# Patient Record
Sex: Female | Born: 1995 | Race: Black or African American | Hispanic: No | Marital: Single | Smoking: Never smoker
Health system: Southern US, Community
[De-identification: ages and names within clinical notes are randomized; demographics above are authoritative.]

## PROBLEM LIST (undated history)

## (undated) ENCOUNTER — Emergency Department (HOSPITAL_COMMUNITY): Admission: EM | Discharge status: Absent without leave | Payer: Self-pay

## (undated) ENCOUNTER — Inpatient Hospital Stay (HOSPITAL_COMMUNITY): Payer: Self-pay

## (undated) DIAGNOSIS — B999 Unspecified infectious disease: Secondary | ICD-10-CM

## (undated) DIAGNOSIS — L309 Dermatitis, unspecified: Secondary | ICD-10-CM

## (undated) DIAGNOSIS — A749 Chlamydial infection, unspecified: Secondary | ICD-10-CM

## (undated) DIAGNOSIS — F329 Major depressive disorder, single episode, unspecified: Secondary | ICD-10-CM

## (undated) DIAGNOSIS — R51 Headache: Principal | ICD-10-CM

## (undated) DIAGNOSIS — E0500 Thyrotoxicosis with diffuse goiter without thyrotoxic crisis or storm: Secondary | ICD-10-CM

## (undated) DIAGNOSIS — F32A Depression, unspecified: Secondary | ICD-10-CM

## (undated) DIAGNOSIS — J45909 Unspecified asthma, uncomplicated: Secondary | ICD-10-CM

## (undated) DIAGNOSIS — F419 Anxiety disorder, unspecified: Secondary | ICD-10-CM

## (undated) DIAGNOSIS — R519 Headache, unspecified: Secondary | ICD-10-CM

## (undated) DIAGNOSIS — A6000 Herpesviral infection of urogenital system, unspecified: Secondary | ICD-10-CM

## (undated) HISTORY — DX: Headache: R51

## (undated) HISTORY — PX: OTHER SURGICAL HISTORY: SHX169

## (undated) HISTORY — PX: NO PAST SURGERIES: SHX2092

## (undated) HISTORY — PX: WISDOM TOOTH EXTRACTION: SHX21

## (undated) HISTORY — DX: Headache, unspecified: R51.9

---
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
## 2014-02-17 ENCOUNTER — Telehealth: Payer: Self-pay | Admitting: Genetic Counselor

## 2014-02-17 NOTE — Telephone Encounter (Signed)
LEFT MESSAGE FOR PATIENT TO RETURN CALL TO SCHEDULE GENETIC APPT.  °

## 2014-02-17 NOTE — Telephone Encounter (Signed)
S/W PATIENT AND GVE GENETIC APPT FOR 05/29 @ 10 W/GENETIC COUNSELOR.  WELCOME PACKET MAILED.

## 2014-02-19 ENCOUNTER — Emergency Department (HOSPITAL_COMMUNITY)
Admission: EM | Admit: 2014-02-19 | Discharge: 2014-02-19 | Disposition: A | Discharge status: Home / Self Care | Payer: BC Managed Care – PPO | Attending: Emergency Medicine | Admitting: Emergency Medicine

## 2014-02-19 ENCOUNTER — Encounter (HOSPITAL_COMMUNITY): Payer: Self-pay | Admitting: Emergency Medicine

## 2014-02-19 ENCOUNTER — Emergency Department (HOSPITAL_COMMUNITY): Payer: BC Managed Care – PPO

## 2014-02-19 DIAGNOSIS — J45901 Unspecified asthma with (acute) exacerbation: Secondary | ICD-10-CM | POA: Diagnosis not present

## 2014-02-19 DIAGNOSIS — Z880 Allergy status to penicillin: Secondary | ICD-10-CM | POA: Insufficient documentation

## 2014-02-19 DIAGNOSIS — R0602 Shortness of breath: Secondary | ICD-10-CM | POA: Diagnosis present

## 2014-02-19 HISTORY — DX: Unspecified asthma, uncomplicated: J45.909

## 2014-02-19 LAB — CBC
HEMATOCRIT: 37.6 % (ref 36.0–46.0)
HEMOGLOBIN: 12.8 g/dL (ref 12.0–15.0)
MCH: 27.6 pg (ref 26.0–34.0)
MCHC: 34 g/dL (ref 30.0–36.0)
MCV: 81.2 fL (ref 78.0–100.0)
Platelets: 244 10*3/uL (ref 150–400)
RBC: 4.63 MIL/uL (ref 3.87–5.11)
RDW: 13.3 % (ref 11.5–15.5)
WBC: 5.9 10*3/uL (ref 4.0–10.5)

## 2014-02-19 LAB — BASIC METABOLIC PANEL
BUN: 11 mg/dL (ref 6–23)
CALCIUM: 9.6 mg/dL (ref 8.4–10.5)
CO2: 23 mEq/L (ref 19–32)
Chloride: 104 mEq/L (ref 96–112)
Creatinine, Ser: 0.67 mg/dL (ref 0.50–1.10)
GLUCOSE: 99 mg/dL (ref 70–99)
POTASSIUM: 3.7 meq/L (ref 3.7–5.3)
Sodium: 141 mEq/L (ref 137–147)

## 2014-02-19 LAB — I-STAT TROPONIN, ED: Troponin i, poc: 0 ng/mL (ref 0.00–0.08)

## 2014-02-19 MED ORDER — PREDNISONE 20 MG PO TABS
60.0000 mg | ORAL_TABLET | Freq: Once | ORAL | Status: AC
  Administered 2014-02-19: 60 mg via ORAL
  Filled 2014-02-19: qty 3

## 2014-02-19 MED ORDER — ALBUTEROL SULFATE (2.5 MG/3ML) 0.083% IN NEBU
5.0000 mg | INHALATION_SOLUTION | Freq: Once | RESPIRATORY_TRACT | Status: AC
  Administered 2014-02-19: 5 mg via RESPIRATORY_TRACT
  Filled 2014-02-19: qty 6

## 2014-02-19 MED ORDER — PREDNISONE 10 MG PO TABS: 60.0000 mg | ORAL_TABLET | Freq: Every day | ORAL | Status: DC

## 2014-02-19 MED ORDER — IPRATROPIUM BROMIDE 0.02 % IN SOLN
0.5000 mg | Freq: Once | RESPIRATORY_TRACT | Status: AC
  Administered 2014-02-19: 0.5 mg via RESPIRATORY_TRACT
  Filled 2014-02-19: qty 2.5

## 2014-02-19 MED ORDER — ALBUTEROL SULFATE HFA 108 (90 BASE) MCG/ACT IN AERS
2.0000 | INHALATION_SPRAY | RESPIRATORY_TRACT | Status: DC
  Administered 2014-02-19: 2 via RESPIRATORY_TRACT
  Filled 2014-02-19: qty 6.7

## 2014-02-19 NOTE — Discharge Instructions (Signed)

## 2014-02-19 NOTE — ED Notes (Signed)
Pt reports about 1 hour ago she began to have sob and chest pain. No noted activity at onset. She is alert and breathing easily now

## 2014-02-19 NOTE — ED Provider Notes (Signed)
CSN: [id number]     Arrival date & time 02/19/14  1504 History   First MD Initiated Contact with Patient 02/19/14 1709     Chief Complaint  Patient presents with  . Shortness of Breath      HPI Patient presents to the emergency department with increasing shortness of breath and chest tightness over the past 24-48 hours.  She reports recent coughing congestion.  She denies fevers and chills.  She has a long-standing history of asthma and states this feels like asthma exacerbation.  She does not have a rescue inhaler at home.  She has not received any treatments at this point.  No history of heart disease.  No history of DVT or pulmonary embolism.  No family history of venous thromboembolic disease.   Past Medical History  Diagnosis Date  . Asthma    History reviewed. No pertinent past surgical history. History reviewed. No pertinent family history. History  Substance Use Topics  . Smoking status: Never Smoker   . Smokeless tobacco: Not on file  . Alcohol Use: No   OB History   Grav Para Term Preterm Abortions TAB SAB Ect Mult Living                 Review of Systems  All other systems reviewed and are negative.     Allergies  Amoxicillin  Home Medications   Prior to Admission medications   Medication Sig Start Date End Date Taking? Authorizing Provider  ibuprofen (ADVIL,MOTRIN) 200 MG tablet Take 400 mg by mouth every 6 (six) hours as needed for headache.   Yes Historical Provider, MD   BP 130/90  Pulse 110  Temp(Src) 98.8 F (37.1 C) (Oral)  Resp 16  Ht 5\' 5"  (1.651 m)  Wt 118 lb (53.524 kg)  BMI 19.64 kg/m2  SpO2 100% Physical Exam  Nursing note and vitals reviewed. Constitutional: She is oriented to person, place, and time. She appears well-developed and well-nourished. No distress.  HENT:  Head: Normocephalic and atraumatic.  Eyes: EOM are normal.  Neck: Normal range of motion.  Cardiovascular: Normal rate, regular rhythm and normal heart sounds.    Pulmonary/Chest: Effort normal. She has wheezes.  Abdominal: Soft. She exhibits no distension. There is no tenderness.  Musculoskeletal: Normal range of motion.  Neurological: She is alert and oriented to person, place, and time.  Skin: Skin is warm and dry.  Psychiatric: She has a normal mood and affect. Judgment normal.    ED Course  Procedures (including critical care time) Labs Review Labs Reviewed  CBC  BASIC METABOLIC PANEL  I-STAT TROPOININ, ED    Imaging Review Dg Chest 2 View  02/19/2014   CLINICAL DATA:  Chest pain, short of breath, asthma  EXAM: CHEST  2 VIEW  COMPARISON:  None.  FINDINGS: Diffuse hyperinflation noted. Normal heart size and vascularity. No focal pneumonia, collapse or consolidation. No edema, effusion or pneumothorax. Trachea midline.  IMPRESSION: Hyperinflation.  No acute process.   Electronically Signed   By: Ruel Favorsrevor  Shick M.D.   On: 02/19/2014 15:50  I personally reviewed the imaging tests through PACS system I reviewed available ER/hospitalization records through the EMR    EKG Interpretation None      MDM   Final diagnoses:  None    7:29 PM  Vision feels much better this time.  Discharge home.  Asthma exacerbation.   [name], MD 02/19/14 [phone]

## 2014-02-26 ENCOUNTER — Emergency Department (HOSPITAL_COMMUNITY): Payer: BC Managed Care – PPO

## 2014-02-26 ENCOUNTER — Emergency Department (HOSPITAL_COMMUNITY)
Admission: EM | Admit: 2014-02-26 | Discharge: 2014-02-26 | Disposition: A | Discharge status: Home / Self Care | Payer: BC Managed Care – PPO | Attending: Emergency Medicine | Admitting: Emergency Medicine

## 2014-02-26 ENCOUNTER — Encounter (HOSPITAL_COMMUNITY): Payer: Self-pay | Admitting: Emergency Medicine

## 2014-02-26 DIAGNOSIS — Z880 Allergy status to penicillin: Secondary | ICD-10-CM | POA: Insufficient documentation

## 2014-02-26 DIAGNOSIS — J45909 Unspecified asthma, uncomplicated: Secondary | ICD-10-CM | POA: Insufficient documentation

## 2014-02-26 DIAGNOSIS — Z791 Long term (current) use of non-steroidal anti-inflammatories (NSAID): Secondary | ICD-10-CM | POA: Insufficient documentation

## 2014-02-26 DIAGNOSIS — R51 Headache: Secondary | ICD-10-CM | POA: Insufficient documentation

## 2014-02-26 DIAGNOSIS — IMO0002 Reserved for concepts with insufficient information to code with codable children: Secondary | ICD-10-CM | POA: Insufficient documentation

## 2014-02-26 DIAGNOSIS — R079 Chest pain, unspecified: Secondary | ICD-10-CM | POA: Insufficient documentation

## 2014-02-26 DIAGNOSIS — R519 Headache, unspecified: Secondary | ICD-10-CM

## 2014-02-26 MED ORDER — KETOROLAC TROMETHAMINE 60 MG/2ML IM SOLN
60.0000 mg | Freq: Once | INTRAMUSCULAR | Status: AC
  Administered 2014-02-26: 60 mg via INTRAMUSCULAR
  Filled 2014-02-26: qty 2

## 2014-02-26 MED ORDER — NAPROXEN 500 MG PO TABS: 500.0000 mg | ORAL_TABLET | Freq: Two times a day (BID) | ORAL | Status: DC

## 2014-02-26 MED ORDER — ONDANSETRON 4 MG PO TBDP
4.0000 mg | ORAL_TABLET | Freq: Once | ORAL | Status: AC
  Administered 2014-02-26: 4 mg via ORAL
  Filled 2014-02-26: qty 1

## 2014-02-26 NOTE — ED Provider Notes (Signed)
Date: 02/26/2014  Rate: 78  Rhythm: normal sinus rhythm  QRS Axis: normal  Intervals: normal  ST/T Wave abnormalities: nonspecific T wave changes  Conduction Disutrbances:none  Narrative Interpretation:   Old EKG Reviewed: questionable inferolateralal ischemic t wave changes sen on last tracing have resolved   Doug SouSam Mckinna Demars, MD 02/26/14 1657

## 2014-02-26 NOTE — ED Notes (Signed)
Pt report chest pain and points to area just under left breast and hurts that is worse with deep breath at 7/10. Denies fall or injury. Pt also reports headache x 1 month at 10/10. Pt in NAD. Pt denies SOB and n/v/d.

## 2014-02-26 NOTE — ED Provider Notes (Signed)
Medical screening examination/treatment/procedure(s) were performed by non-physician practitioner and as supervising physician I was immediately available for consultation/collaboration.   EKG Interpretation   Date/Time:  Wednesday February 26 2014 10:09:35 EDT Ventricular Rate:  76 PR Interval:  146 QRS Duration: 80 QT Interval:  360 QTC Calculation: 405 R Axis:   65 Text Interpretation:  Sinus rhythm Baseline wander in lead(s) V3  Nonspecific T wave abnormality seen on last tracing have resolved  Confirmed by Ethelda ChickJACUBOWITZ  MD, Charisma Charlot [phone]) on 02/26/2014 4:55:51 PM       Doug SouSam Tongela Encinas, MD 02/26/14 1657

## 2014-02-26 NOTE — Discharge Instructions (Signed)
Take the prescribed medication as directed, may re-start prednisone as well. Follow-up with [facility] if headaches continue or worsen. Return to the ED for new or worsening symptoms.

## 2014-02-26 NOTE — ED Provider Notes (Signed)
CSN: [id number]     Arrival date & time 02/26/14  0900 History   First MD Initiated Contact with Patient 02/26/14 0915     Chief Complaint  Patient presents with  . Headache  . cheat pain      (Consider location/radiation/quality/duration/timing/severity/associated sxs/prior Treatment) Patient is a 18 y.o. female presenting with headaches. The history is provided by the patient and medical records.  Headache  This is an 18 year old female with past medical history significant for asthma presenting to the ED for chest pain and headache. Patient states headache has been present intermittently for the past month, localized to her left temple and described as a throbbing sensation. She denies any associated photophobia, phonophobia, aura, neck pain, dizziness, tinnitus, visual disturbance, changes in speech, or gait disturbance.  Patient also complains of some chest pain, localized directly beneath her left breast. States this is worse with movement and deep breathing.  No chest wall trauma.  She denies any shortness of breath, cough, congestion, fever, or chills.  No personal or family cardiac history. No recent travel, surgeries, prolonged immobilization, calf pain, LE swelling, or exogenous estrogens. Patient has no prior history of DVT or PE. Patient was seen in the ED 1 week ago for similar symptoms, was started on prednisone but states she thinks it made her headache worse so she has stopped taking it.  She has not taken any medications for her headache thus far.  VS stable on arrival.  Past Medical History  Diagnosis Date  . Asthma    History reviewed. No pertinent past surgical history. No family history on file. History  Substance Use Topics  . Smoking status: Never Smoker   . Smokeless tobacco: Not on file  . Alcohol Use: No   OB History   Grav Para Term Preterm Abortions TAB SAB Ect Mult Living                 Review of Systems  Cardiovascular: Positive for chest pain.    Neurological: Positive for headaches.  All other systems reviewed and are negative.     Allergies  Amoxicillin  Home Medications   Prior to Admission medications   Medication Sig Start Date End Date Taking? Authorizing Provider  ibuprofen (ADVIL,MOTRIN) 200 MG tablet Take 400 mg by mouth every 6 (six) hours as needed for headache.    Historical Provider, MD  predniSONE (DELTASONE) 10 MG tablet Take 6 tablets (60 mg total) by mouth daily. 02/19/14   Lyanne CoKevin M Campos, MD   BP 123/76  Pulse 92  Temp(Src) 99.1 F (37.3 C) (Oral)  Resp 20  SpO2 100%  Physical Exam  Nursing note and vitals reviewed. Constitutional: She is oriented to person, place, and time. She appears well-developed and well-nourished. No distress.  Sitting upright, all lights on, texting on cell phone  HENT:  Head: Normocephalic and atraumatic.  Mouth/Throat: Oropharynx is clear and moist.  Eyes: Conjunctivae and EOM are normal. Pupils are equal, round, and reactive to light.  Neck: Normal range of motion and full passive range of motion without pain. Neck supple. No spinous process tenderness and no muscular tenderness present. No rigidity.  No meningeal signs  Cardiovascular: Normal rate, regular rhythm and normal heart sounds.   Pulmonary/Chest: Effort normal and breath sounds normal. No respiratory distress. She has no rales. She exhibits tenderness.    Chest pain reproducible with palpation to the left anterior chest wall, below left breast  Abdominal: Soft. Bowel sounds are normal. There is  no tenderness. There is no guarding.  Musculoskeletal: Normal range of motion.  Neurological: She is alert and oriented to person, place, and time. She has normal strength. She displays no tremor. No cranial nerve deficit or sensory deficit. She displays no seizure activity.  No focal neuro deficits appreciated  Skin: Skin is warm and dry. She is not diaphoretic.  Psychiatric: She has a normal mood and affect.    ED  Course  Procedures (including critical care time)   Date: 02/26/2014  Rate: 76  Rhythm: normal sinus rhythm  QRS Axis: normal  Intervals: normal  ST/T Wave abnormalities: normal  Conduction Disutrbances:none  Narrative Interpretation:   Old EKG Reviewed: unchanged   Labs Review Labs Reviewed - No data to display  Imaging Review Dg Chest 2 View  02/26/2014   CLINICAL DATA:  Left-sided chest pain for 1 week.  Headache.  EXAM: CHEST  2 VIEW  COMPARISON:  02/19/2014  FINDINGS: The lungs again appear mildly hyperinflated. No airspace consolidation, edema, pleural effusion, or pneumothorax is identified. Cardiomediastinal silhouette is within normal limits. Osseous structures are unremarkable.  IMPRESSION: Hyperinflation.  No evidence of acute airspace disease.   Electronically Signed   By: Sebastian AcheAllen  Grady   On: 02/26/2014 09:54     EKG Interpretation None      MDM   Final diagnoses:  Chest pain  Headache   18 year old female with complaint of chest pain and headache. Seen one week ago in the ED for the same with a negative workup at that time-- i personally reviewed labs and imaging studies. On exam, patient's chest pain history producible with palpation to the left anterior chest wall, her lungs are clear and she has no signs of respiratory distress. Patient is PERC negative.  Will obtain screening EKG and CXR.  Headache without focal neuro deficits and pt is AAOx3 currently texting on her cell phone-- low suspicion for TIA, stroke, ICH, SAH, or meningitis, Toradol given.  Will reassess.  Patient reassessed. Headache is greatly improved after Toradol. Her neuro exam remains intact. Given the reproducible nature of her ongoing chest pain over the past week, have low suspicion for ACS, PE, dissection, or other acute cardiac event. Symptoms likely musculoskeletal in nature. Patient was started on anti-inflammatories, and should she may continue prednisone from previous visit.  She will  follow up with neurology for further management of migraine headaches.  Garlon HatchetLisa M Sanders, PA-C 02/26/14 1322

## 2014-03-10 ENCOUNTER — Ambulatory Visit (INDEPENDENT_AMBULATORY_CARE_PROVIDER_SITE_OTHER): Payer: BC Managed Care – PPO | Admitting: Neurology

## 2014-03-10 ENCOUNTER — Encounter: Payer: Self-pay | Admitting: Neurology

## 2014-03-10 VITALS — Ht 65.0 in | Wt 122.0 lb

## 2014-03-10 DIAGNOSIS — G43909 Migraine, unspecified, not intractable, without status migrainosus: Secondary | ICD-10-CM

## 2014-03-10 DIAGNOSIS — R519 Headache, unspecified: Secondary | ICD-10-CM | POA: Insufficient documentation

## 2014-03-10 DIAGNOSIS — R51 Headache: Secondary | ICD-10-CM

## 2014-03-10 HISTORY — DX: Migraine, unspecified, not intractable, without status migrainosus: G43.909

## 2014-03-10 MED ORDER — NORTRIPTYLINE HCL 10 MG PO CAPS: ORAL_CAPSULE | ORAL | Status: DC

## 2014-03-10 MED ORDER — RIZATRIPTAN BENZOATE 5 MG PO TBDP: 5.0000 mg | ORAL_TABLET | ORAL | Status: DC | PRN

## 2014-03-10 NOTE — Progress Notes (Signed)
PATIENT: Rebekah Rhodes DOB: 08/04/1996  HISTORICAL  Rebekah Rhodes is 18 years old right-handed African American female, is accompanied by her sister, referred by emergency room, and primary care physician Dr. Jenne PaneBates for evaluation of frequent headaches  She presented to the emergency room twice in April, April 20 second, with chest pain, April 20 ninth 2015 with chest pain, and headaches, laboratory evaluation including troponin, BMP, CBC was normal, chest x-ray showed hyperinflation.  She had history of headache for 2 years, most left temporal regions,   She had headache daily for 2 months, since March 2015, 4/10, it can go to 10/10, daily, in the morning, lasting 3-4 hours, she has tried Advil, ibuprofen, Tylenol without helping, she is taking 2 tabs - 4 tabs daily for 2 months. Lying down in dark quiet room helps her headaches,  She has no visual loss, trigger for her headaches are light, noise, being hungry, sleep depravation,  There was no family history of headaches,   REVIEW OF SYSTEMS: Full 14 system review of systems performed and notable only for chest pain, blurry vision, left eye pain, energy, skin sensitivity, headache, dizziness, sleepiness, too much sleep,  ALLERGIES: Allergies  Allergen Reactions  . Amoxicillin     Childhood allergy    HOME MEDICATIONS: Current Outpatient Prescriptions on File Prior to Visit  Medication Sig Dispense Refill  . cetirizine (ZYRTEC) 10 MG tablet Take 10 mg by mouth daily.      . fluticasone (FLONASE) 50 MCG/ACT nasal spray Place 1 spray into both nostrils daily.      Marland Kitchen. ibuprofen (ADVIL,MOTRIN) 200 MG tablet Take 400 mg by mouth every 6 (six) hours as needed for headache.      . naproxen (NAPROSYN) 500 MG tablet Take 1 tablet (500 mg total) by mouth 2 (two) times daily with a meal.  30 tablet  0  . Olopatadine HCl (PATADAY) 0.2 % SOLN Place 1 drop into both eyes daily.      . predniSONE (DELTASONE) 10 MG tablet Take 6 tablets (60  mg total) by mouth daily.  30 tablet  0   PAST MEDICAL HISTORY: Past Medical History  Diagnosis Date  . Asthma   . HA (headache)     PAST SURGICAL HISTORY: Past Surgical History  Procedure Laterality Date  . None      FAMILY HISTORY: Family History  Problem Relation Age of Onset  . Breast cancer Mother     SOCIAL HISTORY:  History   Social History  . Marital Status: Single    Spouse Name: N/A    Number of Children: 0  . Years of Education: 12   Occupational History    High school student   Social History Main Topics  . Smoking status: Never Smoker   . Smokeless tobacco: Never Used  . Alcohol Use: No  . Drug Use: No  . Sexual Activity: Not on file   Other Topics Concern  . Not on file   Social History Narrative   Patient lives at home with her father Clement Sayres(James Javier).   Patient is a high [employer] student.   Right handed.   Caffeine soda one daily.     PHYSICAL EXAM   Filed Vitals:   03/10/14 1427  Height: 5\' 5"  (1.651 m)  Weight: 122 lb (55.339 kg)    Not recorded    Body mass index is 20.3 kg/(m^2).   Generalized: In no acute distress  Neck: Supple, no carotid bruits  Cardiac: Regular rate rhythm  Pulmonary: Clear to auscultation bilaterally  Musculoskeletal: No deformity  Neurological examination  Mentation: Alert oriented to time, place, history taking, and causual conversation  Cranial nerve II-XII: Pupils were equal round reactive to light. Extraocular movements were full.  Visual field were full on confrontational test. Bilateral fundi were sharp.  Facial sensation and strength were normal. Hearing was intact to finger rubbing bilaterally. Uvula tongue midline.  Head turning and shoulder shrug and were normal and symmetric.Tongue protrusion into cheek strength was normal.  Motor: Normal tone, bulk and strength.  Sensory: Intact to fine touch, pinprick, preserved vibratory sensation, and proprioception at toes.  Coordination: Normal  finger to nose, heel-to-shin bilaterally there was no truncal ataxia  Gait: Rising up from seated position without assistance, normal stance, without trunk ataxia, moderate stride, good arm swing, smooth turning, able to perform tiptoe, and heel walking without difficulty.   Romberg signs: Negative  Deep tendon reflexes: Brachioradialis 2/2, biceps 2/2, triceps 2/2, patellar 2/2, Achilles 2/2, plantar responses were flexor bilaterally.   DIAGNOSTIC DATA (LABS, IMAGING, TESTING) - I reviewed patient records, labs, notes, testing and imaging myself where available.  Lab Results  Component Value Date   WBC 5.9 02/19/2014   HGB 12.8 02/19/2014   HCT 37.6 02/19/2014   MCV 81.2 02/19/2014   PLT 244 02/19/2014      Component Value Date/Time   NA 141 02/19/2014 1513   K 3.7 02/19/2014 1513   CL 104 02/19/2014 1513   CO2 23 02/19/2014 1513   GLUCOSE 99 02/19/2014 1513   BUN 11 02/19/2014 1513   CREATININE 0.67 02/19/2014 1513   CALCIUM 9.6 02/19/2014 1513   GFRNONAA >90 02/19/2014 1513   GFRAA >90 02/19/2014 1513     ASSESSMENT AND PLAN  [name] is a 18 y.o. female complains of  frequent headaches, consistent with migraines,  1, nortriptyline 10 mg, titrating to 20 mg every night as preventive medications 2. Maxalt as needed 3. Return to clinic in 3 months with Gerlene Feearolyn   Jeanine Caven, M.D. Ph.D.  [facility] [address], [address] [place], [place] [address] [phone]

## 2014-03-10 NOTE — Patient Instructions (Signed)
Magnesium oxide 400 mg one tablet twice a day  Riboflavin 100 mg one tablet twice a day

## 2014-03-28 ENCOUNTER — Ambulatory Visit: Payer: Medicaid Other

## 2014-07-01 ENCOUNTER — Telehealth: Payer: Self-pay | Admitting: Nurse Practitioner

## 2014-07-01 ENCOUNTER — Ambulatory Visit: Payer: Medicaid Other | Admitting: Nurse Practitioner

## 2014-07-01 NOTE — Telephone Encounter (Signed)
No show for scheduled appointment 

## 2014-07-12 ENCOUNTER — Emergency Department (INDEPENDENT_AMBULATORY_CARE_PROVIDER_SITE_OTHER): Payer: BC Managed Care – PPO

## 2014-07-12 ENCOUNTER — Encounter (HOSPITAL_COMMUNITY): Payer: Self-pay | Admitting: Emergency Medicine

## 2014-07-12 ENCOUNTER — Emergency Department (INDEPENDENT_AMBULATORY_CARE_PROVIDER_SITE_OTHER)
Admission: EM | Admit: 2014-07-12 | Discharge: 2014-07-12 | Disposition: A | Discharge status: Home / Self Care | Payer: BC Managed Care – PPO | Source: Home / Self Care | Attending: Emergency Medicine | Admitting: Emergency Medicine

## 2014-07-12 DIAGNOSIS — IMO0002 Reserved for concepts with insufficient information to code with codable children: Secondary | ICD-10-CM

## 2014-07-12 DIAGNOSIS — S6721XA Crushing injury of right hand, initial encounter: Secondary | ICD-10-CM

## 2014-07-12 DIAGNOSIS — S6720XA Crushing injury of unspecified hand, initial encounter: Secondary | ICD-10-CM

## 2014-07-12 MED ORDER — IBUPROFEN 800 MG PO TABS
800.0000 mg | ORAL_TABLET | Freq: Once | ORAL | Status: AC
  Administered 2014-07-12: 800 mg via ORAL

## 2014-07-12 MED ORDER — TRAMADOL HCL 50 MG PO TABS: 100.0000 mg | ORAL_TABLET | Freq: Three times a day (TID) | ORAL | Status: DC | PRN

## 2014-07-12 MED ORDER — IBUPROFEN 800 MG PO TABS
ORAL_TABLET | ORAL | Status: AC
  Filled 2014-07-12: qty 1

## 2014-07-12 MED ORDER — MELOXICAM 15 MG PO TABS: 15.0000 mg | ORAL_TABLET | Freq: Every day | ORAL | Status: DC

## 2014-07-12 NOTE — ED Provider Notes (Signed)
  Chief Complaint   Hand Pain   History of Present Illness   Rebekah Rhodes is an 18 year old female who slammed her right hand in a door at school yesterday. The hand is swollen, but there is no bruising. There is tenderness and pain to palpation over the palm of the hand and over all the MCP joints. She has a full range of motion of all her joints, but it hurts. She denies a numbness or tingling. She denies any pain in the phalanges or the wrist.  Review of Systems   Other than as noted above, the patient denies any of the following symptoms: Systemic:  No fevers or chills. Musculoskeletal:  No joint pain or arthritis.  Neurological:  No muscular weakness or paresthesias.  PMFSH   Past medical history, family history, social history, meds, and allergies were reviewed.   She is allergic to amoxicillin.  Physical Examination   Vital signs:  BP 120/79  Pulse 77  Temp(Src) 98.2 F (36.8 C) (Oral)  Resp 16  SpO2 99%  LMP 07/05/2014 Gen:  Alert and in no distress. Musculoskeletal:  Exam of the hand reveals no swelling, bruising, or deformity. There is pain to palpation over the palm of the right hand and over the MCP joints, she was able to fully flex and fully extend at the MCP joints but with pain.  Otherwise, all joints had a full a ROM with no swelling, bruising or deformity.  No edema, pulses full. Extremities were warm and pink.  Capillary refill was brisk.  Skin:  Clear, warm and dry.  No rash. Neuro:  Alert and oriented.  Muscle strength was normal.  Sensation was intact to light touch.   Radiology   Dg Hand Complete Right  07/12/2014   CLINICAL DATA:  Crushed right hand in car door 1 day ago  EXAM: RIGHT HAND - COMPLETE 3+ VIEW  COMPARISON:  None.  FINDINGS: There is no evidence of fracture or dislocation. There is no evidence of arthropathy or other focal bone abnormality. Soft tissues are unremarkable.  IMPRESSION: Negative.   Electronically Signed   By: Signa Kell  M.D.   On: 07/12/2014 19:29    I reviewed the images independently and personally and concur with the radiologist's findings.  Course in Urgent [facility] Center   She was placed in a wrist splint.  Assessment   The encounter diagnosis was Crushing injury of right hand, initial encounter.  No evidence of fracture.  Plan  1.  Meds:  The following meds were prescribed:   New Prescriptions   MELOXICAM (MOBIC) 15 MG TABLET    Take 1 tablet (15 mg total) by mouth daily.   TRAMADOL (ULTRAM) 50 MG TABLET    Take 2 tablets (100 mg total) by mouth every 8 (eight) hours as needed.    2.  Patient Education/Counseling:  The patient was given appropriate handouts, self care instructions, and instructed in symptomatic relief, including rest and activity, and elevation.   3.  Follow up:  The patient was told to follow up here if no better in 3 to 4 days, or sooner if becoming worse in any way, and given some red flag symptoms such as worsening pain, fever, swelling, or neurological symptoms which would prompt immediate return.        Reuben Likes, MD 07/12/14 [phone]

## 2014-07-12 NOTE — Discharge Instructions (Signed)
Crush Injury, Fingers or Toes °A crush injury to the fingers or toes means the tissues have been damaged by being squeezed (compressed). There will be bleeding into the tissues and swelling. Often, blood will collect under the skin. When this happens, the skin on the finger often dies and may slough off (shed) 1 week to 10 days later. Usually, new skin is growing underneath. If the injury has been too severe and the tissue does not survive, the damaged tissue may begin to turn black over several days.  °Wounds which occur because of the crushing may be stitched (sutured) shut. However, crush injuries are more likely to become infected than other injuries. These wounds may not be closed as tightly as other types of cuts to prevent infection. Nails involved are often lost. These usually grow back over several weeks.  °DIAGNOSIS °X-rays may be taken to see if there is any injury to the bones. °TREATMENT °Broken bones (fractures) may be treated with splinting, depending on the fracture. Often, no treatment is required for fractures of the last bone in the fingers or toes. °HOME CARE INSTRUCTIONS  °· The crushed part should be raised (elevated) above the heart or center of the chest as much as possible for the first several days or as directed. This helps with pain and lessens swelling. Less swelling increases the chances that the crushed part will survive. °· Put ice on the injured area. °¨ Put ice in a plastic bag. °¨ Place a towel between your skin and the bag. °¨ Leave the ice on for 15-20 minutes, 03-04 times a day for the first 2 days. °· Only take over-the-counter or prescription medicines for pain, discomfort, or fever as directed by your caregiver. °· Use your injured part only as directed. °· Change your bandages (dressings) as directed. °· Keep all follow-up appointments as directed by your caregiver. Not keeping your appointment could result in a chronic or permanent injury, pain, and disability. If there is  any problem keeping the appointment, you must call to reschedule. °SEEK IMMEDIATE MEDICAL CARE IF:  °· There is redness, swelling, or increasing pain in the wound area. °· Pus is coming from the wound. °· You have a fever. °· You notice a bad smell coming from the wound or dressing. °· The edges of the wound do not stay together after the sutures have been removed. °· You are unable to move the injured finger or toe. °MAKE SURE YOU:  °· Understand these instructions. °· Will watch your condition. °· Will get help right away if you are not doing well or get worse. °Document Released: 10/17/2005 Document Revised: 01/09/2012 Document Reviewed: 03/04/2011 °ExitCare® Patient Information ©2015 [employer]. This information is not intended to replace advice given to you by your health care provider. Make sure you discuss any questions you have with your health care provider. ° °

## 2014-07-12 NOTE — ED Notes (Signed)
Right hand pain, reports slamming right hand in a house door yesterday and now having pain across knuckles.

## 2014-10-21 ENCOUNTER — Emergency Department (HOSPITAL_COMMUNITY)
Admission: EM | Admit: 2014-10-21 | Discharge: 2014-10-22 | Disposition: A | Discharge status: Home / Self Care | Payer: BC Managed Care – PPO | Attending: Emergency Medicine | Admitting: Emergency Medicine

## 2014-10-21 DIAGNOSIS — R45851 Suicidal ideations: Secondary | ICD-10-CM | POA: Diagnosis present

## 2014-10-21 DIAGNOSIS — F329 Major depressive disorder, single episode, unspecified: Secondary | ICD-10-CM

## 2014-10-21 DIAGNOSIS — Z79899 Other long term (current) drug therapy: Secondary | ICD-10-CM | POA: Insufficient documentation

## 2014-10-21 DIAGNOSIS — Z7952 Long term (current) use of systemic steroids: Secondary | ICD-10-CM | POA: Diagnosis not present

## 2014-10-21 DIAGNOSIS — J45909 Unspecified asthma, uncomplicated: Secondary | ICD-10-CM | POA: Insufficient documentation

## 2014-10-21 DIAGNOSIS — F32A Depression, unspecified: Secondary | ICD-10-CM

## 2014-10-21 DIAGNOSIS — Z880 Allergy status to penicillin: Secondary | ICD-10-CM | POA: Insufficient documentation

## 2014-10-21 DIAGNOSIS — Z791 Long term (current) use of non-steroidal anti-inflammatories (NSAID): Secondary | ICD-10-CM | POA: Diagnosis not present

## 2014-10-21 DIAGNOSIS — Z7951 Long term (current) use of inhaled steroids: Secondary | ICD-10-CM | POA: Diagnosis not present

## 2014-10-21 MED ORDER — NICOTINE 21 MG/24HR TD PT24: 21.0000 mg | MEDICATED_PATCH | Freq: Every day | TRANSDERMAL | Status: DC

## 2014-10-21 MED ORDER — ACETAMINOPHEN 325 MG PO TABS: 650.0000 mg | ORAL_TABLET | ORAL | Status: DC | PRN

## 2014-10-21 MED ORDER — LORAZEPAM 1 MG PO TABS: 1.0000 mg | ORAL_TABLET | Freq: Three times a day (TID) | ORAL | Status: DC | PRN

## 2014-10-21 MED ORDER — IBUPROFEN 200 MG PO TABS
600.0000 mg | ORAL_TABLET | Freq: Three times a day (TID) | ORAL | Status: DC | PRN
  Filled 2014-10-21: qty 3

## 2014-10-21 MED ORDER — ZOLPIDEM TARTRATE 5 MG PO TABS: 5.0000 mg | ORAL_TABLET | Freq: Every evening | ORAL | Status: DC | PRN

## 2014-10-21 NOTE — ED Provider Notes (Signed)
CSN: [id number]     Arrival date & time 10/21/14  2347 History  This chart was scribed for a non-physician practitioner, Arman FilterGail K Caree Wolpert, NP working with Lyanne CoKevin M Campos, MD by [place] Peace, ED Scribe. The patient was seen in [facility]. The patient's care was started at 11:53 PM.    Chief Complaint  Patient presents with  . Suicidal      The history is provided by the patient. No language interpreter was used.   HPI Comments: Rebekah Rhodes is a 18 y.o. female who presents to the Emergency Department complaining of suicidal ideations onset tonight. Pt states that she is going through a lot with her family and school. 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 She notes that she has been arguing a lot with her mother and grandmother. Pt reports history of cutting herself on her arms in the past.  Pt denies previous hospitalizations for problem or following up with anyone about current issues. She denies any established ways on how to hurt herself but has been articulating about it.    Past Medical History  Diagnosis Date  . Asthma   . HA (headache)    Past Surgical History  Procedure Laterality Date  . None     Family History  Problem Relation Age of Onset  . Breast cancer Mother    History  Substance Use Topics  . Smoking status: Never Smoker   . Smokeless tobacco: Never Used  . Alcohol Use: No   OB History    No data available     Review of Systems  Psychiatric/Behavioral: Positive for suicidal ideas.  All other systems reviewed and are negative.     Allergies  Amoxicillin  Home Medications   Prior to Admission medications   Medication Sig Start Date End Date Taking? Authorizing Provider  cetirizine (ZYRTEC) 10 MG tablet Take 10 mg by mouth daily.    Historical Provider, MD  fluticasone (FLONASE) 50 MCG/ACT nasal spray Place 1 spray into both nostrils daily.    Historical Provider, MD  ibuprofen (ADVIL,MOTRIN) 200 MG tablet  Take 400 mg by mouth every 6 (six) hours as needed for headache.    Historical Provider, MD  meloxicam (MOBIC) 15 MG tablet Take 1 tablet (15 mg total) by mouth daily. 07/12/14   Reuben Likesavid C Keller, MD  naproxen (NAPROSYN) 500 MG tablet Take 1 tablet (500 mg total) by mouth 2 (two) times daily with a meal. 02/26/14   Garlon HatchetLisa M Sanders, PA-C  nortriptyline (PAMELOR) 10 MG capsule One tablet every night for one week, then 2 tablets every night 03/10/14   Levert FeinsteinYijun Yan, MD  Olopatadine HCl (PATADAY) 0.2 % SOLN Place 1 drop into both eyes daily.    Historical Provider, MD  predniSONE (DELTASONE) 10 MG tablet Take 6 tablets (60 mg total) by mouth daily. 02/19/14   Lyanne CoKevin M Campos, MD  rizatriptan (MAXALT-MLT) 5 MG disintegrating tablet Take 1 tablet (5 mg total) by mouth as needed for migraine. May repeat in 2 hours if needed 03/10/14   Levert FeinsteinYijun Yan, MD  traMADol (ULTRAM) 50 MG tablet Take 2 tablets (100 mg total) by mouth every 8 (eight) hours as needed. 07/12/14   Reuben Likesavid C Keller, MD   BP 121/81 mmHg  Pulse 84  Temp(Src) 98.3 F (36.8 C) (Oral)  Resp 22  SpO2 100% Physical Exam  ED Course  Procedures (including critical care time) Labs Review Labs Reviewed  CBC WITH DIFFERENTIAL -  Abnormal; Notable for the following:    Neutrophils Relative % 31 (*)    Neutro Abs 1.5 (*)    Lymphocytes Relative 55 (*)    All other components within normal limits  ETHANOL  URINE RAPID DRUG SCREEN (HOSP PERFORMED)  I-STAT CHEM 8, ED  POC URINE PREG, ED    Imaging Review No results found.   EKG Interpretation None     Medications  LORazepam (ATIVAN) tablet 1 mg (not administered)  acetaminophen (TYLENOL) tablet 650 mg (not administered)  ibuprofen (ADVIL,MOTRIN) tablet 600 mg (not administered)  zolpidem (AMBIEN) tablet 5 mg (not administered)  nicotine (NICODERM CQ - dosed in mg/24 hours) patch 21 mg (not administered)    11:55 PM- Treatment plan was discussed with patient who verbalizes understanding and agrees.    MDM  Patient has been assessed by TTS who recommends outpatient counseling.  They have given a list of resources.  Patient has signed a Engineer, manufacturing systemssafety contract.  She is being discharged in the custody of her father and sister Final diagnoses:  Depression       I personally performed the services described in this documentation, which was scribed in my presence. The recorded information has been reviewed and is accurate.   Arman FilterGail K Elicia Lui, NP 10/22/14 [phone]  Lyanne CoKevin M Campos, MD 10/22/14 [phone]

## 2014-10-22 ENCOUNTER — Encounter (HOSPITAL_COMMUNITY): Payer: Self-pay

## 2014-10-22 DIAGNOSIS — F329 Major depressive disorder, single episode, unspecified: Secondary | ICD-10-CM | POA: Diagnosis not present

## 2014-10-22 LAB — I-STAT CHEM 8, ED
BUN: 12 mg/dL (ref 6–23)
CALCIUM ION: 1.15 mmol/L (ref 1.12–1.23)
Chloride: 104 mEq/L (ref 96–112)
Creatinine, Ser: 0.7 mg/dL (ref 0.50–1.10)
Glucose, Bld: 88 mg/dL (ref 70–99)
HCT: 42 % (ref 36.0–46.0)
Hemoglobin: 14.3 g/dL (ref 12.0–15.0)
Potassium: 3.6 mmol/L (ref 3.5–5.1)
Sodium: 139 mmol/L (ref 135–145)
TCO2: 19 mmol/L (ref 0–100)

## 2014-10-22 LAB — CBC WITH DIFFERENTIAL/PLATELET
BASOS ABS: 0 10*3/uL (ref 0.0–0.1)
Basophils Relative: 1 % (ref 0–1)
Eosinophils Absolute: 0.1 10*3/uL (ref 0.0–0.7)
Eosinophils Relative: 3 % (ref 0–5)
HCT: 39.9 % (ref 36.0–46.0)
HEMOGLOBIN: 13.1 g/dL (ref 12.0–15.0)
LYMPHS PCT: 55 % — AB (ref 12–46)
Lymphs Abs: 2.7 10*3/uL (ref 0.7–4.0)
MCH: 27.1 pg (ref 26.0–34.0)
MCHC: 32.8 g/dL (ref 30.0–36.0)
MCV: 82.6 fL (ref 78.0–100.0)
MONO ABS: 0.5 10*3/uL (ref 0.1–1.0)
Monocytes Relative: 10 % (ref 3–12)
NEUTROS ABS: 1.5 10*3/uL — AB (ref 1.7–7.7)
Neutrophils Relative %: 31 % — ABNORMAL LOW (ref 43–77)
Platelets: 232 10*3/uL (ref 150–400)
RBC: 4.83 MIL/uL (ref 3.87–5.11)
RDW: 13.1 % (ref 11.5–15.5)
WBC: 4.8 10*3/uL (ref 4.0–10.5)

## 2014-10-22 LAB — ETHANOL

## 2014-10-22 NOTE — BH Assessment (Addendum)
Tele Assessment Note   Rebekah Rhodes is an 18 y.o. female. Pt presented to ED tonight accompanied by her father and sister reporting that she has been depressed for over a year and is "getting overwhelmed."  Pt denies SI, HI, SH impulses such as cutting or AVH.  Pt reports that approximately 1 year ago her maternal grandmother died.  Pt stated that she was allowed to stay in the room while her grandmother died and it has bothered her along with losing her grandmother ever since. Pt stated that being in the room when her grandmother died "messed her up."  Pt reported that in the same year, her senior year of high school, she experienced a "bad breakup."  Pt stated that her depression kept getting worse and then, once she started college this school year she has become overwhelmed by school.  Pt stated that she and her mother "do not have a good mother/daughter relationship."  Pt stated she has tried everything she knows to try to improve their relationship but "nothing works." Pt reports that all of these issues combined are ovewhelming her and she is getting more and more depressed.    Pt has a history of cutting but, stated that she has not cut in over a year.  Pt states that she no longer feels the impulse to cut.  Pt denies any IP admissions. Pt stated that she did have 2 weeks of OPT when her grandmother died but she stopped going. Pt stated that she would like to start counseling now.  Pt has symptoms of depression including feeling hopeless, helpless and worthless (at times), increased fatigue, decreased sleep, tearfulness, irritability and isolating behaviors.  Pt denies any substances use.   Pt was alert, cooperative and pleasant.  Her stated mood was depressed/sad and her blunted affect was congruent.  Pt's speech, movement and dress was unremarkable.  Pt's eye contact was fair.  Pt's thought processes were coherent and relevant but her insight and judgement were somewhat impaired.    Axis I:311  Unspecified Depressive Disorder Axis II: Deferred Axis III: None Axis IV: educational problems, other psychosocial or environmental problems, problems related to social environment and problems with primary support group Axis V: 11-20 some danger of hurting self or others possible OR occasionally fails to maintain minimal personal hygiene OR gross impairment in communication  Past Medical History:  Past Medical History  Diagnosis Date  . Asthma   . HA (headache)     Past Surgical History  Procedure Laterality Date  . None      Family History:  Family History  Problem Relation Age of Onset  . Breast cancer Mother     Social History:  reports that she has never smoked. She has never used smokeless tobacco. She reports that she does not drink alcohol or use illicit drugs.  Additional Social History:     CIWA: CIWA-Ar BP: 121/81 mmHg Pulse Rate: 84 COWS:    PATIENT STRENGTHS: (choose at least two) Ability for insight Average or above average intelligence Capable of independent living Communication skills Supportive family/friends  Allergies:  Allergies  Allergen Reactions  . Amoxicillin     Childhood allergy    Home Medications:  (Not in a hospital admission)  OB/GYN Status:  No LMP recorded. Patient has had an implant.  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) Admission Status: Voluntary Is patient capable of signing voluntary admission?: Yes Transfer from: Home Referral Source: Self/Family/Friend  Medical Screening Exam [facility] Walk-in ONLY) Medical Exam completed: Yes  [facility] Crisis Care Plan Living Arrangements: Parent Name of  Psychiatrist: none Name of Therapist: none  Education Status Is patient currently in school?: Yes Current Grade:  (Freshman in [place]) Highest grade of school patient has completed: 12 Name of school: [employer] person: na  Risk to self with the past 6 months Suicidal Ideation: No-Not Currently/Within Last 6 Months Suicidal Intent: No-Not Currently/Within Last 6 Months Is patient at risk for suicide?: No Suicidal Plan?: No Access to Means: No What has been your use of drugs/alcohol within the last 12 months?: none (denies) Previous Attempts/Gestures: No (denies) How many times?: 0 Other Self Harm Risks: cutting Triggers for Past Attempts: Family contact, Other personal contacts (grief over PGM death;relationship breakup; family conflict) Intentional Self Injurious Behavior: Cutting (previous cutting hx; pt states she has not cut in over 1 yea) Comment - Self Injurious Behavior: cutting Family Suicide History: Unknown (pt unsure) Recent stressful life event(s): Loss (Comment), Conflict (Comment), Other (Comment) (Freshman yr in college; family conflict; death of MGM) Persecutory voices/beliefs?: No (denies) Depression: Yes Depression Symptoms: Insomnia, Tearfulness, Isolating, Fatigue, Loss of interest in usual pleasures, Feeling worthless/self pity, Feeling angry/irritable Substance abuse history and/or treatment for substance abuse?: No (denies) Suicide prevention information given to non-admitted patients: Yes  Risk to Others within the past 6 months Homicidal Ideation: No (Denies) Thoughts of Harm to Others: No Current Homicidal Intent: No Current Homicidal Plan: No Access to Homicidal Means: No (denies) Identified Victim: na History of harm to others?: No (denies) Assessment of Violence: None Noted Violent Behavior Description: na Does patient have access to weapons?: No (denies) Criminal Charges Pending?: No (denies) Does patient have a court date:  No (denies)  Psychosis Hallucinations: None noted (denies) Delusions: None noted  Mental Status Report Appear/Hygiene: Unremarkable Eye Contact: Fair Motor Activity: Unremarkable Speech: Unremarkable Level of Consciousness: Quiet/awake Mood: Depressed Affect: Blunted Anxiety Level: Minimal Thought Processes: Coherent, Relevant Judgement: Impaired Orientation: Person, Place, Situation Obsessive Compulsive Thoughts/Behaviors: Unable to Assess  Cognitive Functioning Concentration: Decreased (per pt) Memory: Recent Intact, Remote Intact IQ: Average Insight: Fair Impulse Control: Unable to Assess Appetite: Poor (per pt) Weight Loss: 0 Weight Gain: 0 Sleep: Decreased Total Hours of Sleep: 3 (in a 24 hr period; pt states she is sleeping more during day) Vegetative Symptoms: Staying in bed  ADLScreening [facility] Assessment Services) Patient's cognitive ability adequate to safely complete daily activities?: Yes Patient able to express need for assistance with ADLs?: Yes Independently performs ADLs?: Yes (appropriate for developmental age)  Prior Inpatient Therapy Prior Inpatient Therapy: No (denies)  Prior Outpatient Therapy Prior Outpatient Therapy: No  ADL Screening (condition at time of admission) Patient's cognitive ability adequate to safely complete daily activities?: Yes Patient able to express need for assistance with ADLs?: Yes Independently performs ADLs?: Yes (appropriate for developmental age)    Merchant navy officer (For Healthcare) Does patient have an advance directive?: No    Additional Information 1:1 In Past 12 Months?: No CIRT Risk: No Elopement Risk: No Does patient have medical clearance?: Yes  Child/Adolescent Assessment Running Away Risk: Denies Bed-Wetting: Denies Destruction of Property: Denies Cruelty to Animals: Denies Stealing: Denies Rebellious/Defies Authority: Denies Satanic Involvement:  Denies Fire Setting: Denies Problems at  [employer]: Admits Problems at [employer] as Evidenced By: "overwhemed" by school per pt Gang Involvement: Denies  Disposition Initial Assessment Completed for this Encounter: Yes Disposition of Patient: Outpatient treatment (Discharge with OP Resources) Type of outpatient treatment: Adult  Spoke with [facility] PA, Donell SievertSpencer Simon: Pt does not meet IP criteria as no SI, HI or AVH. Recommendation is to discharge pt with OP resources in the area for her to follow-up for OPT. Also, will ask her to sign a No Harm Contract.   Spoke with Earley FavorGail Schulz: Advised of assessment outcome and diagnosis of Unspecified Depressive Disorder. I advised off the recommendation of discharge with OP resources and asking her to sign a No Harm Contract.   Spoke to ED RN, Sandy SalaamLynnsey Johnson: Advised of assessment outcome and plan. Asked her to ask pt to sign a No Harm contract and give her Suicide Prevention information and OP Resources in the area for follow-up.  MH Tech will fax forms over to [place]ynnsey.  Beryle FlockMary Tiyona Desouza, MS, CRC, [facility] Triage Specialist [facility]  Loren Sawaya T 10/22/2014 1:38 AM

## 2014-10-22 NOTE — BH Assessment (Addendum)
Assessment requested and assigned.  Spoke to Earley FavorGail Schulz, NP in Ed:  She advised that pt is college student living with father but staying with mother recently.  Fighting with mother and MGM.  Having school problems but cannot drop out.  Relationship problems.  Cuts herself.  Not vebalizing SI in ED.  Dondra SpryGail volunteered to have RN, [employer]ynnsey Johson, put TA equipment in the pt's room.  Beryle FlockMary Lucina Betty, MS, CRC, [facility] Triage Specialist [facility]

## 2014-10-22 NOTE — ED Notes (Signed)
Pt presents with c/o suicidal thoughts that have been going on since last year. Pt reports she is under some stress from her parents and bad relationships. Pt reports that she does not feel happy. Pt denies plan, thoughts only. Pt denies HI, drug, or alcohol abuse.

## 2014-10-22 NOTE — Discharge Instructions (Signed)
Depression Depression is feeling sad, low, down in the dumps, blue, gloomy, or empty. In general, there are two kinds of depression:  Normal sadness or grief. This can happen after something upsetting. It often goes away on its own within 2 weeks. After losing a loved one (bereavement), normal sadness and grief may last longer than two weeks. It usually gets better with time.  Clinical depression. This kind lasts longer than normal sadness or grief. It keeps you from doing the things you normally do in life. It is often hard to function at home, work, or at school. It may affect your relationships with others. Treatment is often needed. GET HELP RIGHT AWAY IF:  You have thoughts about hurting yourself or others.  You lose touch with reality (psychotic symptoms). You may:  See or hear things that are not real.  Have untrue beliefs about your life or people around you.  Your medicine is giving you problems. MAKE SURE YOU:  Understand these instructions.  Will watch your condition.  Will get help right away if you are not doing well or get worse. Document Released: 11/19/2010 Document Revised: 03/03/2014 Document Reviewed: 02/16/2012 [facility] Patient Information 2015 [place], [place]. This information is not intended to replace advice given to you by your health care provider. Make sure you discuss any questions you have with your health care provider.  Major Depressive Disorder Major depressive disorder is a mental illness. It also may be called clinical depression or unipolar depression. Major depressive disorder usually causes feelings of sadness, hopelessness, or helplessness. Some people with this disorder do not feel particularly sad but lose interest in doing things they used to enjoy (anhedonia). Major depressive disorder also can cause physical symptoms. It can interfere with work, school, relationships, and other normal everyday activities. The disorder varies in severity but is  longer lasting and more serious than the sadness we all feel from time to time in our lives. Major depressive disorder often is triggered by stressful life events or major life changes. Examples of these triggers include divorce, loss of your job or home, a move, and the death of a family member or close friend. Sometimes this disorder occurs for no obvious reason at all. People who have family members with major depressive disorder or bipolar disorder are at higher risk for developing this disorder, with or without life stressors. Major depressive disorder can occur at any age. It may occur just once in your life (single episode major depressive disorder). It may occur multiple times (recurrent major depressive disorder). SYMPTOMS People with major depressive disorder have either anhedonia or depressed mood on nearly a daily basis for at least 2 weeks or longer. Symptoms of depressed mood include:  Feelings of sadness (blue or down in the dumps) or emptiness.  Feelings of hopelessness or helplessness.  Tearfulness or episodes of crying (may be observed by others).  Irritability (children and adolescents). In addition to depressed mood or anhedonia or both, people with this disorder have at least four of the following symptoms:  Difficulty sleeping or sleeping too much.   Significant change (increase or decrease) in appetite or weight.   Lack of energy or motivation.  Feelings of guilt and worthlessness.   Difficulty concentrating, remembering, or making decisions.  Unusually slow movement (psychomotor retardation) or restlessness (as observed by others).   Recurrent wishes for death, recurrent thoughts of self-harm (suicide), or a suicide attempt. People with major depressive disorder commonly have persistent negative thoughts about themselves, other people,  and the world. People with severe major depressive disorder may experiencedistorted beliefs or perceptions about the world  (psychotic delusions). They also may see or hear things that are not real (psychotic hallucinations). DIAGNOSIS Major depressive disorder is diagnosed through an assessment by your health care provider. Your health care provider will ask aboutaspects of your daily life, such as mood,sleep, and appetite, to see if you have the diagnostic symptoms of major depressive disorder. Your health care provider may ask about your medical history and use of alcohol or drugs, including prescription medicines. Your health care provider also may do a physical exam and blood work. This is because certain medical conditions and the use of certain substances can cause major depressive disorder-like symptoms (secondary depression). Your health care provider also may refer you to a mental health specialist for further evaluation and treatment. TREATMENT It is important to recognize the symptoms of major depressive disorder and seek treatment. The following treatments can be prescribed for this disorder:   Medicine. Antidepressant medicines usually are prescribed. Antidepressant medicines are thought to correct chemical imbalances in the brain that are commonly associated with major depressive disorder. Other types of medicine may be added if the symptoms do not respond to antidepressant medicines alone or if psychotic delusions or hallucinations occur.  Talk therapy. Talk therapy can be helpful in treating major depressive disorder by providing support, education, and guidance. Certain types of talk therapy also can help with negative thinking (cognitive behavioral therapy) and with relationship issues that trigger this disorder (interpersonal therapy). A mental health specialist can help determine which treatment is best for you. Most people with major depressive disorder do well with a combination of medicine and talk therapy. Treatments involving electrical stimulation of the brain can be used in situations with  extremely severe symptoms or when medicine and talk therapy do not work over time. These treatments include electroconvulsive therapy, transcranial magnetic stimulation, and vagal nerve stimulation. Document Released: 02/11/2013 Document Revised: 03/03/2014 Document Reviewed: 02/11/2013 [facility] Patient Information 2015 [place]. This information is not intended to replace advice given to you by your health care provider. Make sure you discuss any questions you have with your health care provider. Please make an appointment with outpatient counseling Return at any time if you develop new or worsening symptoms    Emergency Department Resource Guide 1) Find a Doctor and Pay Out of Pocket Although you won't have to find out who is covered by your insurance plan, it is a good idea to ask around and get recommendations. You will then need to call the office and see if the doctor you have chosen will accept you as a new patient and what types of options they offer for patients who are self-pay. Some doctors offer discounts or will set up payment plans for their patients who do not have insurance, but you will need to ask so you aren't surprised when you get to your appointment.  2) Contact Your Local Health Department Not all health departments have doctors that can see patients for sick visits, but many do, so it is worth a call to see if yours does. If you don't know where your local health department is, you can check in your phone book. The CDC also has a tool to help you locate your state's health department, and many state websites also have listings of all of their local health departments.  3) Find a Walk-in Clinic If your illness is not likely to be very severe or  complicated, you may want to try a walk in clinic. These are popping up all over the country in pharmacies, drugstores, and shopping centers. They're usually staffed by nurse practitioners or physician assistants that have been  trained to treat common illnesses and complaints. They're usually fairly quick and inexpensive. However, if you have serious medical issues or chronic medical problems, these are probably not your best option.  No Primary Care Doctor: - Call Health Connect at  [phone] - they can help you locate a primary care doctor that  accepts your insurance, provides certain services, etc. - Physician Referral Service- [phone]  Chronic Pain Problems: Organization         Address  Phone   Notes  [facility]  [phone] Patients need to be referred by their primary care doctor.   Medication Assistance: Organization         Address  Phone   Notes  [facility] Medication [facility] [address] [place], [place] [address] [phone] --Must be a resident of [facility] -- Must have NO insurance coverage whatsoever (no Medicaid/ Medicare, etc.) -- The pt. MUST have a primary care doctor that directs their care regularly and follows them in the community   Med[facility]ist  [phone]   [employer]  [phone]    Agencies that provide inexpensive medical care: Organization         Address  Phone   Notes  [facility]  [phone]   [facility]    [phone]   [facility] [address] [place], [place] [address] [phone]   [facility] [address] [place], [place] [phone]   Planned Parenthood    [phone]   [facility]    [phone]   [facility]  [address], [place] Phone:  [phone], Fax:  [phone] Hours of Operation:  9 am - 6 pm, M-F.  Also accepts Medicaid/Medicare and self-pay.  [facility]  [address], [place] Phone: [phone], Fax: [phone]. Hours of Operation:  8:30 am - 5:30 pm, M-F.  Also accepts Medicaid and self-pay.   [facility] : [facility] [place] [address], [place] Phone: [phone]   [facility] [address] Natasha Bence [place], [place] [phone], Ext. [phone] Mondays & Thursdays: 7-9 AM.  First 15 patients are seen on a first come, first serve basis.    Medicaid-accepting [facility] Providers:  Organization         Address  Phone   Notes  [facility] [address], [place] [phone] Also accepts self-pay patients.  [facility] [address] Laurell Josephs [place], [place]  [phone]   [facility] [address], [place] [phone]   [facility] [address], [place] [phone]   Renaye Rakers [address], [place]   [phone] Only accepts [place] patients after they have their name applied to their card.   Self-Pay (no insurance) in [facility]:  Organization         Address  Phone   Notes  Sickle Cell Patients, [facility] Internal Medicine [address] [place], [place] [phone]   [facility] [address]  [address], [place] [phone]   [facility] [place]  [address], [place] [phone]   [facility]/Dr. Osei-Bonsu  [address] [place] or [address], [place] [phone] Phone number for both [place] and [place] locations is the same.  [facility] [address] [place] [phone]   [facility] [address], [place] or [address] [phone]   [facility] [address], [place] [phone], phone; [phone], fax Sees patients 1st and 3rd Saturday of every month.  Must not qualify for public or private insurance (i.e. Medicaid, Medicare, [place] Health Choice, Veterans' Benefits)  Household income should be no  more than 200% of the poverty level The clinic cannot treat you if you are pregnant or think you are pregnant  Sexually transmitted diseases are not treated at the clinic.    Dental Care: Organization         Address  Phone  Notes  [facility] Department of [facility] [address] [place], [place] [phone] Accepts children up to age 86 who are enrolled in [place] or [place] Health Choice; pregnant women with a Medicaid card; and children who have applied for Medicaid or [place] Health Choice, but were declined, whose parents can pay a reduced fee at time of service.  [facility] Department of [facility]  [address], [place] [phone] Accepts children up to age 67 who are enrolled in [place] or [place] Health Choice; pregnant women with a Medicaid card; and children who have applied for Medicaid or [place] Health Choice, but were declined, whose parents can pay a reduced fee at time of service.  [facility]  [address] [place], [place] [phone] Patients are seen by appointment only. Walk-ins are not accepted. [facility] will see patients 3 years of age and older. Monday - Tuesday (8am-5pm) Most Wednesdays (8:30-5pm) $30 per visit, cash only  [facility] Adult Dental Access [facility]  [address], [facility] [phone] Patients are seen by appointment only. Walk-ins are not accepted. [facility] will see patients 67 years of age and older. One Wednesday Evening (Monthly: Volunteer Based).  $30 per visit, cash only  [employer]  [phone] for adults; Children under age 54, call Graduate Pediatric Dentistry at [phone]. Children aged 84-14, please call [phone] to request a pediatric application.  Dental services are provided in all areas of dental care including fillings, crowns and bridges, complete and partial dentures, implants, gum treatment, root canals,  and extractions. Preventive care is also provided. Treatment is provided to both adults and children. Patients are selected via a lottery and there is often a waiting list.   [facility] [address], [place]  [phone] [email or web address]   [facility] [address] [place], [place] [phone], Ext. [phone] Second and Fourth Thursday of each month, opens at 6:30 AM; Clinic ends at 9 AM.  Patients are seen on a first-come first-served basis, and a limited number are seen during each clinic.   [facility]  [address] Ether Griffins [place], [place] [phone]   Eligibility Requirements You must have lived in [place], or [place] counties for at least the last three months.   You cannot be  eligible for state or federal sponsored [place]healthcare insurance, including CIG[employer]eterans Administration, [place]icaid, or [employer]icare.   You generally cannot be eligible for healthcare insurance through your employer.    How to apply: Eligibility screenings are held every Tuesday and Wednesday afternoon from 1:00 pm until 4:00 pm. You do not need an appointment for the interview!  [facility] [address], [place], [place] [phone]   [facility]  [phone]   [facility]  [phone]   [facility]  [phone]    Behavioral Health Resources in the Community: Intensive Outpatient Programs Organization         Address  Phone  Notes  [facility] [address], [place], [place] [phone]   [facility] [address], [place], [place] [phone]   [facility] [address], [place], [place]  [phone]   [facility] [address],  [place], [place] [phone] or [phone]   Substance Abuse Resources Organization         Address  Phone  Notes  [facility]  [phone]    [facility]  [phone]   [facility]  [phone]   [facility]  [phone]   Residential & Outpatient Substance Abuse Program  [phone]   Psychological Services Organization         Address  Phone  Notes  [facility]  [phone]- [phone]   [facility]  [phone]- [phone]   [facility] [address], [place] [phone] or [phone]    Mobile Crisis Teams Organization         Address  Phone  Notes  [facility], Mobile Crisis Care Unit  [phone]   [facility]  [address] [place], [place] [phone]   Doristine LocksSharon DeEsch [address] [place] [phone]    Self-Help/Support Groups Organization         Address  Phone             Notes  [facility] [place] - variety of support groups  [phone] Call for more information  Narcotics Anonymous (NA), [facility] [address], [place]   2 meetings at this location   Statisticianesidential Treatment Programs Organization         Address  Phone  Notes  [facility] [address],    [place]  [phone]   [facility]  [address], [place] [phone], [place], [place] [phone]   [facility] [address], [place] [phone] Admissions: 8am-3pm M-F  [facility] [address]    [place], [place] [phone]   [facility] [address], [place], [place] [phone]   [facility] [address]  [place], [place] [phone]   [facility] - Intensive Outpatient [address], [place], [place] [phone]   [facility] ([facility] [address]  [place], [place] [phone] or [phone]   [facility] ([facility]) [address] [place], [place] [phone] Accepts Medicaid  [facility] [address]    [place] [phone] Substance Abuse/Addiction Treatment   [facility] Resources Organization         Address  Phone  Notes  [facility]  [phone]) [phone]   Angie FavaJulie Brannon, PhD [address] [place], [place]   [phone] or [phone]) [phone]   [facility]   [address] [place], [place] [phone]   [facility] [address] [place], [place] [phone] Insurance/Medicaid/sponsorship through [facility] and Families [address]                                    [place], [place] [phone] Therapy/tele-psych/case  [facility] [address]   [place], [place] [phone]    Dr. Lolly Mustache  [phone]   [facility]  [employer] [facility] Dept. 1) [address], [place] 2) [address] [place] 3)  [address], [place] [phone]  [phone]   [facility] Child Abuse Hotline [phone] or [phone] (After Hours)

## 2014-10-22 NOTE — BH Assessment (Signed)
Assessment completed.  Spoke with [facility] PA, Donell SievertSpencer Simon:  Pt does not meet IP criteria as no SI, HI or AVH.  Recommendation is to discharge pt with OP resources in the area for her to follow-up for OPT.  Also, will ask her to sign a No Harm Contract.    Spoke with Earley FavorGail Schulz: Advised of assessment outcome and diagnosis of Unspecified Depressive Disorder.  I advised off the recommendation of discharge with OP resources and asking her to sign a No Harm Contract.    Spoke to ED RN, Sandy SalaamLynnsey Johnson: Advised of assessment outcome and plan.  Asked her to ask pt to sign a No Harm contract and give her Suicide Prevention information and OP Resources in the area for follow-up.   Beryle FlockMary Raneshia Derick, MS, CRC, [facility] Triage Specialist [facility]

## 2015-02-12 ENCOUNTER — Encounter (HOSPITAL_COMMUNITY): Payer: Self-pay | Admitting: *Deleted

## 2015-02-12 ENCOUNTER — Emergency Department (HOSPITAL_COMMUNITY): Payer: BLUE CROSS/BLUE SHIELD

## 2015-02-12 ENCOUNTER — Emergency Department (HOSPITAL_COMMUNITY)
Admission: EM | Admit: 2015-02-12 | Discharge: 2015-02-12 | Disposition: A | Discharge status: Home / Self Care | Payer: BLUE CROSS/BLUE SHIELD | Attending: Emergency Medicine | Admitting: Emergency Medicine

## 2015-02-12 DIAGNOSIS — Z791 Long term (current) use of non-steroidal anti-inflammatories (NSAID): Secondary | ICD-10-CM | POA: Insufficient documentation

## 2015-02-12 DIAGNOSIS — Z79899 Other long term (current) drug therapy: Secondary | ICD-10-CM | POA: Insufficient documentation

## 2015-02-12 DIAGNOSIS — H578 Other specified disorders of eye and adnexa: Secondary | ICD-10-CM | POA: Insufficient documentation

## 2015-02-12 DIAGNOSIS — Z880 Allergy status to penicillin: Secondary | ICD-10-CM | POA: Insufficient documentation

## 2015-02-12 DIAGNOSIS — R05 Cough: Secondary | ICD-10-CM | POA: Diagnosis present

## 2015-02-12 DIAGNOSIS — Z7951 Long term (current) use of inhaled steroids: Secondary | ICD-10-CM | POA: Diagnosis not present

## 2015-02-12 DIAGNOSIS — J45909 Unspecified asthma, uncomplicated: Secondary | ICD-10-CM | POA: Insufficient documentation

## 2015-02-12 DIAGNOSIS — J40 Bronchitis, not specified as acute or chronic: Secondary | ICD-10-CM

## 2015-02-12 MED ORDER — PREDNISONE 10 MG PO TABS: ORAL_TABLET | ORAL | Status: DC

## 2015-02-12 MED ORDER — PREDNISONE 20 MG PO TABS
60.0000 mg | ORAL_TABLET | Freq: Once | ORAL | Status: AC
  Administered 2015-02-12: 60 mg via ORAL
  Filled 2015-02-12: qty 3

## 2015-02-12 MED ORDER — HYDROCOD POLST-CHLORPHEN POLST 10-8 MG/5ML PO LQCR
5.0000 mL | Freq: Once | ORAL | Status: AC
  Administered 2015-02-12: 5 mL via ORAL
  Filled 2015-02-12: qty 5

## 2015-02-12 MED ORDER — ALBUTEROL SULFATE HFA 108 (90 BASE) MCG/ACT IN AERS: 2.0000 | INHALATION_SPRAY | RESPIRATORY_TRACT | Status: DC | PRN

## 2015-02-12 MED ORDER — BENZONATATE 100 MG PO CAPS: 100.0000 mg | ORAL_CAPSULE | Freq: Three times a day (TID) | ORAL | Status: DC

## 2015-02-12 NOTE — ED Notes (Signed)
Patient presents with c/o cough for about 1 week and now a sore throat

## 2015-02-12 NOTE — Discharge Instructions (Signed)
Inhaler 2 puffs every 4 hrs for shortness of breath. Tessalon for cough. Prednisone as prescribed until all gone for cough and inflammation. Follow up with your doctor if not improving.   Acute Bronchitis Bronchitis is inflammation of the airways that extend from the windpipe into the lungs (bronchi). The inflammation often causes mucus to develop. This leads to a cough, which is the most common symptom of bronchitis.  In acute bronchitis, the condition usually develops suddenly and goes away over time, usually in a couple weeks. Smoking, allergies, and asthma can make bronchitis worse. Repeated episodes of bronchitis may cause further lung problems.  CAUSES Acute bronchitis is most often caused by the same virus that causes a cold. The virus can spread from person to person (contagious) through coughing, sneezing, and touching contaminated objects. SIGNS AND SYMPTOMS   Cough.   Fever.   Coughing up mucus.   Body aches.   Chest congestion.   Chills.   Shortness of breath.   Sore throat.  DIAGNOSIS  Acute bronchitis is usually diagnosed through a physical exam. Your health care provider will also ask you questions about your medical history. Tests, such as chest X-rays, are sometimes done to rule out other conditions.  TREATMENT  Acute bronchitis usually goes away in a couple weeks. Oftentimes, no medical treatment is necessary. Medicines are sometimes given for relief of fever or cough. Antibiotic medicines are usually not needed but may be prescribed in certain situations. In some cases, an inhaler may be recommended to help reduce shortness of breath and control the cough. A cool mist vaporizer may also be used to help thin bronchial secretions and make it easier to clear the chest.  HOME CARE INSTRUCTIONS  Get plenty of rest.   Drink enough fluids to keep your urine clear or pale yellow (unless you have a medical condition that requires fluid restriction). Increasing  fluids may help thin your respiratory secretions (sputum) and reduce chest congestion, and it will prevent dehydration.   Take medicines only as directed by your health care provider.  If you were prescribed an antibiotic medicine, finish it all even if you start to feel better.  Avoid smoking and secondhand smoke. Exposure to cigarette smoke or irritating chemicals will make bronchitis worse. If you are a smoker, consider using nicotine gum or skin patches to help control withdrawal symptoms. Quitting smoking will help your lungs heal faster.   Reduce the chances of another bout of acute bronchitis by washing your hands frequently, avoiding people with cold symptoms, and trying not to touch your hands to your mouth, nose, or eyes.   Keep all follow-up visits as directed by your health care provider.  SEEK MEDICAL CARE IF: Your symptoms do not improve after 1 week of treatment.  SEEK IMMEDIATE MEDICAL CARE IF:  You develop an increased fever or chills.   You have chest pain.   You have severe shortness of breath.  You have bloody sputum.   You develop dehydration.  You faint or repeatedly feel like you are going to pass out.  You develop repeated vomiting.  You develop a severe headache. MAKE SURE YOU:   Understand these instructions.  Will watch your condition.  Will get help right away if you are not doing well or get worse. Document Released: 11/24/2004 Document Revised: 03/03/2014 Document Reviewed: 04/09/2013 [facility] Patient Information 2015 [place], [place]. This information is not intended to replace advice given to you by your health care provider. Make sure you discuss  any questions you have with your health care provider. ° °

## 2015-02-12 NOTE — ED Provider Notes (Signed)
CSN: [id number]     Arrival date & time 02/12/15  0011 History   First MD Initiated Contact with Patient 02/12/15 [phone]     Chief Complaint  Patient presents with  . Sore Throat  . Cough     (Consider location/radiation/quality/duration/timing/severity/associated sxs/prior Treatment) HPI Rebekah Rhodes is a 19 Rebekah.o. female with history of asthma, presents to emergency department complaining of cough, congestion for a week. She has been taking over-the-counter medications for cough with no improvement of her symptoms. She states she is cannot sleep at night due to this persistent cough. States cough is productive with clear sputum. She does not know if she has been running a fever. She reports also seasonal allergies, erythema to the right eye, itching, nasal discharge. She states her throat is sore from coughing. She denies any chest pain. No shortness of breath. No nausea or vomiting. She has not been taking her inhalers because she ran out. She has no other complaints.  Past Medical History  Diagnosis Date  . Asthma   . HA (headache)    Past Surgical History  Procedure Laterality Date  . None     Family History  Problem Relation Age of Onset  . Breast cancer Mother    History  Substance Use Topics  . Smoking status: Never Smoker   . Smokeless tobacco: Never Used  . Alcohol Use: No   OB History    No data available     Review of Systems  Constitutional: Negative for fever and chills.  HENT: Positive for congestion, rhinorrhea and sore throat.   Eyes: Positive for redness and itching.  Respiratory: Positive for cough. Negative for chest tightness and shortness of breath.   Cardiovascular: Negative for chest pain, palpitations and leg swelling.  Gastrointestinal: Negative for nausea, vomiting, abdominal pain and diarrhea.  Musculoskeletal: Negative for myalgias, arthralgias, neck pain and neck stiffness.  Skin: Negative for rash.  Neurological: Negative for dizziness,  weakness and headaches.  All other systems reviewed and are negative.     Allergies  Amoxicillin  Home Medications   Prior to Admission medications   Medication Sig Start Date End Date Taking? Authorizing Provider  cetirizine (ZYRTEC) 10 MG tablet Take 10 mg by mouth daily.    Historical Provider, MD  fluticasone (FLONASE) 50 MCG/ACT nasal spray Place 1 spray into both nostrils daily.    Historical Provider, MD  ibuprofen (ADVIL,MOTRIN) 200 MG tablet Take 400 mg by mouth every 6 (six) hours as needed for headache.    Historical Provider, MD  meloxicam (MOBIC) 15 MG tablet Take 1 tablet (15 mg total) by mouth daily. 07/12/14   Reuben Likes, MD  naproxen (NAPROSYN) 500 MG tablet Take 1 tablet (500 mg total) by mouth 2 (two) times daily with a meal. 02/26/14   Garlon Hatchet, PA-C  nortriptyline (PAMELOR) 10 MG capsule One tablet every night for one week, then 2 tablets every night 03/10/14   Levert Feinstein, MD  Olopatadine HCl (PATADAY) 0.2 % SOLN Place 1 drop into both eyes daily.    Historical Provider, MD  predniSONE (DELTASONE) 10 MG tablet Take 6 tablets (60 mg total) by mouth daily. 02/19/14   Azalia Bilis, MD  rizatriptan (MAXALT-MLT) 5 MG disintegrating tablet Take 1 tablet (5 mg total) by mouth as needed for migraine. May repeat in 2 hours if needed 03/10/14   Levert Feinstein, MD  traMADol (ULTRAM) 50 MG tablet Take 2 tablets (100 mg total) by mouth every 8 (  eight) hours as needed. 07/12/14   Reuben Likesavid C Keller, MD   BP 124/83 mmHg  Pulse 82  Temp(Src) 98.2 F (36.8 C) (Oral)  Resp 14  Ht 5\' 7"  (1.702 m)  Wt 124 lb (56.246 kg)  BMI 19.42 kg/m2  SpO2 100% Physical Exam  Constitutional: She appears well-developed and well-nourished. No distress.  HENT:  Head: Normocephalic.  Normal outer ear, ear canals, TMs bilaterally. Nasal rhinorrhea noted. Oropharynx is normal. Uvula is midline  Eyes: EOM are normal. Pupils are equal, round, and reactive to light.  Right conjunctiva is injected, no  drainage  Neck: Normal range of motion. Neck supple.  Cardiovascular: Normal rate, regular rhythm and normal heart sounds.   Pulmonary/Chest: Effort normal and breath sounds normal. No respiratory distress. She has no wheezes. She has no rales.  Abdominal: Soft. Bowel sounds are normal. She exhibits no distension. There is no tenderness. There is no rebound.  Musculoskeletal: She exhibits no edema.  Lymphadenopathy:    She has no cervical adenopathy.  Neurological: She is alert.  Skin: Skin is warm and dry.  Psychiatric: She has a normal mood and affect. Her behavior is normal.  Nursing note and vitals reviewed.   ED Course  Procedures (including critical care time) Labs Review Labs Reviewed - No data to display  Imaging Review Dg Chest 2 View  02/12/2015   CLINICAL DATA:  Cough and sore throat.  Body aches for 1 week.  EXAM: CHEST  2 VIEW  COMPARISON:  02/26/2014  FINDINGS: The lungs remain hyperinflated. There are increased interstitial markings. The cardiomediastinal contours are normal. Pulmonary vasculature is normal. No consolidation, pleural effusion, or pneumothorax. No acute osseous abnormalities are seen.  IMPRESSION: Hyperinflation with increased interstitial markings, suggestive of bronchitis or asthma.   Electronically Signed   By: Rubye OaksMelanie  Ehinger M.D.   On: 02/12/2015 02:47     EKG Interpretation None      MDM   Final diagnoses:  Bronchitis     patient with cough for a week. Will get chest x-ray to rule out pneumonia. Vital signs are normal. No wheezing on exam.  Chest x-ray is negative except for increased interstitial markings suggestive of asthma. We'll discharge home with an inhaler, prednisone taper, Tessalon. Follow-up with primary care doctor. Vital signs are normal. Oxygen saturation is 100%. She is no tachypnea, and she is not tachycardic, she is nontoxic appearing  Filed Vitals:   02/12/15 0015 02/12/15 0252  BP: 124/83 120/81  Pulse: 82 82  Temp:  98.2 F (36.8 C) 98.7 F (37.1 C)  TempSrc: Oral Oral  Resp: 14 16  Height: 5\' 7"  (1.702 m)   Weight: 124 lb (56.246 kg)   SpO2: 100% 97%     Jaynie Crumbleatyana Nashiya Disbrow, PA-C 02/13/15 0009  Arby BarretteMarcy Pfeiffer, MD 02/13/15 [phone]

## 2015-02-12 NOTE — ED Notes (Signed)
Patient transported to X-ray 

## 2015-02-15 ENCOUNTER — Encounter (HOSPITAL_COMMUNITY): Payer: Self-pay | Admitting: *Deleted

## 2015-02-15 ENCOUNTER — Emergency Department (HOSPITAL_COMMUNITY)
Admission: EM | Admit: 2015-02-15 | Discharge: 2015-02-15 | Disposition: A | Discharge status: Home / Self Care | Payer: BLUE CROSS/BLUE SHIELD | Attending: Emergency Medicine | Admitting: Emergency Medicine

## 2015-02-15 DIAGNOSIS — J45909 Unspecified asthma, uncomplicated: Secondary | ICD-10-CM | POA: Diagnosis not present

## 2015-02-15 DIAGNOSIS — R112 Nausea with vomiting, unspecified: Secondary | ICD-10-CM | POA: Diagnosis not present

## 2015-02-15 DIAGNOSIS — J40 Bronchitis, not specified as acute or chronic: Secondary | ICD-10-CM

## 2015-02-15 DIAGNOSIS — Z792 Long term (current) use of antibiotics: Secondary | ICD-10-CM | POA: Diagnosis not present

## 2015-02-15 DIAGNOSIS — Z791 Long term (current) use of non-steroidal anti-inflammatories (NSAID): Secondary | ICD-10-CM | POA: Insufficient documentation

## 2015-02-15 DIAGNOSIS — Z7952 Long term (current) use of systemic steroids: Secondary | ICD-10-CM | POA: Insufficient documentation

## 2015-02-15 DIAGNOSIS — J069 Acute upper respiratory infection, unspecified: Secondary | ICD-10-CM | POA: Insufficient documentation

## 2015-02-15 DIAGNOSIS — R05 Cough: Secondary | ICD-10-CM | POA: Diagnosis present

## 2015-02-15 DIAGNOSIS — Z880 Allergy status to penicillin: Secondary | ICD-10-CM | POA: Diagnosis not present

## 2015-02-15 MED ORDER — PHENYLEPHRINE-DM-GG-APAP 5-10-200-325 MG PO TABS: 2.0000 | ORAL_TABLET | Freq: Three times a day (TID) | ORAL | Status: DC

## 2015-02-15 MED ORDER — AEROCHAMBER PLUS FLO-VU MEDIUM MISC
1.0000 | Freq: Once | Status: AC
  Administered 2015-02-15: 1

## 2015-02-15 MED ORDER — ONDANSETRON 4 MG PO TBDP
8.0000 mg | ORAL_TABLET | Freq: Once | ORAL | Status: AC
  Administered 2015-02-15: 8 mg via ORAL
  Filled 2015-02-15: qty 2

## 2015-02-15 MED ORDER — ACETAMINOPHEN 325 MG PO TABS
650.0000 mg | ORAL_TABLET | Freq: Once | ORAL | Status: AC
  Administered 2015-02-15: 650 mg via ORAL
  Filled 2015-02-15: qty 2

## 2015-02-15 NOTE — ED Notes (Signed)
Declined W/C at D/C and was escorted to lobby by RN. 

## 2015-02-15 NOTE — ED Notes (Signed)
Pt returns for recheck for treatment started on 02-12-15 for bronchitis. Pt reports she does not feel better.

## 2015-02-15 NOTE — ED Provider Notes (Signed)
CSN: [id number]     Arrival date & time 02/15/15  6578 History  This chart was scribed for Donetta Potts, NP, working with Elwin Mocha, MD by Elon Spanner, ED Scribe. This patient was seen in room TR07C/TR07C and the patient's care was started at 9:50 AM.   No chief complaint on file.  The history is provided by the patient. No language interpreter was used.   HPI Comments: Rebekah Rhodes is a 19 y.o. female who was seen in the ED 5 days ago for similar complaints and was diagnosed with bronchitis.  She was prescribed prednisone and an albuterol inhaler twice daily.  She presents to the Emergency Department complaining of a cough with associated congestion and sore throat onset 1 week ago.  She also reports a current headache, one episode of post-tussive emesis two days ago and nausea last night and this morning after severe coughing.   Patient is tolerating food and fluids well.  She denies generalized body aches, chills, fever.  Past Medical History  Diagnosis Date  . Asthma   . HA (headache)    Past Surgical History  Procedure Laterality Date  . None     Family History  Problem Relation Age of Onset  . Breast cancer Mother    History  Substance Use Topics  . Smoking status: Never Smoker   . Smokeless tobacco: Never Used  . Alcohol Use: No   OB History    No data available     Review of Systems  Constitutional: Negative for fever and chills.  HENT: Positive for congestion and sore throat.   Respiratory: Positive for cough.   Gastrointestinal: Positive for nausea and vomiting.  Musculoskeletal: Negative for myalgias.      Allergies  Amoxicillin  Home Medications   Prior to Admission medications   Medication Sig Start Date End Date Taking? Authorizing Provider  albuterol (PROVENTIL HFA;VENTOLIN HFA) 108 (90 BASE) MCG/ACT inhaler Inhale 2 puffs into the lungs every 4 (four) hours as needed for wheezing or shortness of breath. 02/12/15   Tatyana Kirichenko, PA-C   benzonatate (TESSALON) 100 MG capsule Take 1 capsule (100 mg total) by mouth every 8 (eight) hours. 02/12/15   Tatyana Kirichenko, PA-C  cetirizine (ZYRTEC) 10 MG tablet Take 10 mg by mouth daily.    Historical Provider, MD  fluticasone (FLONASE) 50 MCG/ACT nasal spray Place 1 spray into both nostrils daily.    Historical Provider, MD  ibuprofen (ADVIL,MOTRIN) 200 MG tablet Take 400 mg by mouth every 6 (six) hours as needed for headache.    Historical Provider, MD  meloxicam (MOBIC) 15 MG tablet Take 1 tablet (15 mg total) by mouth daily. 07/12/14   Reuben Likes, MD  naproxen (NAPROSYN) 500 MG tablet Take 1 tablet (500 mg total) by mouth 2 (two) times daily with a meal. 02/26/14   Garlon Hatchet, PA-C  nortriptyline (PAMELOR) 10 MG capsule One tablet every night for one week, then 2 tablets every night 03/10/14   Levert Feinstein, MD  Olopatadine HCl (PATADAY) 0.2 % SOLN Place 1 drop into both eyes daily.    Historical Provider, MD  predniSONE (DELTASONE) 10 MG tablet Take 5 tab day 1, take 4 tab day 2, take 3 tab day 3, take 2 tab day 4, and take 1 tab day 5 02/12/15   Tatyana Kirichenko, PA-C  rizatriptan (MAXALT-MLT) 5 MG disintegrating tablet Take 1 tablet (5 mg total) by mouth as needed for migraine. May repeat in 2 hours  if needed 03/10/14   Levert Feinstein, MD  traMADol (ULTRAM) 50 MG tablet Take 2 tablets (100 mg total) by mouth every 8 (eight) hours as needed. 07/12/14   Reuben Likes, MD   There were no vitals taken for this visit. Physical Exam  Constitutional: She is oriented to person, place, and time. She appears well-developed and well-nourished. No distress.  HENT:  Head: Normocephalic and atraumatic.  Right Ear: External ear normal.  Left Ear: External ear normal.  Nose: Rhinorrhea present.  Mouth/Throat: Oropharynx is clear and moist.  Left and right TM's clear.  Oropharynx clear; no edema, erythema, or exudate.  No lymphadenopathy.    Eyes: Conjunctivae are normal. Pupils are equal, round,  and reactive to light.  Neck: Normal range of motion. Neck supple. No tracheal deviation present.  Cardiovascular: Normal rate, regular rhythm and intact distal pulses.   S1/S2 normal  Pulmonary/Chest: Effort normal and breath sounds normal. No respiratory distress. She has no wheezes. She has no rales. She exhibits no tenderness.  Lungs clear bilaterally.   Abdominal: Soft.  Musculoskeletal: Normal range of motion.  Lymphadenopathy:    She has no cervical adenopathy.  Neurological: She is alert and oriented to person, place, and time. No cranial nerve deficit. Coordination normal.  Skin: Skin is warm and dry.  Psychiatric: She has a normal mood and affect. Her behavior is normal.  Nursing note and vitals reviewed.   ED Course  Procedures (including critical care time)  DIAGNOSTIC STUDIES: Oxygen Saturation is 97% on RA, normal by my interpretation.    COORDINATION OF CARE:  9:59 AM Discussed treatment plan with patient at bedside.  Patient acknowledges and agrees with plan.    Labs Review Labs Reviewed - No data to display  Imaging Review No results found.   EKG Interpretation None      MDM   Final diagnoses:  Bronchitis  URI (upper respiratory infection)   19 yo with continued non-productive cough and nasal congestion, evaluated for same 3 days ago.  She is a febrile with clear lung sounds and no concern for bacterial source.  Patients symptoms are consistent with viral URI and bronchitis. Discussed that antibiotics are not indicated for viral infections.  She has an albuterol inhaler but teaching done for use with a spacer for effectiveness and to increase use to 2 puffs every 4 hours.  Pt will be discharged with symptomatic treatment.  Pt is well-appearing, in no acute distress and vital signs reviewed and not concerning. She appear safe to be discharged.  Discharge include resources to establish care with a PCP.  Return precautions provided. She verbalizes  understanding and is agreeable with plan.  I personally performed the services described in this documentation, which was scribed in my presence. The recorded information has been reviewed and is accurate.  Filed Vitals:   02/15/15 0949  BP: 112/75  Pulse: 91  Temp: 98.9 F (37.2 C)  TempSrc: Oral  Resp: 18  Height:  (1.702 m)  Weight: 124 lb 4 oz (56.359 kg)  SpO2: 100%   Meds given in ED:  Medications  acetaminophen (TYLENOL) tablet 650 mg (650 mg Oral Given 02/15/15 1014)  AEROCHAMBER PLUS FLO-VU MEDIUM device MISC 1 each (1 each Other Given 02/15/15 1014)  ondansetron (ZOFRAN-ODT) disintegrating tablet 8 mg (8 mg Oral Given 02/15/15 1014)    Discharge Medication List as of 02/15/2015 10:14 AM    START taking these medications   Details  Phenylephrine-DM-GG-APAP (MUCINEX FAST-MAX COLD  FLU) 5-10-200-325 MG TABS Take 2 tablets by mouth 3 (three) times daily., Starting 02/15/2015, Until Discontinued, Print          Harle BattiestElizabeth Lucylle Foulkes, NP 02/15/15 1049  Elwin MochaBlair Walden, MD 02/15/15 [phone]

## 2015-02-15 NOTE — Discharge Instructions (Signed)
Please follow the directions provided. Use the resource guide or the referral given to establish care with a primary care doctor for follow-up. Please use the albuterol inhaler 2 puffs every 4 hours with a spacer as needed for cough or shortness of breath. Please take the multi dose cold medicine 3 times a day to help with other symptoms. Be sure to drink plenty of fluids to stay well hydrated and rest as much as possible. Don't hesitate to return for any new, worsening, or concerning symptoms.   SEEK IMMEDIATE MEDICAL CARE IF:  You develop an increased fever or chills.  You have chest pain.  You have severe shortness of breath.  You have bloody sputum.  You develop dehydration.  You faint or repeatedly feel like you are going to pass out.  You develop repeated vomiting.  You develop a severe headache.   Emergency Department Resource Guide 1) Find a Doctor and Pay Out of Pocket Although you won't have to find out who is covered by your insurance plan, it is a good idea to ask around and get recommendations. You will then need to call the office and see if the doctor you have chosen will accept you as a new patient and what types of options they offer for patients who are self-pay. Some doctors offer discounts or will set up payment plans for their patients who do not have insurance, but you will need to ask so you aren't surprised when you get to your appointment.  2) Contact Your Local Health Department Not all health departments have doctors that can see patients for sick visits, but many do, so it is worth a call to see if yours does. If you don't know where your local health department is, you can check in your phone book. The CDC also has a tool to help you locate your state's health department, and many state websites also have listings of all of their local health departments.  3) Find a Walk-in Clinic If your illness is not likely to be very severe or complicated, you may want to try a  walk in clinic. These are popping up all over the country in pharmacies, drugstores, and shopping centers. They're usually staffed by nurse practitioners or physician assistants that have been trained to treat common illnesses and complaints. They're usually fairly quick and inexpensive. However, if you have serious medical issues or chronic medical problems, these are probably not your best option.  No Primary Care Doctor: - Call Health Connect at  [phone] - they can help you locate a primary care doctor that  accepts your insurance, provides certain services, etc. - Physician Referral Service- [phone]  Chronic Pain Problems: Organization         Address  Phone   Notes  [facility]  [phone] Patients need to be referred by their primary care doctor.   Medication Assistance: Organization         Address  Phone   Notes  [facility] Medication [facility]ssistance Program [address] [place], [place] [address] [phone]) [phone] --Must be a resident of [facility] [place] -- Must have NO insurance coverage whatsoever (no Medicaid/ Medicare, etc.) -- The pt. MUST have a primary care doctor that directs their care regularly and follows them in the community   MedAssist  [phone]   [employer]  [phone]    Agencies that provide inexpensive medical care: Organization  Address  Phone   Notes  [place]  [phone]   [facility]    [phone]   [facility] [place], [place] [address] [phone]   [place] [address] [place] [place] [phone]   Planned Parenthood    [phone]   [facility]    [phone]   [place] and [place] [address], [place] Phone:  [phone], Fax:  [phone] Hours of Operation:  9 am - 6 pm, M-F.  Also accepts Medicaid/Medicare and self-pay.  [facility] for [place], [address], [place] Phone: [phone], Fax: [phone]. Hours of Operation:  8:30 am - 5:30 pm, M-F.  Also accepts Medicaid and self-pay.  [facility] [place] [address] [place] Phone: [phone]   [place], [place], [place] [phone], Ext. [phone] Mondays & Thursdays: 7-9 AM.  First 15 patients are seen on a first come, first serve basis.    [place] Providers:  Organization         Address  Phone   Notes  [facility] [address], [place] [phone] Also accepts self-pay patients.  [facility] [address], [place]  [phone]   [place], [address], [place] [phone]   [facility] [address], [place] [phone]   Lucianne Lei [address], [place]   [phone] Only accepts [place] Access [place] patients after they have their name applied to their card.   Self-Pay (no insurance) in [facility]:  Organization         Address  Phone   Notes  Sickle Cell Patients, [facility] [place] [phone]   [facility] [place] [phone]   [facility] [place]  [place], [place], [place] [phone]   [facility]/Dr. Osei-Bonsu  [address] [place] or [address], [place] [phone] Phone number for both [place] and [place] locations is the same.  [facility] [address], [place] [phone]   [facility] [address], [place] or [address] [phone]   [facility] [address] [place] [phone], phone; [phone], fax Sees  patients 1st and 3rd Saturday of every month.  Must not qualify for public or private insurance (i.e. Medicaid, Medicare, [place] Health Choice, Veterans' Benefits)  Household income should be no more than 200% of the poverty level The clinic cannot treat you if you are pregnant or think you are pregnant  Sexually transmitted diseases are not treated at the clinic.    Dental Care: Organization         Address  Phone  Notes  [facility] Department of [facility] [place] [phone] Accepts children up to age 36 who are enrolled in [place] or [place]; pregnant women with a Medicaid card; and children who have applied for Medicaid or [place] Health Choice, but were declined, whose parents can pay a reduced fee at time  of service.  [facility] Department of [facility]  [address], [place] [phone] Accepts children up to age 59 who are enrolled in [place] or [place]; pregnant women with a Medicaid card; and children who have applied for Medicaid or [place] Health Choice, but were declined, whose parents can pay a reduced fee at time of service.  [facility] Adult [facility] Access PROGRAM  [place] [phone] Patients are seen by appointment only. Walk-ins are not accepted. [place] will see patients 41 years of age and older. Monday - Tuesday (8am-5pm) Most Wednesdays (8:30-5pm) $30 per visit, cash only  [facility] Access PRO[facility]  [address], [facility] [phone] Patients are seen by appointment only. Walk-ins are not accepted. [place] will see patients 59 years of age and older. One Wednesday Evening (Monthly: Volunteer Based).  $30 per visit, cash only  [place]  [phone] for adults; Children under age 48, call Graduate Pediatric Dentistry at [phone]. Children aged 62-14, please call [phone] to request a  pediatric application.  Dental services are provided in all areas of dental care including fillings, crowns and bridges, complete and partial dentures, implants, gum treatment, root canals, and extractions. Preventive care is also provided. Treatment is provided to both adults and children. Patients are selected via a lottery and there is often a waiting list.   [facility] [address] [place]  [phone] [email or web address]   [facility] [address] [place], [place] [phone], Ext. [phone] Second and Fourth Thursday of each month, opens at 6:30 AM; Clinic ends at 9 AM.  Patients are seen on a first-come first-served basis, and a limited number are seen during each clinic.   [facility]  [address] Hillard Danker [place], [place] [phone]   Eligibility Requirements You must have lived in [place], [place], or [place] counties for at least the last three months.   You cannot be eligible for state or federal sponsored [employer], including [employer], [place], or [employer].   You generally cannot be eligible for healthcare insurance through your employer.    How to apply: Eligibility screenings are held every Tuesday and Wednesday afternoon from 1:00 pm until 4:00 pm. You do not need an appointment for the interview!  [facility] [address], [place], [place]   [place]  [place] Department  [place]  [phone]    Behavioral Health Resources in the Community: Intensive Outpatient Programs Organization         Address  Phone  Notes  [place]. [address] [place], [place] [phone]   [facility] Outpatient [address], [place], [place]   ADS: Alcohol & Drug Svcs [address] [place], [place]   [place] [address]  [place], [place] or [phone]   Substance Abuse Resources Organization         Address  Phone  Notes  Alcohol and Drug Services  [phone]   [place]  [phone]   [facility] [place]   Chinita Pester  [phone]   Residential & Outpatient Substance Abuse Program  [phone]   Psychological Services Organization         Address  Phone  Notes  [facility]  [place]  [phone]   [place] [address] [place] or [phone]    Mobile Crisis Teams Organization         Address  Phone  Notes  [facility], Mobile Crisis Care Unit  [phone]   [facility]  [address] [place], [place]   [place] Levels [address], [place] [phone]    Self-Help/Support Groups Organization         Address  Phone             Notes  [place]. of [place] - variety of support groups  [place] Call for more information  Narcotics Anonymous (NA), Caring [facility] [address], [employer] [place]  2 meetings at this location   Special educational needs teacher         Address  Phone  Notes  [facility]idential Treatment [place],    [place]  [phone]-([phone]) [phone]   [facility]  [address] [place] [address], [place], [place]   [place], [place] [phone] Admissions: 8am-3pm M-F  Incentives Substance [place] [address]    [place], [place] [phone]   [facility] [address] [place], [place], [place]   [facility] [address]  [place], [place]   [facility] - Intensive Outpatient [address] Kristeen [address], [place], [place]   [facility] [place].,    [place], [place] [phone] or [phone]   [facility] (RTS) [address] [place], [place] Accepts Medicaid  [facility] [place] [address],  [place] [phone] Substance Abuse/Addiction Treatment   [facility] Organization         Address  Phone  Notes  [facility]  [phone]   Domenic Schwab, PhD [address] [place]lis [place], [place]   [phone] or [phone]   [place] Boyd Haskell [place], [place] [phone]   [facility] [address], [place], [place] [phone] Insurance/Medicaid/sponsorship through [facility] and Families [address]                                    [place], [place] [phone] [place] [address], [place] [phone]    Dr. Adele Schilder  [phone]   [facility] Dept. 1) [address], [place] 2) [place] 3)  [place] [address], [place] [phone]  [phone]   [place] [phone] or [phone] (After Hours)

## 2015-03-20 ENCOUNTER — Emergency Department (INDEPENDENT_AMBULATORY_CARE_PROVIDER_SITE_OTHER)
Admission: EM | Admit: 2015-03-20 | Discharge: 2015-03-20 | Disposition: A | Discharge status: Home / Self Care | Payer: BLUE CROSS/BLUE SHIELD | Source: Home / Self Care | Attending: Family Medicine | Admitting: Family Medicine

## 2015-03-20 ENCOUNTER — Encounter (HOSPITAL_COMMUNITY): Payer: Self-pay | Admitting: Emergency Medicine

## 2015-03-20 DIAGNOSIS — A6000 Herpesviral infection of urogenital system, unspecified: Secondary | ICD-10-CM

## 2015-03-20 LAB — POCT URINALYSIS DIP (DEVICE)
BILIRUBIN URINE: NEGATIVE
Glucose, UA: NEGATIVE mg/dL
KETONES UR: NEGATIVE mg/dL
LEUKOCYTES UA: NEGATIVE
Nitrite: NEGATIVE
Protein, ur: NEGATIVE mg/dL
Specific Gravity, Urine: >=1.03 (ref 1.005–1.030)
Urobilinogen, UA: 0.2 mg/dL (ref 0.0–1.0)
pH: 6 (ref 5.0–8.0)

## 2015-03-20 LAB — POCT PREGNANCY, URINE: Preg Test, Ur: NEGATIVE

## 2015-03-20 MED ORDER — VALACYCLOVIR HCL 1 G PO TABS: 1000.0000 mg | ORAL_TABLET | Freq: Two times a day (BID) | ORAL | Status: AC

## 2015-03-20 NOTE — ED Notes (Signed)
Call back number verified.  

## 2015-03-20 NOTE — ED Notes (Signed)
C/o multiple vaginal sores onset 2 days; would like to be screened for STD Also reports [place] vag d/c Denies fevers, chills, abd pain, urinary sx Alert, no signs of acute distress.

## 2015-03-20 NOTE — Discharge Instructions (Signed)
Thank you for coming in today. Recommend that you follow up with the [facility] Address: [address] [place], [place], [place] [address] [phone]) [phone]  Follow-up in about a week for testing for gonorrhea and Chlamydia.  Genital Herpes Genital herpes is a sexually transmitted disease. This means that it is a disease passed by having sex with an infected person. There is no cure for genital herpes. The time between attacks can be months to years. The virus may live in a person but produce no problems (symptoms). This infection can be passed to a baby as it travels down the birth canal (vagina). In a newborn, this can cause central nervous system damage, eye damage, or even death. The virus that causes genital herpes is usually HSV-2 virus. The virus that causes oral herpes is usually HSV-1. The diagnosis (learning what is wrong) is made through culture results. SYMPTOMS  Usually symptoms of pain and itching begin a few days to a week after contact. It first appears as small blisters that progress to small painful ulcers which then scab over and heal after several days. It affects the outer genitalia, birth canal, cervix, penis, anal area, buttocks, and thighs. HOME CARE INSTRUCTIONS   Keep ulcerated areas dry and clean.  Take medications as directed. Antiviral medications can speed up healing. They will not prevent recurrences or cure this infection. These medications can also be taken for suppression if there are frequent recurrences.  While the infection is active, it is contagious. Avoid all sexual contact during active infections.  Condoms may help prevent spread of the herpes virus.  Practice safe sex.  Wash your hands thoroughly after touching the genital area.  Avoid touching your eyes after touching your genital area.  Inform your caregiver if you have had genital herpes and become pregnant. It is your responsibility to insure a safe outcome for your baby in  this pregnancy.  Only take over-the-counter or prescription medicines for pain, discomfort, or fever as directed by your caregiver. SEEK MEDICAL CARE IF:   You have a recurrence of this infection.  You do not respond to medications and are not improving.  You have new sources of pain or discharge which have changed from the original infection.  You have an oral temperature above 102 F (38.9 C).  You develop abdominal pain.  You develop eye pain or signs of eye infection. Document Released: 10/14/2000 Document Revised: 01/09/2012 Document Reviewed: 11/04/2009 [facility] Patient Information 2015 [place], [place]. This information is not intended to replace advice given to you by your health care provider. Make sure you discuss any questions you have with your health care provider.

## 2015-03-20 NOTE — ED Provider Notes (Signed)
[name] is a 19 y.o. female who presents to Urgent Care today for vaginal lesions and burning pain present for 2 days. She also notes some mild vaginal discharge. No urinary symptoms fevers chills nausea vomiting or diarrhea. No treatment tried. No history of genital herpes.   Past Medical History  Diagnosis Date  . Asthma   . HA (headache)    Past Surgical History  Procedure Laterality Date  . None     History  Substance Use Topics  . Smoking status: Never Smoker   . Smokeless tobacco: Never Used  . Alcohol Use: No   ROS as above Medications: No current facility-administered medications for this encounter.   Current Outpatient Prescriptions  Medication Sig Dispense Refill  . albuterol (PROVENTIL HFA;VENTOLIN HFA) 108 (90 BASE) MCG/ACT inhaler Inhale 2 puffs into the lungs every 4 (four) hours as needed for wheezing or shortness of breath. 1 Inhaler 0  . benzonatate (TESSALON) 100 MG capsule Take 1 capsule (100 mg total) by mouth every 8 (eight) hours. 21 capsule 0  . cetirizine (ZYRTEC) 10 MG tablet Take 10 mg by mouth daily.    . fluticasone (FLONASE) 50 MCG/ACT nasal spray Place 1 spray into both nostrils daily.    Marland Kitchen. ibuprofen (ADVIL,MOTRIN) 200 MG tablet Take 400 mg by mouth every 6 (six) hours as needed for headache.    . meloxicam (MOBIC) 15 MG tablet Take 1 tablet (15 mg total) by mouth daily. 15 tablet 0  . naproxen (NAPROSYN) 500 MG tablet Take 1 tablet (500 mg total) by mouth 2 (two) times daily with a meal. 30 tablet 0  . nortriptyline (PAMELOR) 10 MG capsule One tablet every night for one week, then 2 tablets every night 60 capsule 12  . Olopatadine HCl (PATADAY) 0.2 % SOLN Place 1 drop into both eyes daily.    Marland Kitchen. Phenylephrine-DM-GG-APAP (MUCINEX FAST-MAX COLD FLU) 5-10-200-325 MG TABS Take 2 tablets by mouth 3 (three) times daily. 60 each 0  . predniSONE (DELTASONE) 10 MG tablet Take 5 tab day 1, take 4 tab day 2, take 3 tab day 3, take 2 tab day 4, and take 1  tab day 5 15 tablet 0  . rizatriptan (MAXALT-MLT) 5 MG disintegrating tablet Take 1 tablet (5 mg total) by mouth as needed for migraine. May repeat in 2 hours if needed 15 tablet 12  . traMADol (ULTRAM) 50 MG tablet Take 2 tablets (100 mg total) by mouth every 8 (eight) hours as needed. 30 tablet 0  . valACYclovir (VALTREX) 1000 MG tablet Take 1 tablet (1,000 mg total) by mouth 2 (two) times daily. 20 tablet 0   Allergies  Allergen Reactions  . Amoxicillin     Childhood allergy     Exam:  BP 129/80 mmHg  Pulse 96  Temp(Src) 98.8 F (37.1 C) (Oral)  Resp 16  SpO2 99% Gen: Well NAD HEENT: EOMI,  MMM Lungs: Normal work of breathing. CTABL Heart: RRR no MRG Abd: NABS, Soft. Nondistended, Nontender Exts: Brisk capillary refill, warm and well perfused.  GYN: External genitalia with vesicular lesions on the left labia. No visible discharge. Speculum exam deferred due to pain.  Results for orders placed or performed during the hospital encounter of 03/20/15 (from the past 24 hour(s))  POCT urinalysis dip (device)     Status: Abnormal   Collection Time: 03/20/15 12:10 PM  Result Value Ref Range   Glucose, UA NEGATIVE NEGATIVE mg/dL   Bilirubin Urine NEGATIVE NEGATIVE   Ketones,  ur NEGATIVE NEGATIVE mg/dL   Specific Gravity, Urine >=1.030 1.005 - 1.030   Hgb urine dipstick TRACE (A) NEGATIVE   pH 6.0 5.0 - 8.0   Protein, ur NEGATIVE NEGATIVE mg/dL   Urobilinogen, UA 0.2 0.0 - 1.0 mg/dL   Nitrite NEGATIVE NEGATIVE   Leukocytes, UA NEGATIVE NEGATIVE  Pregnancy, urine POC     Status: None   Collection Time: 03/20/15 12:14 PM  Result Value Ref Range   Preg Test, Ur NEGATIVE NEGATIVE   No results found.  Assessment and Plan: 19 y.o. female with probable genital herpes. HSV viral culture pending. HIV and syphilis test pending. Treat with acyclovir. Follow-up in a week for gonorrhea and chlamydia testing.  Discussed warning signs or symptoms. Please see discharge instructions.  Patient expresses understanding.     Rodolph BongEvan S Iyla Balzarini, MD 03/20/15 1235

## 2015-03-21 LAB — RPR: RPR: NONREACTIVE

## 2015-03-21 LAB — HIV ANTIBODY (ROUTINE TESTING W REFLEX): HIV Screen 4th Generation wRfx: NONREACTIVE

## 2015-03-23 ENCOUNTER — Telehealth (HOSPITAL_COMMUNITY): Payer: Self-pay | Admitting: Family Medicine

## 2015-03-23 LAB — HERPES SIMPLEX VIRUS CULTURE
Culture: DETECTED
SPECIAL REQUESTS: NORMAL

## 2015-03-23 NOTE — ED Notes (Signed)
Patient called back. Discussed positive test results. Recommend Chlamydia and gonorrhea test in the near future.  Rodolph BongEvan S Kveon Casanas, MD 03/23/15 [id number]

## 2015-03-23 NOTE — ED Notes (Signed)
Attempted to contact patient regarding her HSV result. Left message asking for call back.  Rodolph BongEvan S Nohealani Medinger, MD 03/23/15 [phone]

## 2015-10-16 ENCOUNTER — Emergency Department (HOSPITAL_COMMUNITY)
Admission: EM | Admit: 2015-10-16 | Discharge: 2015-10-16 | Disposition: A | Discharge status: Home / Self Care | Payer: BLUE CROSS/BLUE SHIELD | Attending: Emergency Medicine | Admitting: Emergency Medicine

## 2015-10-16 ENCOUNTER — Encounter (HOSPITAL_COMMUNITY): Payer: Self-pay | Admitting: Emergency Medicine

## 2015-10-16 ENCOUNTER — Emergency Department (HOSPITAL_COMMUNITY): Payer: BLUE CROSS/BLUE SHIELD

## 2015-10-16 DIAGNOSIS — J45909 Unspecified asthma, uncomplicated: Secondary | ICD-10-CM | POA: Insufficient documentation

## 2015-10-16 DIAGNOSIS — R197 Diarrhea, unspecified: Secondary | ICD-10-CM | POA: Diagnosis not present

## 2015-10-16 DIAGNOSIS — R112 Nausea with vomiting, unspecified: Secondary | ICD-10-CM | POA: Diagnosis present

## 2015-10-16 DIAGNOSIS — R1031 Right lower quadrant pain: Secondary | ICD-10-CM | POA: Diagnosis not present

## 2015-10-16 DIAGNOSIS — Z3202 Encounter for pregnancy test, result negative: Secondary | ICD-10-CM | POA: Diagnosis not present

## 2015-10-16 DIAGNOSIS — R1013 Epigastric pain: Secondary | ICD-10-CM | POA: Insufficient documentation

## 2015-10-16 DIAGNOSIS — Z880 Allergy status to penicillin: Secondary | ICD-10-CM | POA: Insufficient documentation

## 2015-10-16 LAB — CBC WITH DIFFERENTIAL/PLATELET
Basophils Absolute: 0 10*3/uL (ref 0.0–0.1)
Basophils Relative: 0 %
EOS ABS: 0 10*3/uL (ref 0.0–0.7)
EOS PCT: 0 %
HCT: 40.7 % (ref 36.0–46.0)
Hemoglobin: 13.3 g/dL (ref 12.0–15.0)
Lymphocytes Relative: 14 %
Lymphs Abs: 1 10*3/uL (ref 0.7–4.0)
MCH: 27.3 pg (ref 26.0–34.0)
MCHC: 32.7 g/dL (ref 30.0–36.0)
MCV: 83.6 fL (ref 78.0–100.0)
Monocytes Absolute: 0.7 10*3/uL (ref 0.1–1.0)
Monocytes Relative: 9 %
NEUTROS ABS: 5.6 10*3/uL (ref 1.7–7.7)
Neutrophils Relative %: 77 %
PLATELETS: 204 10*3/uL (ref 150–400)
RBC: 4.87 MIL/uL (ref 3.87–5.11)
RDW: 13.8 % (ref 11.5–15.5)
WBC: 7.3 10*3/uL (ref 4.0–10.5)

## 2015-10-16 LAB — URINALYSIS, ROUTINE W REFLEX MICROSCOPIC
Bilirubin Urine: NEGATIVE
GLUCOSE, UA: NEGATIVE mg/dL
HGB URINE DIPSTICK: NEGATIVE
Ketones, ur: NEGATIVE mg/dL
Leukocytes, UA: NEGATIVE
Nitrite: NEGATIVE
Protein, ur: NEGATIVE mg/dL
SPECIFIC GRAVITY, URINE: 1.028 (ref 1.005–1.030)
pH: 5.5 (ref 5.0–8.0)

## 2015-10-16 LAB — COMPREHENSIVE METABOLIC PANEL
ALT: 20 U/L (ref 14–54)
AST: 24 U/L (ref 15–41)
Albumin: 5.1 g/dL — ABNORMAL HIGH (ref 3.5–5.0)
Alkaline Phosphatase: 67 U/L (ref 38–126)
Anion gap: 10 (ref 5–15)
BUN: 14 mg/dL (ref 6–20)
CHLORIDE: 105 mmol/L (ref 101–111)
CO2: 24 mmol/L (ref 22–32)
CREATININE: 0.68 mg/dL (ref 0.44–1.00)
Calcium: 9.4 mg/dL (ref 8.9–10.3)
GFR calc Af Amer: >60 mL/min (ref >60)
GFR calc non Af Amer: >60 mL/min (ref >60)
Glucose, Bld: 113 mg/dL — ABNORMAL HIGH (ref 65–99)
Potassium: 4 mmol/L (ref 3.5–5.1)
SODIUM: 139 mmol/L (ref 135–145)
Total Bilirubin: 1.1 mg/dL (ref 0.3–1.2)
Total Protein: 8.2 g/dL — ABNORMAL HIGH (ref 6.5–8.1)

## 2015-10-16 LAB — I-STAT BETA HCG BLOOD, ED (MC, WL, AP ONLY): I-stat hCG, quantitative: <5 m[IU]/mL (ref <5)

## 2015-10-16 LAB — LIPASE, BLOOD: Lipase: 24 U/L (ref 11–51)

## 2015-10-16 MED ORDER — HYDROCODONE-ACETAMINOPHEN 5-325 MG PO TABS: ORAL_TABLET | ORAL | Status: DC

## 2015-10-16 MED ORDER — ONDANSETRON HCL 4 MG/2ML IJ SOLN
4.0000 mg | Freq: Once | INTRAMUSCULAR | Status: AC
  Administered 2015-10-16: 4 mg via INTRAVENOUS
  Filled 2015-10-16: qty 2

## 2015-10-16 MED ORDER — FAMOTIDINE IN NACL 20-0.9 MG/50ML-% IV SOLN
20.0000 mg | Freq: Once | INTRAVENOUS | Status: AC
  Administered 2015-10-16: 20 mg via INTRAVENOUS
  Filled 2015-10-16: qty 50

## 2015-10-16 MED ORDER — MORPHINE SULFATE (PF) 4 MG/ML IV SOLN
4.0000 mg | Freq: Once | INTRAVENOUS | Status: AC
  Administered 2015-10-16: 4 mg via INTRAVENOUS
  Filled 2015-10-16: qty 1

## 2015-10-16 MED ORDER — IOHEXOL 300 MG/ML  SOLN
100.0000 mL | Freq: Once | INTRAMUSCULAR | Status: AC | PRN
  Administered 2015-10-16: 100 mL via INTRAVENOUS

## 2015-10-16 MED ORDER — DICYCLOMINE HCL 10 MG PO CAPS
10.0000 mg | ORAL_CAPSULE | Freq: Once | ORAL | Status: AC
  Administered 2015-10-16: 10 mg via ORAL
  Filled 2015-10-16: qty 1

## 2015-10-16 MED ORDER — ONDANSETRON HCL 4 MG PO TABS: 4.0000 mg | ORAL_TABLET | Freq: Three times a day (TID) | ORAL | Status: DC | PRN

## 2015-10-16 MED ORDER — IOHEXOL 300 MG/ML  SOLN
50.0000 mL | Freq: Once | INTRAMUSCULAR | Status: AC | PRN
  Administered 2015-10-16: 50 mL via ORAL

## 2015-10-16 MED ORDER — DICYCLOMINE HCL 20 MG PO TABS: 20.0000 mg | ORAL_TABLET | Freq: Two times a day (BID) | ORAL | Status: DC

## 2015-10-16 NOTE — ED Notes (Signed)
Family at bedside. 

## 2015-10-16 NOTE — ED Notes (Signed)
Lab delay - pt actively vomiting.  

## 2015-10-16 NOTE — ED Notes (Signed)
Contrast given to patient. Will be ready for CT in 1 hr. 30 mins.

## 2015-10-16 NOTE — ED Notes (Signed)
Patient transported to CT 

## 2015-10-16 NOTE — ED Provider Notes (Signed)
CSN: [id number]     Arrival date & time 10/16/15  0533 History   None    Chief Complaint  Patient presents with  . Abdominal Pain  . Emesis     (Consider location/radiation/quality/duration/timing/severity/associated sxs/prior Treatment) HPI   Blood pressure 116/88, pulse 105, temperature 98.4 F (36.9 C), temperature source Oral, resp. rate 14, SpO2 100 %.  Rebekah Rhodes is a 19 y.o. female complaining of 9 out of 10 epigastric abdominal pain onset yesterday followed with multiple episodes of nonbloody, nonbilious, no coffee-ground emesis. Patient had one episode of diarrhea, she denies sick contacts however, she works at a daycare. She denies fever, chills, change in urination, abnormal vaginal discharge, history of abdominal surgeries.   Past Medical History  Diagnosis Date  . Asthma   . HA (headache)    Past Surgical History  Procedure Laterality Date  . None    . Wisdom tooth extraction     Family History  Problem Relation Age of Onset  . Breast cancer Mother    Social History  Substance Use Topics  . Smoking status: Never Smoker   . Smokeless tobacco: Never Used  . Alcohol Use: No   OB History    No data available     Review of Systems  10 systems reviewed and found to be negative, except as noted in the HPI.  Allergies  Amoxicillin and Penicillins  Home Medications   Prior to Admission medications   Medication Sig Start Date End Date Taking? Authorizing Provider  albuterol (PROVENTIL HFA;VENTOLIN HFA) 108 (90 BASE) MCG/ACT inhaler Inhale 2 puffs into the lungs every 4 (four) hours as needed for wheezing or shortness of breath. 02/12/15  Yes Tatyana Kirichenko, PA-C  etonogestrel (NEXPLANON) 68 MG IMPL implant 1 each by Subdermal route once.   Yes Historical Provider, MD  FLUoxetine (PROZAC) 10 MG capsule Take 20 mg by mouth daily. 08/27/15 08/26/16 Yes Historical Provider, MD  ibuprofen (ADVIL,MOTRIN) 200 MG tablet Take 400 mg by mouth every 6 (six)  hours as needed for headache.   Yes Historical Provider, MD  benzonatate (TESSALON) 100 MG capsule Take 1 capsule (100 mg total) by mouth every 8 (eight) hours. Patient not taking: Reported on 10/16/2015 02/12/15   Tatyana Kirichenko, PA-C  meloxicam (MOBIC) 15 MG tablet Take 1 tablet (15 mg total) by mouth daily. Patient not taking: Reported on 10/16/2015 07/12/14   Reuben Likes, MD  naproxen (NAPROSYN) 500 MG tablet Take 1 tablet (500 mg total) by mouth 2 (two) times daily with a meal. Patient not taking: Reported on 10/16/2015 02/26/14   Garlon Hatchet, PA-C  nortriptyline (PAMELOR) 10 MG capsule One tablet every night for one week, then 2 tablets every night Patient not taking: Reported on 10/16/2015 03/10/14   Levert Feinstein, MD  Phenylephrine-DM-GG-APAP (MUCINEX FAST-MAX COLD FLU) 5-10-200-325 MG TABS Take 2 tablets by mouth 3 (three) times daily. Patient not taking: Reported on 10/16/2015 02/15/15   Harle Battiest, NP  predniSONE (DELTASONE) 10 MG tablet Take 5 tab day 1, take 4 tab day 2, take 3 tab day 3, take 2 tab day 4, and take 1 tab day 5 Patient not taking: Reported on 10/16/2015 02/12/15   Tatyana Kirichenko, PA-C  rizatriptan (MAXALT-MLT) 5 MG disintegrating tablet Take 1 tablet (5 mg total) by mouth as needed for migraine. May repeat in 2 hours if needed Patient not taking: Reported on 10/16/2015 03/10/14   Levert Feinstein, MD  traMADol (ULTRAM) 50 MG tablet Take 2  tablets (100 mg total) by mouth every 8 (eight) hours as needed. Patient not taking: Reported on 10/16/2015 07/12/14   Reuben Likesavid C Keller, MD   BP 116/88 mmHg  Pulse 105  Temp(Src) 98.4 F (36.9 C) (Oral)  Resp 14  SpO2 100% Physical Exam  Constitutional: She is oriented to person, place, and time. She appears well-developed and well-nourished. No distress.  HENT:  Head: Normocephalic.  Mouth/Throat: Oropharynx is clear and moist.  Eyes: Conjunctivae and EOM are normal. Pupils are equal, round, and reactive to light.   Cardiovascular: Normal rate and regular rhythm.   Pulmonary/Chest: Effort normal and breath sounds normal. No stridor.  Abdominal: Soft. Bowel sounds are normal. She exhibits no distension and no mass. There is tenderness. There is no rebound and no guarding.  Mild right lower quadrant abdominal pain, Rovsing negative, so as is negative, obturator are positive.  Musculoskeletal: Normal range of motion.  Neurological: She is alert and oriented to person, place, and time.  Psychiatric: She has a normal mood and affect.  Nursing note and vitals reviewed.   ED Course  Procedures (including critical care time) Labs Review Labs Reviewed  CBC WITH DIFFERENTIAL/PLATELET  COMPREHENSIVE METABOLIC PANEL  LIPASE, BLOOD  URINALYSIS, ROUTINE W REFLEX MICROSCOPIC (NOT AT [facility])  I-STAT BETA HCG BLOOD, ED ([facility], [facility], [facility] ONLY)    Imaging Review No results found. I have personally reviewed and evaluated these images and lab results as part of my medical decision-making.   EKG Interpretation None      MDM   Final diagnoses:  Nausea vomiting and diarrhea  Epigastric pain    Filed Vitals:   10/16/15 0542  BP: 116/88  Pulse: 105  Temp: 98.4 F (36.9 C)  TempSrc: Oral  Resp: 14  SpO2: 100%    Medications  ondansetron (ZOFRAN) injection 4 mg (4 mg Intravenous Given 10/16/15 0626)  morphine 4 MG/ML injection 4 mg (4 mg Intravenous Given 10/16/15 0649)  iohexol (OMNIPAQUE) 300 MG/ML solution 50 mL (50 mLs Oral Contrast Given 10/16/15 0700)    Rebekah Rhodes is 19 y.o. female presenting with severe abdominal pain multiple episodes of emesis and single episode of diarrhea onset yesterday. Pain started before the emesis, she rates her pain is severe, she describes it as epigastric however on my exam she is more tender in the right lower quadrant. May be an early appendicitis, she is mildly tachycardic. Will check basic blood work and CT.  Blood work reassuring, repeat abdominal exam  unchanged.  CT with no acute findings, the appendix is not well-visualized however there is no periappendiceal inflammation to suggest appendicitis. Extensive discussion of return precautions and patient verbalizes her understanding.  Evaluation does not show pathology that would require ongoing emergent intervention or inpatient treatment. Pt is hemodynamically stable and mentating appropriately. Discussed findings and plan with patient/guardian, who agrees with care plan. All questions answered. Return precautions discussed and outpatient follow up given.   New Prescriptions   DICYCLOMINE (BENTYL) 20 MG TABLET    Take 1 tablet (20 mg total) by mouth 2 (two) times daily.   HYDROCODONE-ACETAMINOPHEN (NORCO/VICODIN) 5-325 MG TABLET    Take 1-2 tablets by mouth every 6 hours as needed for pain and/or cough.   ONDANSETRON (ZOFRAN) 4 MG TABLET    Take 1 tablet (4 mg total) by mouth every 8 (eight) hours as needed for nausea or vomiting.         Wynetta Emeryicole Dawnn Nam, PA-C 10/16/15 [id number]  Lorre NickAnthony Allen, MD 10/19/15  2243 

## 2015-10-16 NOTE — ED Notes (Signed)
Pt states she had abd pain all day yesterday and last night started having vomiting and diarrhea  Pt states she has been vomiting all night

## 2015-10-16 NOTE — Discharge Instructions (Signed)
Push fluids: take small frequent sips of water or Gatorade, do not drink any soda, juice or caffeinated beverages.    Slowly resume solid diet as desired. Avoid food that are spicy, contain dairy and/or have high fat content.  Aviod NSAIDs (aspirin, motrin, ibuprofen, naproxen, Aleve et Karie Sodacetera) for pain control because they will irritate your stomach.  Take vicodin for breakthrough pain, do not drink alcohol, drive, care for children or do other critical tasks while taking vicodin.   Abdominal Pain, Adult Many things can cause abdominal pain. Usually, abdominal pain is not caused by a disease and will improve without treatment. It can often be observed and treated at home. Your health care provider will do a physical exam and possibly order blood tests and X-rays to help determine the seriousness of your pain. However, in many cases, more time must pass before a clear cause of the pain can be found. Before that point, your health care provider may not know if you need more testing or further treatment. HOME CARE INSTRUCTIONS Monitor your abdominal pain for any changes. The following actions may help to alleviate any discomfort you are experiencing:  Only take over-the-counter or prescription medicines as directed by your health care provider.  Do not take laxatives unless directed to do so by your health care provider.  Try a clear liquid diet (broth, tea, or water) as directed by your health care provider. Slowly move to a bland diet as tolerated. SEEK MEDICAL CARE IF:  You have unexplained abdominal pain.  You have abdominal pain associated with nausea or diarrhea.  You have pain when you urinate or have a bowel movement.  You experience abdominal pain that wakes you in the night.  You have abdominal pain that is worsened or improved by eating food.  You have abdominal pain that is worsened with eating fatty foods.  You have a fever. SEEK IMMEDIATE MEDICAL CARE IF:  Your pain  does not go away within 2 hours.  You keep throwing up (vomiting).  Your pain is felt only in portions of the abdomen, such as the right side or the left lower portion of the abdomen.  You pass bloody or black tarry stools. MAKE SURE YOU:  Understand these instructions.  Will watch your condition.  Will get help right away if you are not doing well or get worse.   This information is not intended to replace advice given to you by your health care provider. Make sure you discuss any questions you have with your health care provider.   Document Released: 07/27/2005 Document Revised: 07/08/2015 Document Reviewed: 06/26/2013 Elsevier Interactive Patient Education [employer]2016 Elsevier Inc.

## 2015-10-16 NOTE — ED Notes (Signed)
MD at bedside. 

## 2015-11-09 ENCOUNTER — Other Ambulatory Visit: Payer: Self-pay | Admitting: Physician Assistant

## 2015-11-18 ENCOUNTER — Other Ambulatory Visit: Payer: Self-pay

## 2015-11-18 DIAGNOSIS — Z1231 Encounter for screening mammogram for malignant neoplasm of breast: Secondary | ICD-10-CM

## 2015-11-18 DIAGNOSIS — Z803 Family history of malignant neoplasm of breast: Secondary | ICD-10-CM

## 2015-12-02 ENCOUNTER — Ambulatory Visit: Payer: BLUE CROSS/BLUE SHIELD

## 2015-12-03 ENCOUNTER — Ambulatory Visit
Admission: RE | Admit: 2015-12-03 | Discharge: 2015-12-03 | Disposition: A | Discharge status: Home / Self Care | Payer: BLUE CROSS/BLUE SHIELD | Source: Ambulatory Visit

## 2015-12-03 DIAGNOSIS — Z1231 Encounter for screening mammogram for malignant neoplasm of breast: Secondary | ICD-10-CM

## 2015-12-03 DIAGNOSIS — Z803 Family history of malignant neoplasm of breast: Secondary | ICD-10-CM

## 2016-02-02 ENCOUNTER — Encounter (HOSPITAL_COMMUNITY): Payer: Self-pay

## 2016-02-02 ENCOUNTER — Inpatient Hospital Stay (HOSPITAL_COMMUNITY)
Admission: AD | Admit: 2016-02-02 | Discharge: 2016-02-02 | Disposition: A | Discharge status: Home / Self Care | Payer: BLUE CROSS/BLUE SHIELD | Source: Ambulatory Visit | Attending: Obstetrics & Gynecology | Admitting: Obstetrics & Gynecology

## 2016-02-02 DIAGNOSIS — Z880 Allergy status to penicillin: Secondary | ICD-10-CM | POA: Insufficient documentation

## 2016-02-02 DIAGNOSIS — Z803 Family history of malignant neoplasm of breast: Secondary | ICD-10-CM | POA: Insufficient documentation

## 2016-02-02 DIAGNOSIS — Z3202 Encounter for pregnancy test, result negative: Secondary | ICD-10-CM | POA: Insufficient documentation

## 2016-02-02 DIAGNOSIS — R109 Unspecified abdominal pain: Secondary | ICD-10-CM | POA: Diagnosis not present

## 2016-02-02 DIAGNOSIS — R51 Headache: Secondary | ICD-10-CM | POA: Insufficient documentation

## 2016-02-02 DIAGNOSIS — N921 Excessive and frequent menstruation with irregular cycle: Secondary | ICD-10-CM

## 2016-02-02 DIAGNOSIS — N939 Abnormal uterine and vaginal bleeding, unspecified: Secondary | ICD-10-CM | POA: Diagnosis not present

## 2016-02-02 DIAGNOSIS — Z9889 Other specified postprocedural states: Secondary | ICD-10-CM | POA: Diagnosis not present

## 2016-02-02 DIAGNOSIS — Z7951 Long term (current) use of inhaled steroids: Secondary | ICD-10-CM | POA: Diagnosis not present

## 2016-02-02 DIAGNOSIS — J45909 Unspecified asthma, uncomplicated: Secondary | ICD-10-CM | POA: Insufficient documentation

## 2016-02-02 DIAGNOSIS — Z975 Presence of (intrauterine) contraceptive device: Secondary | ICD-10-CM | POA: Diagnosis not present

## 2016-02-02 LAB — URINALYSIS, ROUTINE W REFLEX MICROSCOPIC
Bilirubin Urine: NEGATIVE
GLUCOSE, UA: NEGATIVE mg/dL
KETONES UR: NEGATIVE mg/dL
LEUKOCYTES UA: NEGATIVE
NITRITE: NEGATIVE
PROTEIN: NEGATIVE mg/dL
Specific Gravity, Urine: 1.015 (ref 1.005–1.030)
pH: 7 (ref 5.0–8.0)

## 2016-02-02 LAB — WET PREP, GENITAL
CLUE CELLS WET PREP: NONE SEEN
Sperm: NONE SEEN
TRICH WET PREP: NONE SEEN
Yeast Wet Prep HPF POC: NONE SEEN

## 2016-02-02 LAB — CBC
HCT: 35.7 % — ABNORMAL LOW (ref 36.0–46.0)
Hemoglobin: 11.9 g/dL — ABNORMAL LOW (ref 12.0–15.0)
MCH: 27.3 pg (ref 26.0–34.0)
MCHC: 33.3 g/dL (ref 30.0–36.0)
MCV: 81.9 fL (ref 78.0–100.0)
PLATELETS: 219 10*3/uL (ref 150–400)
RBC: 4.36 MIL/uL (ref 3.87–5.11)
RDW: 13.6 % (ref 11.5–15.5)
WBC: 5 10*3/uL (ref 4.0–10.5)

## 2016-02-02 LAB — URINE MICROSCOPIC-ADD ON

## 2016-02-02 LAB — POCT PREGNANCY, URINE: PREG TEST UR: NEGATIVE

## 2016-02-02 NOTE — Discharge Instructions (Signed)
Contraception Choices  Birth control (contraception) is the use of any methods or devices to stop pregnancy from happening. Below are some methods to help avoid pregnancy.  HORMONAL BIRTH CONTROL  · A small tube put under the skin of the upper arm (implant). The tube can stay in place for 3 years. The implant must be taken out after 3 years.  · Shots given every 3 months.  · Pills taken every day.  · Patches that are changed once a week.  · A ring put into the vagina (vaginal ring). The ring is left in place for 3 weeks and removed for 1 week. Then, a new ring is put in the vagina.  · Emergency birth control pills taken after unprotected sex (intercourse).  BARRIER BIRTH CONTROL   · A thin covering worn on the penis (female condom) during sex.  · A soft, loose covering put into the vagina (female condom) before sex.  · A rubber bowl that sits over the cervix (diaphragm). The bowl must be made for you. The bowl is put into the vagina before sex. The bowl is left in place for 6 to 8 hours after sex.  · A small, soft cup that fits over the cervix (cervical cap). The cup must be made for you. The cup can be left in place for 48 hours after sex.  · A sponge that is put into the vagina before sex.  · A chemical that kills or stops sperm from getting into the cervix and uterus (spermicide). The chemical may be a cream, jelly, foam, or pill.  INTRAUTERINE (IUD) BIRTH CONTROL   · IUD birth control is a small, T-shaped piece of plastic. The plastic is put inside the uterus. There are 2 types of IUD:    Copper IUD. The IUD is covered in copper wire. The copper makes a fluid that kills sperm. It can stay in place for 10 years.    Hormone IUD. The hormone stops pregnancy from happening. It can stay in place for 5 years.  PERMANENT METHODS  · When the woman has her fallopian tubes sealed, tied, or blocked during surgery. This stops the egg from traveling to the uterus.  · The doctor places a small coil or insert into each fallopian  tube. This causes scar tissue to form and blocks the fallopian tubes.  · When the female has the tubes that carry sperm tied off (vasectomy).  NATURAL FAMILY PLANNING BIRTH CONTROL   · Natural family planning means not having sex or using barrier birth control on the days the woman could become pregnant.  · Use a calendar to keep track of the length of each period and know the days she can get pregnant.  · Avoid sex during ovulation.  · Use a thermometer to measure body temperature. Also watch for symptoms of ovulation.  · Time sex to be after the woman has ovulated.  Use condoms to help protect yourself against sexually transmitted infections (STIs). Do this no matter what type of birth control you use. Talk to your doctor about which type of birth control is best for you.     This information is not intended to replace advice given to you by your health care provider. Make sure you discuss any questions you have with your health care provider.     Document Released: 08/14/2009 Document Revised: 10/22/2013 Document Reviewed: 05/08/2013  Elsevier Interactive Patient Education ©2016 Elsevier Inc.

## 2016-02-02 NOTE — MAU Provider Note (Signed)
History     CSN: [id number]  Arrival date and time: 02/02/16 [id number]   First Provider Initiated Contact with Patient 02/02/16 2011      Chief Complaint  Patient presents with  . Vaginal Bleeding   HPI [name] is a 20 y.o. G0P0000 who presents to MAU today with complaint of vaginal bleeding x 10-14 days. The patient states that she has had Nexplanon x 2 years. She has always had irregular spotting, but denies period like bleeding until this episode. She states that it has been regular flow and sometimes heavier. She also states intermittent lower abdominal cramping. She rates her pain at 7/10 now. She took Tylenol earlier today with good relief. She denies N/V/D, UTI symptoms, fever, weakness or dizziness. She is sexually active and uses condoms sometimes.   OB History    Gravida Para Term Preterm AB TAB SAB Ectopic Multiple Living   0 0 0 0 0 0 0 0 0 0       Past Medical History  Diagnosis Date  . Asthma   . HA (headache)     Past Surgical History  Procedure Laterality Date  . None    . Wisdom tooth extraction      Family History  Problem Relation Age of Onset  . Breast cancer Mother     Social History  Substance Use Topics  . Smoking status: Never Smoker   . Smokeless tobacco: Never Used  . Alcohol Use: No    Allergies:  Allergies  Allergen Reactions  . Amoxicillin Hives, Diarrhea and Nausea And Vomiting    Childhood allergy  . Penicillins Hives, Diarrhea and Nausea And Vomiting    Has patient had a PCN reaction causing immediate rash, facial/tongue/throat swelling, SOB or lightheadedness with hypotension: Yes Has patient had a PCN reaction causing severe rash involving mucus membranes or skin necrosis: No Has patient had a PCN reaction that required hospitalization No Has patient had a PCN reaction occurring within the last 10 years: No If all of the above answers are "NO", then may proceed with Cephalosporin    Prescriptions prior to admission   Medication Sig Dispense Refill Last Dose  . acetaminophen (TYLENOL) 500 MG tablet Take 500 mg by mouth every 6 (six) hours as needed for mild pain or headache.   02/02/2016 at Unknown time  . cetirizine (ZYRTEC) 10 MG tablet Take 10 mg by mouth daily.  0 Past Month at Unknown time  . traZODone (DESYREL) 50 MG tablet Take 50 mg by mouth at bedtime.  0 02/01/2016 at Unknown time  . valACYclovir (VALTREX) 1000 MG tablet Take 1,000 mg by mouth 2 (two) times daily.  0 Past Month at Unknown time  . albuterol (PROVENTIL HFA;VENTOLIN HFA) 108 (90 BASE) MCG/ACT inhaler Inhale 2 puffs into the lungs every 4 (four) hours as needed for wheezing or shortness of breath. 1 Inhaler 0 Rescue  . dicyclomine (BENTYL) 20 MG tablet Take 1 tablet (20 mg total) by mouth 2 (two) times daily. (Patient not taking: Reported on 02/02/2016) 20 tablet 0 Not Taking at Unknown time  . etonogestrel (NEXPLANON) 68 MG IMPL implant 1 each by Subdermal route once.   Continuous  . HYDROcodone-acetaminophen (NORCO/VICODIN) 5-325 MG tablet Take 1-2 tablets by mouth every 6 hours as needed for pain and/or cough. (Patient not taking: Reported on 02/02/2016) 7 tablet 0 Not Taking at Unknown time  . ondansetron (ZOFRAN) 4 MG tablet Take 1 tablet (4 mg total) by mouth every 8 (  eight) hours as needed for nausea or vomiting. (Patient not taking: Reported on 02/02/2016) 10 tablet 0 Not Taking at Unknown time    Review of Systems  Constitutional: Negative for fever and malaise/fatigue.  Gastrointestinal: Positive for abdominal pain and constipation. Negative for nausea, vomiting and diarrhea.  Genitourinary: Negative for dysuria, urgency and frequency.       + vaginal bleeding   Physical Exam   Blood pressure 132/80, pulse 87, temperature 98.3 F (36.8 C), temperature source Oral, resp. rate 18, last menstrual period 01/25/2016.  Physical Exam  Nursing note and vitals reviewed. Constitutional: She is oriented to person, place, and time. She  appears well-developed and well-nourished. No distress.  HENT:  Head: Normocephalic and atraumatic.  Cardiovascular: Normal rate.   Respiratory: Effort normal.  GI: Soft. She exhibits no distension and no mass. There is no tenderness. There is no rebound and no guarding.  Genitourinary: Uterus is tender (mild). Uterus is not enlarged. Cervix exhibits no motion tenderness, no discharge and no friability. Right adnexum displays no mass and no tenderness. Left adnexum displays no mass and no tenderness. There is bleeding (small amount of dark brown blood noted) in the vagina. No vaginal discharge found.  Neurological: She is alert and oriented to person, place, and time.  Skin: Skin is warm and dry. No erythema.  Psychiatric: She has a normal mood and affect.     Results for orders placed or performed during the hospital encounter of 02/02/16 (from the past 24 hour(s))  Urinalysis, Routine w reflex microscopic (not at [facility])     Status: Abnormal   Collection Time: 02/02/16  7:43 PM  Result Value Ref Range   Color, Urine YELLOW YELLOW   APPearance CLEAR CLEAR   Specific Gravity, Urine 1.015 1.005 - 1.030   pH 7.0 5.0 - 8.0   Glucose, UA NEGATIVE NEGATIVE mg/dL   Hgb urine dipstick MODERATE (A) NEGATIVE   Bilirubin Urine NEGATIVE NEGATIVE   Ketones, ur NEGATIVE NEGATIVE mg/dL   Protein, ur NEGATIVE NEGATIVE mg/dL   Nitrite NEGATIVE NEGATIVE   Leukocytes, UA NEGATIVE NEGATIVE  Urine microscopic-add on     Status: Abnormal   Collection Time: 02/02/16  7:43 PM  Result Value Ref Range   Squamous Epithelial / LPF 0-5 (A) NONE SEEN   WBC, UA 0-5 0 - 5 WBC/hpf   RBC / HPF 0-5 0 - 5 RBC/hpf   Bacteria, UA RARE (A) NONE SEEN   Urine-Other MUCOUS PRESENT   Pregnancy, urine POC     Status: None   Collection Time: 02/02/16  7:52 PM  Result Value Ref Range   Preg Test, Ur NEGATIVE NEGATIVE  CBC     Status: Abnormal   Collection Time: 02/02/16  8:16 PM  Result Value Ref Range   WBC 5.0 4.0 -  10.5 K/uL   RBC 4.36 3.87 - 5.11 MIL/uL   Hemoglobin 11.9 (L) 12.0 - 15.0 g/dL   HCT 45.4 (L) 09.8 - 11.9 %   MCV 81.9 78.0 - 100.0 fL   MCH 27.3 26.0 - 34.0 pg   MCHC 33.3 30.0 - 36.0 g/dL   RDW 14.7 82.9 - 56.2 %   Platelets 219 150 - 400 K/uL  Wet prep, genital     Status: Abnormal   Collection Time: 02/02/16  8:35 PM  Result Value Ref Range   Yeast Wet Prep HPF POC NONE SEEN NONE SEEN   Trich, Wet Prep NONE SEEN NONE SEEN   Clue Cells Wet  Prep HPF POC NONE SEEN NONE SEEN   WBC, Wet Prep HPF POC FEW (A) NONE SEEN   Sperm NONE SEEN      MAU Course  Procedures None  MDM UPT - negative UA, wet prep, GC/Chlamydia, RPR, HIV and CBC today   Assessment and Plan  A: Breakthrough bleeding with Implant   P: Discharge home Bleeding precautions discussed Patient advised to follow-up with [facility] or [facility] to discuss change in birth control if bleeding does not resolve Patient may return to MAU as needed or if her condition were to change or worsen   Marny Lowenstein, PA-C  02/02/2016, 9:00 PM

## 2016-02-02 NOTE — MAU Note (Signed)
Pt present complaining of vaginal bleeding x2 weeks. Has irregular periods because of implant in arm. LMP 3/27. Also having lower abdominal cramping.

## 2016-02-03 LAB — RPR: RPR: NONREACTIVE

## 2016-02-03 LAB — HIV ANTIBODY (ROUTINE TESTING W REFLEX): HIV SCREEN 4TH GENERATION: NONREACTIVE

## 2016-02-03 LAB — GC/CHLAMYDIA PROBE AMP (~~LOC~~) NOT AT ARMC
CHLAMYDIA, DNA PROBE: NEGATIVE
NEISSERIA GONORRHEA: NEGATIVE

## 2016-05-28 ENCOUNTER — Emergency Department (HOSPITAL_COMMUNITY)
Admission: EM | Admit: 2016-05-28 | Discharge: 2016-05-28 | Disposition: A | Discharge status: Home / Self Care | Payer: BLUE CROSS/BLUE SHIELD | Attending: Emergency Medicine | Admitting: Emergency Medicine

## 2016-05-28 ENCOUNTER — Encounter (HOSPITAL_COMMUNITY): Payer: Self-pay | Admitting: Emergency Medicine

## 2016-05-28 DIAGNOSIS — R112 Nausea with vomiting, unspecified: Secondary | ICD-10-CM | POA: Insufficient documentation

## 2016-05-28 DIAGNOSIS — F17200 Nicotine dependence, unspecified, uncomplicated: Secondary | ICD-10-CM | POA: Insufficient documentation

## 2016-05-28 DIAGNOSIS — J45909 Unspecified asthma, uncomplicated: Secondary | ICD-10-CM | POA: Insufficient documentation

## 2016-05-28 DIAGNOSIS — R1030 Lower abdominal pain, unspecified: Secondary | ICD-10-CM | POA: Diagnosis present

## 2016-05-28 DIAGNOSIS — R197 Diarrhea, unspecified: Secondary | ICD-10-CM | POA: Insufficient documentation

## 2016-05-28 DIAGNOSIS — Z79899 Other long term (current) drug therapy: Secondary | ICD-10-CM | POA: Insufficient documentation

## 2016-05-28 LAB — COMPREHENSIVE METABOLIC PANEL
ALBUMIN: 4.1 g/dL (ref 3.5–5.0)
ALK PHOS: 63 U/L (ref 38–126)
ALT: 12 U/L — ABNORMAL LOW (ref 14–54)
AST: 18 U/L (ref 15–41)
Anion gap: 8 (ref 5–15)
BILIRUBIN TOTAL: 1 mg/dL (ref 0.3–1.2)
BUN: 13 mg/dL (ref 6–20)
CALCIUM: 9.4 mg/dL (ref 8.9–10.3)
CO2: 23 mmol/L (ref 22–32)
Chloride: 106 mmol/L (ref 101–111)
Creatinine, Ser: 0.72 mg/dL (ref 0.44–1.00)
GFR calc Af Amer: >60 mL/min (ref >60)
GFR calc non Af Amer: >60 mL/min (ref >60)
GLUCOSE: 94 mg/dL (ref 65–99)
Potassium: 4.3 mmol/L (ref 3.5–5.1)
SODIUM: 137 mmol/L (ref 135–145)
TOTAL PROTEIN: 7.4 g/dL (ref 6.5–8.1)

## 2016-05-28 LAB — LIPASE, BLOOD: Lipase: 16 U/L (ref 11–51)

## 2016-05-28 LAB — URINALYSIS, ROUTINE W REFLEX MICROSCOPIC
BILIRUBIN URINE: NEGATIVE
Glucose, UA: NEGATIVE mg/dL
HGB URINE DIPSTICK: NEGATIVE
KETONES UR: NEGATIVE mg/dL
Leukocytes, UA: NEGATIVE
NITRITE: NEGATIVE
PH: 8 (ref 5.0–8.0)
Protein, ur: NEGATIVE mg/dL
SPECIFIC GRAVITY, URINE: 1.024 (ref 1.005–1.030)

## 2016-05-28 LAB — WET PREP, GENITAL
Clue Cells Wet Prep HPF POC: NONE SEEN
SPERM: NONE SEEN
Trich, Wet Prep: NONE SEEN
WBC WET PREP: NONE SEEN
YEAST WET PREP: NONE SEEN

## 2016-05-28 LAB — CBC
HEMATOCRIT: 38.6 % (ref 36.0–46.0)
Hemoglobin: 12.7 g/dL (ref 12.0–15.0)
MCH: 26.9 pg (ref 26.0–34.0)
MCHC: 32.9 g/dL (ref 30.0–36.0)
MCV: 81.8 fL (ref 78.0–100.0)
Platelets: 259 10*3/uL (ref 150–400)
RBC: 4.72 MIL/uL (ref 3.87–5.11)
RDW: 13.1 % (ref 11.5–15.5)
WBC: 7.4 10*3/uL (ref 4.0–10.5)

## 2016-05-28 LAB — POC URINE PREG, ED: Preg Test, Ur: NEGATIVE

## 2016-05-28 MED ORDER — ONDANSETRON HCL 4 MG PO TABS: 4.0000 mg | ORAL_TABLET | Freq: Four times a day (QID) | ORAL | 0 refills | Status: DC

## 2016-05-28 MED ORDER — MORPHINE SULFATE (PF) 4 MG/ML IV SOLN
4.0000 mg | Freq: Once | INTRAVENOUS | Status: AC
  Administered 2016-05-28: 4 mg via INTRAVENOUS
  Filled 2016-05-28: qty 1

## 2016-05-28 MED ORDER — ONDANSETRON HCL 4 MG/2ML IJ SOLN
4.0000 mg | Freq: Once | INTRAMUSCULAR | Status: AC
  Administered 2016-05-28: 4 mg via INTRAVENOUS
  Filled 2016-05-28: qty 2

## 2016-05-28 MED ORDER — FAMOTIDINE 20 MG PO TABS: 20.0000 mg | ORAL_TABLET | Freq: Two times a day (BID) | ORAL | 0 refills | Status: DC

## 2016-05-28 MED ORDER — SODIUM CHLORIDE 0.9 % IV BOLUS (SEPSIS)
1000.0000 mL | Freq: Once | INTRAVENOUS | Status: AC
  Administered 2016-05-28: 1000 mL via INTRAVENOUS

## 2016-05-28 NOTE — ED Triage Notes (Signed)
Pt c/o abdominal pain since yesterday and "vomiing all night"

## 2016-05-28 NOTE — ED Provider Notes (Signed)
[facility] Provider Note   CSN: [id number] Arrival date & time: 05/28/16  1123  First Provider Contact:  First MD Initiated Contact with Patient 05/28/16 1509        History   Chief Complaint Chief Complaint  Patient presents with  . Abdominal Pain    HPI Rebekah Rhodes is a 20 y.o. female.  Pt started vomiting last night and has not stopped.  She also c/o lower abdominal pain and some diarrhea.  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  The pt denies any fevers/chills.    The history is provided by the patient.  Abdominal Pain   This is a new problem. Associated symptoms include diarrhea, nausea and vomiting.    Past Medical History:  Diagnosis Date  . Asthma   . HA (headache)     Patient Active Problem List   Diagnosis Date Noted  . Migraine 03/10/2014  . HA (headache)     Past Surgical History:  Procedure Laterality Date  . None    . WISDOM TOOTH EXTRACTION      OB History    Gravida Para Term Preterm AB Living   0 0 0 0 0 0   SAB TAB Ectopic Multiple Live Births   0 0 0 0         Home Medications    Prior to Admission medications   Medication Sig Start Date End Date Taking? Authorizing Provider  albuterol (PROVENTIL HFA;VENTOLIN HFA) 108 (90 BASE) MCG/ACT inhaler Inhale 2 puffs into the lungs every 4 (four) hours as needed for wheezing or shortness of breath. 02/12/15  Yes Tatyana Kirichenko, PA-C  etonogestrel (NEXPLANON) 68 MG IMPL implant 1 each by Subdermal route once.   Yes Historical Provider, MD  FLUoxetine (PROZAC) 20 MG capsule Take 20 mg by mouth daily. 04/21/16 04/21/17 Yes Historical Provider, MD  busPIRone (BUSPAR) 7.5 MG tablet Take 7.5 mg by mouth 2 (two) times daily. 04/14/16   Historical Provider, MD  famotidine (PEPCID) 20 MG tablet Take 1 tablet (20 mg total) by mouth 2 (two) times daily. 05/28/16   Jacalyn Lefevre, MD  ondansetron (ZOFRAN) 4 MG tablet Take 1 tablet (4 mg total) by mouth  every 6 (six) hours. 05/28/16   Jacalyn Lefevre, MD    Family History Family History  Problem Relation Age of Onset  . Breast cancer Mother     Social History Social History  Substance Use Topics  . Smoking status: Current Every Day Smoker  . Smokeless tobacco: Never Used  . Alcohol use No     Allergies   Amoxicillin and Penicillins   Review of Systems Review of Systems  Gastrointestinal: Positive for abdominal pain, diarrhea, nausea and vomiting.  All other systems reviewed and are negative.    Physical Exam Updated Vital Signs BP 104/62   Pulse 78   Temp 98.2 F (36.8 C) (Oral)   Resp 20   Ht 5\' 7"  (1.702 m)   Wt 135 lb (61.2 kg)   SpO2 100%   BMI 21.14 kg/m   Physical Exam  Constitutional: She is oriented to person, place, and time. She appears well-developed and well-nourished.  HENT:  Head: Normocephalic and atraumatic.  Right Ear: External ear normal.  Left Ear: External ear normal.  Nose: Nose normal.  Mouth/Throat: Mucous membranes are dry.  Eyes: Conjunctivae and EOM are normal. Pupils are equal, round, and reactive to light.  Neck: Normal range  of motion. Neck supple.  Cardiovascular: Normal rate, regular rhythm, normal heart sounds and intact distal pulses.   Pulmonary/Chest: Effort normal and breath sounds normal.  Abdominal: Soft. Bowel sounds are normal. There is generalized tenderness.  Genitourinary: Vagina normal and uterus normal. Right adnexum displays no tenderness. Left adnexum displays no tenderness.  Musculoskeletal: Normal range of motion.  Neurological: She is alert and oriented to person, place, and time.  Skin: Skin is warm and dry.  Psychiatric: She has a normal mood and affect. Her behavior is normal. Judgment and thought content normal.  Nursing note and vitals reviewed.    ED Treatments / Results  Labs (all labs ordered are listed, but only abnormal results are displayed) Labs Reviewed  COMPREHENSIVE METABOLIC PANEL -  Abnormal; Notable for the following:       Result Value   ALT 12 (*)    All other components within normal limits  WET PREP, GENITAL  LIPASE, BLOOD  CBC  URINALYSIS, ROUTINE W REFLEX MICROSCOPIC (NOT AT [facility])  POC URINE PREG, ED  GC/CHLAMYDIA PROBE AMP ([facility]) NOT AT [facility]    EKG  EKG Interpretation None       Radiology No results found.  Procedures Procedures (including critical care time)  Medications Ordered in ED Medications  sodium chloride 0.9 % bolus 1,000 mL (0 mLs Intravenous Stopped 05/28/16 1645)  morphine 4 MG/ML injection 4 mg (4 mg Intravenous Given 05/28/16 1534)  ondansetron (ZOFRAN) injection 4 mg (4 mg Intravenous Given 05/28/16 1534)     Initial Impression / Assessment and Plan / ED Course  I have reviewed the triage vital signs and the nursing notes.  Pertinent labs & imaging results that were available during my care of the patient were reviewed by me and considered in my medical decision making (see chart for details).  Clinical Course  Pt is feeling much better.  She is able to tolerate po fluids.  She knows to return if worse.  Final Clinical Impressions(s) / ED Diagnoses   Final diagnoses:  Nausea vomiting and diarrhea    New Prescriptions New Prescriptions   FAMOTIDINE (PEPCID) 20 MG TABLET    Take 1 tablet (20 mg total) by mouth 2 (two) times daily.   ONDANSETRON (ZOFRAN) 4 MG TABLET    Take 1 tablet (4 mg total) by mouth every 6 (six) hours.     Jacalyn Lefevre, MD 05/28/16 1700

## 2016-05-30 LAB — GC/CHLAMYDIA PROBE AMP (~~LOC~~) NOT AT ARMC
Chlamydia: NEGATIVE
NEISSERIA GONORRHEA: NEGATIVE

## 2016-06-09 ENCOUNTER — Ambulatory Visit (HOSPITAL_COMMUNITY): Payer: BLUE CROSS/BLUE SHIELD | Admitting: Psychiatry

## 2016-07-29 ENCOUNTER — Ambulatory Visit (HOSPITAL_COMMUNITY): Payer: BLUE CROSS/BLUE SHIELD | Admitting: Psychiatry

## 2016-11-19 ENCOUNTER — Ambulatory Visit (HOSPITAL_COMMUNITY)
Admission: EM | Admit: 2016-11-19 | Discharge: 2016-11-19 | Disposition: A | Discharge status: Home / Self Care | Payer: BLUE CROSS/BLUE SHIELD | Attending: Family Medicine | Admitting: Family Medicine

## 2016-11-19 ENCOUNTER — Encounter (HOSPITAL_COMMUNITY): Payer: Self-pay | Admitting: Emergency Medicine

## 2016-11-19 DIAGNOSIS — J069 Acute upper respiratory infection, unspecified: Secondary | ICD-10-CM | POA: Diagnosis not present

## 2016-11-19 DIAGNOSIS — J029 Acute pharyngitis, unspecified: Secondary | ICD-10-CM | POA: Insufficient documentation

## 2016-11-19 DIAGNOSIS — Z880 Allergy status to penicillin: Secondary | ICD-10-CM | POA: Diagnosis not present

## 2016-11-19 DIAGNOSIS — F17200 Nicotine dependence, unspecified, uncomplicated: Secondary | ICD-10-CM | POA: Insufficient documentation

## 2016-11-19 DIAGNOSIS — B9789 Other viral agents as the cause of diseases classified elsewhere: Secondary | ICD-10-CM

## 2016-11-19 LAB — POCT RAPID STREP A: STREPTOCOCCUS, GROUP A SCREEN (DIRECT): NEGATIVE

## 2016-11-19 MED ORDER — HYDROCODONE-HOMATROPINE 5-1.5 MG/5ML PO SYRP: 5.0000 mL | ORAL_SOLUTION | Freq: Four times a day (QID) | ORAL | 0 refills | Status: DC | PRN

## 2016-11-19 MED ORDER — OSELTAMIVIR PHOSPHATE 75 MG PO CAPS: 75.0000 mg | ORAL_CAPSULE | Freq: Two times a day (BID) | ORAL | 0 refills | Status: DC

## 2016-11-19 NOTE — ED Provider Notes (Signed)
[facility]    CSN: [id number] Arrival date & time: 11/19/16  1907     History   Chief Complaint Chief Complaint  Patient presents with  . URI    HPI Rebekah Rhodes is a 21 y.o. female.   This a 20 year old woman who comes in because of upper respiratory symptoms: Cough, sore throat, low-grade temperature, runny nose. She began having symptoms 2 days ago and then today her symptoms dramatically worsened.      Past Medical History:  Diagnosis Date  . Asthma   . HA (headache)     Patient Active Problem List   Diagnosis Date Noted  . Migraine 03/10/2014  . HA (headache)     Past Surgical History:  Procedure Laterality Date  . None    . WISDOM TOOTH EXTRACTION      OB History    Gravida Para Term Preterm AB Living   0 0 0 0 0 0   SAB TAB Ectopic Multiple Live Births   0 0 0 0         Home Medications    Prior to Admission medications   Medication Sig Start Date End Date Taking? Authorizing Provider  busPIRone (BUSPAR) 7.5 MG tablet Take 7.5 mg by mouth 2 (two) times daily. 04/14/16   Historical Provider, MD  etonogestrel (NEXPLANON) 68 MG IMPL implant 1 each by Subdermal route once.    Historical Provider, MD  FLUoxetine (PROZAC) 20 MG capsule Take 20 mg by mouth daily. 04/21/16 04/21/17  Historical Provider, MD  HYDROcodone-homatropine (HYDROMET) 5-1.5 MG/5ML syrup Take 5 mLs by mouth every 6 (six) hours as needed for cough. 11/19/16   Elvina Sidle, MD  oseltamivir (TAMIFLU) 75 MG capsule Take 1 capsule (75 mg total) by mouth every 12 (twelve) hours. 11/19/16   Elvina Sidle, MD    Family History Family History  Problem Relation Age of Onset  . Breast cancer Mother     Social History Social History  Substance Use Topics  . Smoking status: Current Every Day Smoker  . Smokeless tobacco: Never Used  . Alcohol use No     Allergies   Amoxicillin and Penicillins   Review of Systems Review of Systems  Constitutional: Positive for  fatigue and fever.  HENT: Positive for congestion, sinus pressure and sore throat.   Respiratory: Positive for cough.   Cardiovascular: Negative.   Gastrointestinal: Negative.   Genitourinary: Negative.   Musculoskeletal: Negative.   Neurological: Negative.      Physical Exam Triage Vital Signs ED Triage Vitals  Enc Vitals Group     BP 11/19/16 2029 127/87     Pulse Rate 11/19/16 2029 99     Resp 11/19/16 2029 16     Temp 11/19/16 2029 98.9 F (37.2 C)     Temp Source 11/19/16 2029 Oral     SpO2 11/19/16 2029 100 %     Weight --      Height --      Head Circumference --      Peak Flow --      Pain Score 11/19/16 2028 10     Pain Loc --      Pain Edu? --      Excl. in GC? --    No data found.   Updated Vital Signs BP 127/87 (BP Location: Left Arm)   Pulse 99   Temp 98.9 F (37.2 C) (Oral)   Resp 16   SpO2 100%   Physical Exam  Constitutional: She is oriented to person, place, and time. She appears well-developed and well-nourished.  HENT:  Right Ear: External ear normal.  Left Ear: External ear normal.  Mouth/Throat: Oropharynx is clear and moist.  Mild erythema in the posterior pharynx  Eyes: Conjunctivae and EOM are normal. Pupils are equal, round, and reactive to light.  Neck: Normal range of motion. Neck supple.  Cardiovascular: Normal rate, regular rhythm and normal heart sounds.   Pulmonary/Chest: Effort normal and breath sounds normal.  Musculoskeletal: Normal range of motion.  Neurological: She is alert and oriented to person, place, and time.  Skin: Skin is warm and dry.  Nursing note and vitals reviewed.    UC Treatments / Results  Labs (all labs ordered are listed, but only abnormal results are displayed) Labs Reviewed  POCT RAPID STREP A    EKG  EKG Interpretation None       Radiology No results found.  Procedures Procedures (including critical care time)  Medications Ordered in UC Medications - No data to  display   Initial Impression / Assessment and Plan / UC Course  I have reviewed the triage vital signs and the nursing notes.  Pertinent labs & imaging results that were available during my care of the patient were reviewed by me and considered in my medical decision making (see chart for details).     Final Clinical Impressions(s) / UC Diagnoses   Final diagnoses:  Viral URI with cough    New Prescriptions New Prescriptions   HYDROCODONE-HOMATROPINE (HYDROMET) 5-1.5 MG/5ML SYRUP    Take 5 mLs by mouth every 6 (six) hours as needed for cough.   OSELTAMIVIR (TAMIFLU) 75 MG CAPSULE    Take 1 capsule (75 mg total) by mouth every 12 (twelve) hours.     Elvina SidleKurt Maikol Grassia, MD 11/19/16 2145

## 2016-11-19 NOTE — ED Triage Notes (Signed)
Glenford PeersUri s/s including facial pain.  Reports feeling bad in general

## 2016-11-21 LAB — CULTURE, GROUP A STREP (THRC)

## 2017-01-12 ENCOUNTER — Inpatient Hospital Stay (HOSPITAL_COMMUNITY)
Admission: AD | Admit: 2017-01-12 | Discharge: 2017-01-12 | Disposition: A | Discharge status: Home / Self Care | Payer: BLUE CROSS/BLUE SHIELD | Source: Ambulatory Visit | Attending: Obstetrics and Gynecology | Admitting: Obstetrics and Gynecology

## 2017-01-12 ENCOUNTER — Encounter (HOSPITAL_COMMUNITY): Payer: Self-pay | Admitting: *Deleted

## 2017-01-12 DIAGNOSIS — Z3202 Encounter for pregnancy test, result negative: Secondary | ICD-10-CM | POA: Insufficient documentation

## 2017-01-12 DIAGNOSIS — N760 Acute vaginitis: Secondary | ICD-10-CM | POA: Insufficient documentation

## 2017-01-12 DIAGNOSIS — Z113 Encounter for screening for infections with a predominantly sexual mode of transmission: Secondary | ICD-10-CM

## 2017-01-12 DIAGNOSIS — B9689 Other specified bacterial agents as the cause of diseases classified elsewhere: Secondary | ICD-10-CM

## 2017-01-12 DIAGNOSIS — Z880 Allergy status to penicillin: Secondary | ICD-10-CM | POA: Insufficient documentation

## 2017-01-12 HISTORY — DX: Herpesviral infection of urogenital system, unspecified: A60.00

## 2017-01-12 HISTORY — DX: Unspecified infectious disease: B99.9

## 2017-01-12 HISTORY — DX: Anxiety disorder, unspecified: F41.9

## 2017-01-12 HISTORY — DX: Depression, unspecified: F32.A

## 2017-01-12 HISTORY — DX: Major depressive disorder, single episode, unspecified: F32.9

## 2017-01-12 HISTORY — DX: Dermatitis, unspecified: L30.9

## 2017-01-12 HISTORY — DX: Chlamydial infection, unspecified: A74.9

## 2017-01-12 LAB — URINALYSIS, ROUTINE W REFLEX MICROSCOPIC
BILIRUBIN URINE: NEGATIVE
GLUCOSE, UA: NEGATIVE mg/dL
Hgb urine dipstick: NEGATIVE
KETONES UR: NEGATIVE mg/dL
Leukocytes, UA: NEGATIVE
NITRITE: NEGATIVE
PH: 8 (ref 5.0–8.0)
Protein, ur: NEGATIVE mg/dL
Specific Gravity, Urine: 1.025 (ref 1.005–1.030)

## 2017-01-12 LAB — WET PREP, GENITAL
SPERM: NONE SEEN
Trich, Wet Prep: NONE SEEN
Yeast Wet Prep HPF POC: NONE SEEN

## 2017-01-12 LAB — POCT PREGNANCY, URINE: Preg Test, Ur: NEGATIVE

## 2017-01-12 MED ORDER — METRONIDAZOLE 500 MG PO TABS: 500.0000 mg | ORAL_TABLET | Freq: Two times a day (BID) | ORAL | 0 refills | Status: DC

## 2017-01-12 NOTE — MAU Provider Note (Signed)
History     CSN: [id number]  Arrival date and time: 01/12/17 1130  First Provider Initiated Contact with Patient 01/12/17 1448        Chief Complaint  Patient presents with  . Vaginal Discharge   HPI  Rebekah Rhodes is a 21 y.o. G0P0000 female who presents for vaginal discharge. Symptoms began 1 month ago. Reports vaginal discharge that alternates from red spotting to brown discharge. Mild odor noted & more recently has had vaginal irritation. Also complains of vaginal pain "on the inside" x 2 days. Has 1 female partner x 1+ year, does not use condoms; unsure if STD exposure. Has history of chlamydia 2 years ago with a different partner. Denies abdominal pain, fever/chills, dysuria, dyspareunia, postcoital bleeding. Had nexplanon replaced in January.   Past Medical History:  Diagnosis Date  . Anxiety   . Asthma   . Chlamydia   . Depression    was rx meds, has not picked them up  . Eczema   . Genital herpes   . HA (headache)   . Infection    UTI    Past Surgical History:  Procedure Laterality Date  . NO PAST SURGERIES    . None    . WISDOM TOOTH EXTRACTION      Family History  Problem Relation Age of Onset  . Breast cancer Mother   . Cancer Maternal Grandmother     breast; also great grandma    Social History  Substance Use Topics  . Smoking status: Never Smoker  . Smokeless tobacco: Never Used  . Alcohol use No    Allergies:  Allergies  Allergen Reactions  . Amoxicillin Hives, Diarrhea and Nausea And Vomiting    Childhood allergy  . Penicillins Hives, Diarrhea and Nausea And Vomiting    Has patient had a PCN reaction causing immediate rash, facial/tongue/throat swelling, SOB or lightheadedness with hypotension: Yes Has patient had a PCN reaction causing severe rash involving mucus membranes or skin necrosis: No Has patient had a PCN reaction that required hospitalization No Has patient had a PCN reaction occurring within the last 10 years: No If all of the  above answers are "NO", then may proceed with Cephalosporin    Prescriptions Prior to Admission  Medication Sig Dispense Refill Last Dose  . etonogestrel (NEXPLANON) 68 MG IMPL implant 1 each by Subdermal route once.   Continuous  . HYDROcodone-homatropine (HYDROMET) 5-1.5 MG/5ML syrup Take 5 mLs by mouth every 6 (six) hours as needed for cough. (Patient not taking: Reported on 01/12/2017) 120 mL 0 Not Taking at Unknown time  . oseltamivir (TAMIFLU) 75 MG capsule Take 1 capsule (75 mg total) by mouth every 12 (twelve) hours. (Patient not taking: Reported on 01/12/2017) 10 capsule 0 Not Taking at Unknown time    Review of Systems  Constitutional: Negative.   Gastrointestinal: Negative.   Genitourinary: Positive for vaginal bleeding, vaginal discharge and vaginal pain. Negative for dyspareunia, dysuria and pelvic pain.   Physical Exam   Blood pressure 124/81, pulse 83, temperature 99.2 F (37.3 C), resp. rate 16, height 5\' 7"  (1.702 m), weight 130 lb 1.9 oz (59 kg).  Physical Exam  Nursing note and vitals reviewed. Constitutional: She is oriented to person, place, and time. She appears well-developed and well-nourished. No distress.  HENT:  Head: Normocephalic and atraumatic.  Eyes: Conjunctivae are normal. Right eye exhibits no discharge. Left eye exhibits no discharge. No scleral icterus.  Neck: Normal range of motion.  Respiratory: Effort normal.  No respiratory distress.  GI: Soft. She exhibits no distension. There is no tenderness. There is no rebound and no guarding.  Genitourinary: Uterus normal. Cervix exhibits no motion tenderness, no discharge and no friability. Right adnexum displays no mass and no tenderness. Left adnexum displays no mass and no tenderness. No bleeding in the vagina. Vaginal discharge (small amount of thin tan discharge) found.  Neurological: She is alert and oriented to person, place, and time.  Skin: Skin is warm and dry. She is not diaphoretic.  Psychiatric:  She has a normal mood and affect. Her behavior is normal. Judgment and thought content normal.    MAU Course  Procedures Results for orders placed or performed during the hospital encounter of 01/12/17 (from the past 24 hour(s))  Urinalysis, Routine w reflex microscopic     Status: Abnormal   Collection Time: 01/12/17 12:10 PM  Result Value Ref Range   Color, Urine YELLOW YELLOW   APPearance HAZY (A) CLEAR   Specific Gravity, Urine 1.025 1.005 - 1.030   pH 8.0 5.0 - 8.0   Glucose, UA NEGATIVE NEGATIVE mg/dL   Hgb urine dipstick NEGATIVE NEGATIVE   Bilirubin Urine NEGATIVE NEGATIVE   Ketones, ur NEGATIVE NEGATIVE mg/dL   Protein, ur NEGATIVE NEGATIVE mg/dL   Nitrite NEGATIVE NEGATIVE   Leukocytes, UA NEGATIVE NEGATIVE  Pregnancy, urine POC     Status: None   Collection Time: 01/12/17  1:00 PM  Result Value Ref Range   Preg Test, Ur NEGATIVE NEGATIVE  Wet prep, genital     Status: Abnormal   Collection Time: 01/12/17  3:25 PM  Result Value Ref Range   Yeast Wet Prep HPF POC NONE SEEN NONE SEEN   Trich, Wet Prep NONE SEEN NONE SEEN   Clue Cells Wet Prep HPF POC PRESENT (A) NONE SEEN   WBC, Wet Prep HPF POC FEW (A) NONE SEEN   Sperm NONE SEEN     MDM UPT negative GC/CT & wet prep -- pt declines blood work  Assessment and Plan  A:  1. BV (bacterial vaginosis)   2. Screen for STD (sexually transmitted disease)    P: Discharge home Rx flagyl Discussed reasons to return to MAU Keep follow up appointment with OB/PCP  gc/ct pending   Judeth Hornrin Anthem Frazer 01/12/2017, 2:48 PM

## 2017-01-12 NOTE — MAU Note (Signed)
Pt c/o having brownish red discharge  X 1 month. Having some abd pain now x 3 days.

## 2017-01-12 NOTE — MAU Note (Signed)
Reddish brown d/c for about a  Month.  [facility], has had bleeding/periods off and on, then this started. Now vagina is irritated, having itching

## 2017-01-12 NOTE — Discharge Instructions (Signed)
Bacterial Vaginosis Bacterial vaginosis is a vaginal infection that occurs when the normal balance of bacteria in the vagina is disrupted. It results from an overgrowth of certain bacteria. This is the most common vaginal infection among women ages 15-44. Because bacterial vaginosis increases your risk for STIs (sexually transmitted infections), getting treated can help reduce your risk for chlamydia, gonorrhea, herpes, and HIV (human immunodeficiency virus). Treatment is also important for preventing complications in pregnant women, because this condition can cause an early (premature) delivery. What are the causes? This condition is caused by an increase in harmful bacteria that are normally present in small amounts in the vagina. However, the reason that the condition develops is not fully understood. What increases the risk? The following factors may make you more likely to develop this condition:  Having a new sexual partner or multiple sexual partners.  Having unprotected sex.  Douching.  Having an intrauterine device (IUD).  Smoking.  Drug and alcohol abuse.  Taking certain antibiotic medicines.  Being pregnant.  You cannot get bacterial vaginosis from toilet seats, bedding, swimming pools, or contact with objects around you. What are the signs or symptoms? Symptoms of this condition include:  Grey or [place] vaginal discharge. The discharge can also be watery or foamy.  A fish-like odor with discharge, especially after sexual intercourse or during menstruation.  Itching in and around the vagina.  Burning or pain with urination.  Some women with bacterial vaginosis have no signs or symptoms. How is this diagnosed? This condition is diagnosed based on:  Your medical history.  A physical exam of the vagina.  Testing a sample of vaginal fluid under a microscope to look for a large amount of bad bacteria or abnormal cells. Your health care provider may use a cotton swab  or a small wooden spatula to collect the sample.  How is this treated? This condition is treated with antibiotics. These may be given as a pill, a vaginal cream, or a medicine that is put into the vagina (suppository). If the condition comes back after treatment, a second round of antibiotics may be needed. Follow these instructions at home: Medicines  Take over-the-counter and prescription medicines only as told by your health care provider.  Take or use your antibiotic as told by your health care provider. Do not stop taking or using the antibiotic even if you start to feel better. General instructions  If you have a female sexual partner, tell her that you have a vaginal infection. She should see her health care provider and be treated if she has symptoms. If you have a female sexual partner, he does not need treatment.  During treatment: ? Avoid sexual activity until you finish treatment. ? Do not douche. ? Avoid alcohol as directed by your health care provider. ? Avoid breastfeeding as directed by your health care provider.  Drink enough water and fluids to keep your urine clear or pale yellow.  Keep the area around your vagina and rectum clean. ? Wash the area daily with warm water. ? Wipe yourself from front to back after using the toilet.  Keep all follow-up visits as told by your health care provider. This is important. How is this prevented?  Do not douche.  Wash the outside of your vagina with warm water only.  Use protection when having sex. This includes latex condoms and dental dams.  Limit how many sexual partners you have. To help prevent bacterial vaginosis, it is best to have sex with just   one partner (monogamous).  Make sure you and your sexual partner are tested for STIs.  Wear cotton or cotton-lined underwear.  Avoid wearing tight pants and pantyhose, especially during summer.  Limit the amount of alcohol that you drink.  Do not use any products that  contain nicotine or tobacco, such as cigarettes and e-cigarettes. If you need help quitting, ask your health care provider.  Do not use illegal drugs. Where to find more information:  Centers for Disease Control and Prevention: www.cdc.gov/std  American Sexual Health Association (ASHA): www.ashastd.org  U.S. Department of Health and Human Services, Office on Women's Health: www.womenshealth.gov/ or https://www.womenshealth.gov/a-z-topics/bacterial-vaginosis Contact a health care provider if:  Your symptoms do not improve, even after treatment.  You have more discharge or pain when urinating.  You have a fever.  You have pain in your abdomen.  You have pain during sex.  You have vaginal bleeding between periods. Summary  Bacterial vaginosis is a vaginal infection that occurs when the normal balance of bacteria in the vagina is disrupted.  Because bacterial vaginosis increases your risk for STIs (sexually transmitted infections), getting treated can help reduce your risk for chlamydia, gonorrhea, herpes, and HIV (human immunodeficiency virus). Treatment is also important for preventing complications in pregnant women, because the condition can cause an early (premature) delivery.  This condition is treated with antibiotic medicines. These may be given as a pill, a vaginal cream, or a medicine that is put into the vagina (suppository). This information is not intended to replace advice given to you by your health care provider. Make sure you discuss any questions you have with your health care provider. Document Released: 10/17/2005 Document Revised: 07/02/2016 Document Reviewed: 07/02/2016 Elsevier Interactive Patient Education  2017 [employer]  

## 2017-01-14 LAB — GC/CHLAMYDIA PROBE AMP (~~LOC~~) NOT AT ARMC
CHLAMYDIA, DNA PROBE: NEGATIVE
NEISSERIA GONORRHEA: NEGATIVE

## 2017-06-26 ENCOUNTER — Emergency Department (HOSPITAL_COMMUNITY)
Admission: EM | Admit: 2017-06-26 | Discharge: 2017-06-26 | Disposition: A | Discharge status: Home / Self Care | Payer: Self-pay | Attending: Emergency Medicine | Admitting: Emergency Medicine

## 2017-06-26 ENCOUNTER — Encounter (HOSPITAL_COMMUNITY): Payer: Self-pay | Admitting: *Deleted

## 2017-06-26 DIAGNOSIS — J45909 Unspecified asthma, uncomplicated: Secondary | ICD-10-CM | POA: Insufficient documentation

## 2017-06-26 DIAGNOSIS — R197 Diarrhea, unspecified: Secondary | ICD-10-CM

## 2017-06-26 DIAGNOSIS — Z79899 Other long term (current) drug therapy: Secondary | ICD-10-CM | POA: Insufficient documentation

## 2017-06-26 DIAGNOSIS — K2900 Acute gastritis without bleeding: Secondary | ICD-10-CM | POA: Insufficient documentation

## 2017-06-26 DIAGNOSIS — R112 Nausea with vomiting, unspecified: Secondary | ICD-10-CM

## 2017-06-26 LAB — CBC
HCT: 37.1 % (ref 36.0–46.0)
HEMOGLOBIN: 12.4 g/dL (ref 12.0–15.0)
MCH: 27.1 pg (ref 26.0–34.0)
MCHC: 33.4 g/dL (ref 30.0–36.0)
MCV: 81.2 fL (ref 78.0–100.0)
Platelets: 206 10*3/uL (ref 150–400)
RBC: 4.57 MIL/uL (ref 3.87–5.11)
RDW: 13.3 % (ref 11.5–15.5)
WBC: 4.4 10*3/uL (ref 4.0–10.5)

## 2017-06-26 LAB — URINALYSIS, ROUTINE W REFLEX MICROSCOPIC
Bilirubin Urine: NEGATIVE
Glucose, UA: NEGATIVE mg/dL
Hgb urine dipstick: NEGATIVE
Ketones, ur: NEGATIVE mg/dL
NITRITE: NEGATIVE
Protein, ur: NEGATIVE mg/dL
pH: 6 (ref 5.0–8.0)

## 2017-06-26 LAB — COMPREHENSIVE METABOLIC PANEL
ALBUMIN: 4.2 g/dL (ref 3.5–5.0)
ALT: 11 U/L — ABNORMAL LOW (ref 14–54)
AST: 20 U/L (ref 15–41)
Alkaline Phosphatase: 54 U/L (ref 38–126)
Anion gap: 5 (ref 5–15)
BUN: 9 mg/dL (ref 6–20)
CHLORIDE: 107 mmol/L (ref 101–111)
CO2: 26 mmol/L (ref 22–32)
Calcium: 9.1 mg/dL (ref 8.9–10.3)
Creatinine, Ser: 0.73 mg/dL (ref 0.44–1.00)
GFR calc Af Amer: >60 mL/min (ref >60)
GFR calc non Af Amer: >60 mL/min (ref >60)
GLUCOSE: 92 mg/dL (ref 65–99)
Potassium: 3.5 mmol/L (ref 3.5–5.1)
Sodium: 138 mmol/L (ref 135–145)
TOTAL PROTEIN: 7.2 g/dL (ref 6.5–8.1)
Total Bilirubin: 1.3 mg/dL — ABNORMAL HIGH (ref 0.3–1.2)

## 2017-06-26 LAB — URINALYSIS, MICROSCOPIC (REFLEX): RBC / HPF: NONE SEEN RBC/hpf (ref 0–5)

## 2017-06-26 LAB — LIPASE, BLOOD: LIPASE: 23 U/L (ref 11–51)

## 2017-06-26 LAB — POC URINE PREG, ED: PREG TEST UR: NEGATIVE

## 2017-06-26 MED ORDER — METOCLOPRAMIDE HCL 5 MG/ML IJ SOLN
10.0000 mg | Freq: Once | INTRAMUSCULAR | Status: AC
  Administered 2017-06-26: 10 mg via INTRAVENOUS
  Filled 2017-06-26: qty 2

## 2017-06-26 MED ORDER — ONDANSETRON 4 MG PO TBDP: 4.0000 mg | ORAL_TABLET | Freq: Three times a day (TID) | ORAL | 0 refills | Status: DC | PRN

## 2017-06-26 MED ORDER — PANTOPRAZOLE SODIUM 20 MG PO TBEC: 20.0000 mg | DELAYED_RELEASE_TABLET | Freq: Every day | ORAL | 0 refills | Status: DC

## 2017-06-26 MED ORDER — KETOROLAC TROMETHAMINE 30 MG/ML IJ SOLN
30.0000 mg | Freq: Once | INTRAMUSCULAR | Status: AC
  Administered 2017-06-26: 30 mg via INTRAVENOUS
  Filled 2017-06-26: qty 1

## 2017-06-26 MED ORDER — PANTOPRAZOLE SODIUM 20 MG PO TBEC
20.0000 mg | DELAYED_RELEASE_TABLET | Freq: Once | ORAL | Status: AC
  Administered 2017-06-26: 20 mg via ORAL
  Filled 2017-06-26: qty 1

## 2017-06-26 MED ORDER — SODIUM CHLORIDE 0.9 % IV BOLUS (SEPSIS)
1000.0000 mL | Freq: Once | INTRAVENOUS | Status: AC
  Administered 2017-06-26: 1000 mL via INTRAVENOUS

## 2017-06-26 NOTE — ED Triage Notes (Signed)
Pt complains of abdominal pain and vomiting since having a lot to drink 2 nights ago. Pt states the abdominal pain started after the vomiting. Pt states she has been unable to keep anything. Pt states she also had diarrhea yesterday. Pt also complains of headache.

## 2017-06-26 NOTE — Discharge Instructions (Signed)
Please read and follow all provided instructions.  Your diagnoses today include:  1. Acute gastritis, presence of bleeding unspecified, unspecified gastritis type   2. Nausea vomiting and diarrhea     Tests performed today include: Blood counts and electrolytes Blood tests to check liver and kidney function Blood tests to check pancreas function Urine test to look for infection and pregnancy (in women) Vital signs. See below for your results today.   Medications prescribed:   Take any prescribed medications only as directed.  Home care instructions:  Follow any educational materials contained in this packet.  Follow-up instructions: Please follow-up with your primary care provider in the next 2 days for further evaluation of your symptoms.    Return instructions:  SEEK IMMEDIATE MEDICAL ATTENTION IF: The pain does not go away or becomes severe  A temperature above 101F develops  Repeated vomiting occurs (multiple episodes)  The pain becomes localized to portions of the abdomen. The right side could possibly be appendicitis. In an adult, the left lower portion of the abdomen could be colitis or diverticulitis.  Blood is being passed in stools or vomit (bright red or black tarry stools)  You develop chest pain, difficulty breathing, dizziness or fainting, or become confused, poorly responsive, or inconsolable (young children) If you have any other emergent concerns regarding your health  Additional Information: Abdominal (belly) pain can be caused by many things. Your caregiver performed an examination and possibly ordered blood/urine tests and imaging (CT scan, x-rays, ultrasound). Many cases can be observed and treated at home after initial evaluation in the emergency department. Even though you are being discharged home, abdominal pain can be unpredictable. Therefore, you need a repeated exam if your pain does not resolve, returns, or worsens. Most patients with abdominal pain  don't have to be admitted to the hospital or have surgery, but serious problems like appendicitis and gallbladder attacks can start out as nonspecific pain. Many abdominal conditions cannot be diagnosed in one visit, so follow-up evaluations are very important.  Your vital signs today were: BP 129/84 (BP Location: Right Arm)    Pulse 96    Temp 98.2 F (36.8 C) (Oral)    Resp 16    SpO2 99%  If your blood pressure (bp) was elevated above 135/85 this visit, please have this repeated by your doctor within one month. --------------

## 2017-06-26 NOTE — ED Provider Notes (Signed)
[facility]-EMERGENCY DEPT Provider Note   CSN: [id number] Arrival date & time: 06/26/17  1301     History   Chief Complaint Chief Complaint  Patient presents with  . Nausea  . Emesis  . Abdominal Pain    HPI Rebekah Rhodes is a 21 y.o. female.  HPI  21 y.o. female, presents to the Emergency Department today due to N/V. Notes epigastric abdominal pain. States she drank "a lot" x 2 nights go and has been feeling bad ever since. States that this is the worse hangover ever. Pt states she is unable to keep anything down. Noted diarrhea yesterday that resolved. Last emesis this morning. No CP/SOB. No fevers. No cough.congestion. No numbness/tingling. No dysuria. No vaginal bleeding/discharge. Rates pain currently 3/10. Cramping in epigastric region. No other symptoms noted.    Past Medical History:  Diagnosis Date  . Anxiety   . Asthma   . Chlamydia   . Depression    was rx meds, has not picked them up  . Eczema   . Genital herpes   . HA (headache)   . Infection    UTI    Patient Active Problem List   Diagnosis Date Noted  . Migraine 03/10/2014  . HA (headache)     Past Surgical History:  Procedure Laterality Date  . NO PAST SURGERIES    . None    . WISDOM TOOTH EXTRACTION      OB History    Gravida Para Term Preterm AB Living   0 0 0 0 0 0   SAB TAB Ectopic Multiple Live Births   0 0 0 0         Home Medications    Prior to Admission medications   Medication Sig Start Date End Date Taking? Authorizing Provider  etonogestrel (NEXPLANON) 68 MG IMPL implant 1 each by Subdermal route once.    [provider]  metroNIDAZOLE (FLAGYL) 500 MG tablet Take 1 tablet (500 mg total) by mouth 2 (two) times daily. 01/12/17   Judeth Horn, NP    Family History Family History  Problem Relation Age of Onset  . Breast cancer Mother   . Cancer Maternal Grandmother        breast; also great grandma    Social History Social History  Substance Use Topics  .  Smoking status: Never Smoker  . Smokeless tobacco: Never Used  . Alcohol use No     Allergies   Amoxicillin and Penicillins   Review of Systems Review of Systems ROS reviewed and all are negative for acute change except as noted in the HPI.  Physical Exam Updated Vital Signs BP 129/84 (BP Location: Right Arm)   Pulse 96   Temp 98.2 F (36.8 C) (Oral)   Resp 16   SpO2 99%   Physical Exam  Constitutional: She is oriented to person, place, and time. She appears well-developed and well-nourished. No distress.  HENT:  Head: Normocephalic and atraumatic.  Right Ear: Tympanic membrane, external ear and ear canal normal.  Left Ear: Tympanic membrane, external ear and ear canal normal.  Nose: Nose normal.  Mouth/Throat: Uvula is midline, oropharynx is clear and moist and mucous membranes are normal. No trismus in the jaw. No oropharyngeal exudate, posterior oropharyngeal erythema or tonsillar abscesses.  Eyes: Pupils are equal, round, and reactive to light. EOM are normal.  Neck: Normal range of motion. Neck supple. No tracheal deviation present.  Cardiovascular: Normal rate, regular rhythm, S1 normal, S2 normal, normal heart  sounds, intact distal pulses and normal pulses.   Pulmonary/Chest: Effort normal and breath sounds normal. No respiratory distress. She has no decreased breath sounds. She has no wheezes. She has no rhonchi. She has no rales.  Abdominal: Normal appearance and bowel sounds are normal. There is tenderness in the epigastric area. There is no rigidity, no rebound, no guarding, no CVA tenderness, no tenderness at McBurney's point and negative Murphy's sign.  Musculoskeletal: Normal range of motion.  Neurological: She is alert and oriented to person, place, and time.  Skin: Skin is warm and dry.  Psychiatric: She has a normal mood and affect. Her speech is normal and behavior is normal. Thought content normal.   ED Treatments / Results  Labs (all labs ordered are  listed, but only abnormal results are displayed) Labs Reviewed  COMPREHENSIVE METABOLIC PANEL - Abnormal; Notable for the following:       Result Value   ALT 11 (*)    Total Bilirubin 1.3 (*)    All other components within normal limits  LIPASE, BLOOD  CBC  URINALYSIS, ROUTINE W REFLEX MICROSCOPIC  POC URINE PREG, ED    EKG  EKG Interpretation None       Radiology No results found.  Procedures Procedures (including critical care time)  Medications Ordered in ED Medications  sodium chloride 0.9 % bolus 1,000 mL (not administered)  metoCLOPramide (REGLAN) injection 10 mg (not administered)  ketorolac (TORADOL) 30 MG/ML injection 30 mg (not administered)  pantoprazole (PROTONIX) EC tablet 20 mg (not administered)     Initial Impression / Assessment and Plan / ED Course  I have reviewed the triage vital signs and the nursing notes.  Pertinent labs & imaging results that were available during my care of the patient were reviewed by me and considered in my medical decision making (see chart for details).  Final Clinical Impressions(s) / ED Diagnoses  {I have reviewed and evaluated the relevant laboratory values.   {I have reviewed the relevant previous healthcare records.  {I obtained HPI from historian.   ED Course:  Assessment: Patient is a 21 y.o. female presents to the Emergency Department today due to N/V. Notes epigastric abdominal pain. States she drank "a lot" x 2 nights go and has been feeling bad ever since. States that this is the worse hangover ever. Pt states she is unable to keep anything down. Noted diarrhea yesterday that resolved. Last emesis this morning. No CP/SOB. No fevers. No cough.congestion. No numbness/tingling. No dysuria. No vaginal bleeding/discharge. Rates pain currently 3/10. Cramping in epigastric region. On exam, nontoxic, nonseptic appearing, in no apparent distress. Patient's pain and other symptoms adequately managed in emergency department.   Fluid bolus given.  Labs and vitals reviewed.  Patient does not meet the SIRS or Sepsis criteria.  On repeat exam patient does not have a surgical abdomen and there are no peritoneal signs.  No indication of appendicitis, bowel obstruction, bowel perforation, cholecystitis, diverticulitis, PID or ectopic pregnancy. Likely gastritis from excessive ETOH consumption. Patient discharged home with symptomatic treatment and given strict instructions for follow-up with their primary care physician.  I have also discussed reasons to return immediately to the ER.  Patient expresses understanding and agrees with plan.  Disposition/Plan:  DC Home Additional Verbal discharge instructions given and discussed with patient.  Pt Instructed to f/u with PCP in the next week for evaluation and treatment of symptoms. Return precautions given Pt acknowledges and agrees with plan  Supervising Physician Doug Sou, MD  Final diagnoses:  Acute gastritis, presence of bleeding unspecified, unspecified gastritis type  Nausea vomiting and diarrhea    New Prescriptions New Prescriptions   No medications on file     Audry Pili, Cordelia Poche 06/26/17 1712    Doug Sou, MD 06/26/17 2215

## 2017-07-17 ENCOUNTER — Other Ambulatory Visit: Payer: Self-pay | Admitting: Physician Assistant

## 2017-07-17 DIAGNOSIS — Z1231 Encounter for screening mammogram for malignant neoplasm of breast: Secondary | ICD-10-CM

## 2017-08-09 ENCOUNTER — Emergency Department (HOSPITAL_COMMUNITY)
Admission: EM | Admit: 2017-08-09 | Discharge: 2017-08-09 | Disposition: A | Discharge status: Home / Self Care | Payer: Self-pay | Attending: Emergency Medicine | Admitting: Emergency Medicine

## 2017-08-09 ENCOUNTER — Encounter (HOSPITAL_COMMUNITY): Payer: Self-pay | Admitting: Emergency Medicine

## 2017-08-09 DIAGNOSIS — Z79899 Other long term (current) drug therapy: Secondary | ICD-10-CM | POA: Insufficient documentation

## 2017-08-09 DIAGNOSIS — J45909 Unspecified asthma, uncomplicated: Secondary | ICD-10-CM | POA: Insufficient documentation

## 2017-08-09 DIAGNOSIS — F331 Major depressive disorder, recurrent, moderate: Secondary | ICD-10-CM | POA: Insufficient documentation

## 2017-08-09 NOTE — ED Notes (Signed)
Patient reports she is here for depression but denies SI/HI.  States she was referred to ER by PCP to see if she could "talk to somebody".

## 2017-08-09 NOTE — ED Triage Notes (Signed)
Pt c/o depression. Pt states she has chronic depression since middle school. Pt had been taking Zoloft for approximately 3 weeks and didn't see a change so stopped taking meds. Pt states her last dose was 2 weeks ago. Denies SI / HI. Friend with patient.

## 2017-08-09 NOTE — ED Provider Notes (Signed)
[facility]-EMERGENCY DEPT Provider Note   CSN: [id number] Arrival date & time: 08/09/17  1131     History   Chief Complaint Chief Complaint  Patient presents with  . Depression    HPI Rebekah Rhodes is a 21 y.o. female. With a history of depression.  Present to the ED with loss of interest in activities, decreased appetite and fatigue over the last three weeks.  Was previous prescribed Zoloft discontinued medication after three weeks because "it did not work".  Acknowledges community supports and is willing to ask for help.  Denies any suicidal or homicidal ideation. Not engaged in any risky behavior.  Pt reports that the trigger for her symptoms is likely several new stressors.  HPI  Past Medical History:  Diagnosis Date  . Anxiety   . Asthma   . Chlamydia   . Depression    was rx meds, has not picked them up  . Eczema   . Genital herpes   . HA (headache)   . Infection    UTI    Patient Active Problem List   Diagnosis Date Noted  . Migraine 03/10/2014  . HA (headache)     Past Surgical History:  Procedure Laterality Date  . NO PAST SURGERIES    . None    . WISDOM TOOTH EXTRACTION      OB History    Gravida Para Term Preterm AB Living   0 0 0 0 0 0   SAB TAB Ectopic Multiple Live Births   0 0 0 0         Home Medications    Prior to Admission medications   Medication Sig Start Date End Date Taking? Authorizing Provider  Ibuprofen-Diphenhydramine Cit (IBUPROFEN PM) 200-38 MG TABS Take 1 tablet by mouth at bedtime as needed (pain).    [provider]  metroNIDAZOLE (FLAGYL) 500 MG tablet Take 1 tablet (500 mg total) by mouth 2 (two) times daily. Patient not taking: Reported on 06/26/2017 01/12/17   Judeth Horn, NP  ondansetron (ZOFRAN ODT) 4 MG disintegrating tablet Take 1 tablet (4 mg total) by mouth every 8 (eight) hours as needed for nausea or vomiting. 06/26/17   Audry Pili, PA-C  pantoprazole (PROTONIX) 20 MG tablet Take 1 tablet (20 mg total)  by mouth daily. 06/26/17   Audry Pili, PA-C    Family History Family History  Problem Relation Age of Onset  . Breast cancer Mother   . Cancer Maternal Grandmother        breast; also great grandma    Social History Social History  Substance Use Topics  . Smoking status: Never Smoker  . Smokeless tobacco: Never Used  . Alcohol use No     Allergies   Amoxicillin and Penicillins   Review of Systems Review of Systems  Constitutional: Positive for activity change.  Eyes: Negative for visual disturbance.  Neurological: Negative for weakness and headaches.  Psychiatric/Behavioral: Positive for decreased concentration and sleep disturbance. Negative for suicidal ideas.     Physical Exam Updated Vital Signs BP 113/81 (BP Location: Left Arm)   Pulse 92   Temp 98.2 F (36.8 C) (Oral)   Resp 16   SpO2 100%   Physical Exam  Constitutional: She is oriented to person, place, and time. She appears well-developed.  HENT:  Head: Normocephalic and atraumatic.  Eyes: EOM are normal.  Neck: Normal range of motion. Neck supple.  Cardiovascular: Normal rate.   Pulmonary/Chest: Effort normal.  Abdominal: Bowel sounds are  normal.  Neurological: She is alert and oriented to person, place, and time.  Skin: Skin is warm and dry.  Nursing note and vitals reviewed.    ED Treatments / Results  Labs (all labs ordered are listed, but only abnormal results are displayed) Labs Reviewed - No data to display  EKG  EKG Interpretation None       Radiology No results found.  Procedures Procedures (including critical care time)  Medications Ordered in ED Medications - No data to display   Initial Impression / Assessment and Plan / ED Course  I have reviewed the triage vital signs and the nursing notes.  Pertinent labs & imaging results that were available during my care of the patient were reviewed by me and considered in my medical decision making (see chart for  details).     Patient with history of depression not taking any medication comes in for depressed thoughts for the past several days. Patient has no SI, HI, psychosis. Patient denies any high risk behavior.   Was able to contract for safety. Patient also has sound support system at home.  Patient is deemed safe for outpatient follow-up at this time an appropriate resources have been provided. Strict ER return precautions have been discussed, and patient is agreeing with the plan and is comfortable with the workup done and the recommendations from the ER.   Final Clinical Impressions(s) / ED Diagnoses   Final diagnoses:  Moderate episode of recurrent major depressive disorder [facility])    New Prescriptions New Prescriptions   No medications on file     Derwood Kaplan, MD 08/09/17 1300

## 2017-08-09 NOTE — Discharge Instructions (Signed)
Pleas  follow-up with the psychiatric team as requested. Please refrain from substance abuse. Return to the ER if your symptoms worsen.

## 2017-11-13 ENCOUNTER — Ambulatory Visit: Payer: Self-pay | Admitting: Advanced Practice Midwife

## 2017-11-13 ENCOUNTER — Other Ambulatory Visit (HOSPITAL_COMMUNITY)
Admission: RE | Admit: 2017-11-13 | Discharge: 2017-11-13 | Disposition: A | Discharge status: Home / Self Care | Payer: Medicaid Other | Source: Ambulatory Visit | Attending: Advanced Practice Midwife | Admitting: Advanced Practice Midwife

## 2017-11-13 ENCOUNTER — Encounter: Payer: Self-pay | Admitting: General Practice

## 2017-11-13 ENCOUNTER — Encounter: Payer: Self-pay | Admitting: Advanced Practice Midwife

## 2017-11-13 VITALS — BP 128/76 | HR 92 | Wt 131.0 lb

## 2017-11-13 DIAGNOSIS — Z0000 Encounter for general adult medical examination without abnormal findings: Secondary | ICD-10-CM

## 2017-11-13 DIAGNOSIS — Z01419 Encounter for gynecological examination (general) (routine) without abnormal findings: Secondary | ICD-10-CM

## 2017-11-13 DIAGNOSIS — B009 Herpesviral infection, unspecified: Secondary | ICD-10-CM

## 2017-11-13 DIAGNOSIS — N760 Acute vaginitis: Secondary | ICD-10-CM | POA: Insufficient documentation

## 2017-11-13 DIAGNOSIS — B9689 Other specified bacterial agents as the cause of diseases classified elsewhere: Secondary | ICD-10-CM | POA: Insufficient documentation

## 2017-11-13 DIAGNOSIS — N898 Other specified noninflammatory disorders of vagina: Secondary | ICD-10-CM

## 2017-11-13 MED ORDER — ACYCLOVIR 400 MG PO TABS: 400.0000 mg | ORAL_TABLET | Freq: Three times a day (TID) | ORAL | 2 refills | Status: DC

## 2017-11-13 NOTE — Progress Notes (Signed)
Subjective:     Patient ID: Rebekah Rhodes, female   DOB: 12/08/1995, 22 y.o.   MRN: [id number]  Rebekah MarkerDeasia N Reise is a 22 y.o. G0P0000 who presents today with a lump and pain at her vagina. She states that a couple of years ago she was diagnosed with oral herpes, but they were on her genitals. She reports that she does not understand this. She states that this feels like the same thing. She also has a [employer] fast streamerdischagre with odor. Chart review shows +HSV-1 in 2016.    Vaginal Pain  The patient's primary symptoms include genital lesions, vaginal bleeding and vaginal discharge. This is a new problem. The current episode started yesterday. The problem occurs constantly. The problem has been gradually worsening. Pain severity now: 4/10. The problem affects the right side. She is not pregnant. Pertinent negatives include no chills, dysuria, fever, frequency, nausea or vomiting. The vaginal discharge was bloody. The vaginal bleeding is typical of menses. Nothing aggravates the symptoms. She has tried nothing for the symptoms. Contraceptive use: Nexplanon  Her menstrual history has been irregular. Her past medical history is significant for herpes simplex.     Review of Systems  Constitutional: Negative for chills and fever.  Gastrointestinal: Negative for nausea and vomiting.  Genitourinary: Positive for vaginal bleeding, vaginal discharge and vaginal pain. Negative for dysuria and frequency.       Objective:   Physical Exam  Constitutional: She is oriented to person, place, and time. She appears well-developed and well-nourished. No distress.  HENT:  Head: Normocephalic.  Cardiovascular: Normal rate.  Pulmonary/Chest: Effort normal.  Abdominal: Soft. There is no tenderness. There is no rebound.  Genitourinary:  Genitourinary Comments:  External: small early stage herpatic lesion on the right labia majora  Vagina: small amount of Mcvey discharge Cervix: pink, smooth, no CMT Uterus: NSSC Adnexa: NT    Neurological: She is alert and oriented to person, place, and time.  Skin: Skin is warm and dry.  Nursing note and vitals reviewed.      Assessment:     1. Well woman exam with routine gynecological exam   2. Routine health maintenance   3. HSV-1 (herpes simplex virus 1) infection        Plan:     Pap with GC/CT, yeast and BV Rx: acyclovir 400mg  TID x 5 days with 2RF sent to pharmacy DW patient that since her last outbreak was over 2 years ago, then episodic treatment would be best at this time. If outbreaks become more frequent, then we could consider daily suppression. She is agreeable to this plan.   Thressa ShellerHeather Hogan 5:47 PM 11/13/17

## 2017-11-13 NOTE — Patient Instructions (Signed)

## 2017-11-15 LAB — CYTOLOGY - PAP
Bacterial vaginitis: POSITIVE — AB
Candida vaginitis: NEGATIVE
Chlamydia: NEGATIVE
Diagnosis: NEGATIVE
Neisseria Gonorrhea: NEGATIVE
Trichomonas: NEGATIVE

## 2017-11-20 ENCOUNTER — Other Ambulatory Visit: Payer: Self-pay | Admitting: Advanced Practice Midwife

## 2017-11-20 MED ORDER — METRONIDAZOLE 500 MG PO TABS: 500.0000 mg | ORAL_TABLET | Freq: Two times a day (BID) | ORAL | 0 refills | Status: DC

## 2017-11-28 ENCOUNTER — Encounter (HOSPITAL_COMMUNITY): Payer: Self-pay | Admitting: *Deleted

## 2017-11-28 ENCOUNTER — Emergency Department (HOSPITAL_COMMUNITY)
Admission: EM | Admit: 2017-11-28 | Discharge: 2017-11-28 | Disposition: A | Discharge status: Home / Self Care | Payer: Self-pay | Attending: Emergency Medicine | Admitting: Emergency Medicine

## 2017-11-28 ENCOUNTER — Emergency Department (HOSPITAL_COMMUNITY): Payer: Self-pay

## 2017-11-28 DIAGNOSIS — J209 Acute bronchitis, unspecified: Secondary | ICD-10-CM | POA: Insufficient documentation

## 2017-11-28 DIAGNOSIS — J45909 Unspecified asthma, uncomplicated: Secondary | ICD-10-CM | POA: Insufficient documentation

## 2017-11-28 DIAGNOSIS — R079 Chest pain, unspecified: Secondary | ICD-10-CM

## 2017-11-28 DIAGNOSIS — Z79899 Other long term (current) drug therapy: Secondary | ICD-10-CM | POA: Insufficient documentation

## 2017-11-28 LAB — POC URINE PREG, ED: Preg Test, Ur: NEGATIVE

## 2017-11-28 MED ORDER — ALBUTEROL SULFATE (2.5 MG/3ML) 0.083% IN NEBU
5.0000 mg | INHALATION_SOLUTION | Freq: Once | RESPIRATORY_TRACT | Status: AC
  Administered 2017-11-28: 5 mg via RESPIRATORY_TRACT
  Filled 2017-11-28: qty 6

## 2017-11-28 MED ORDER — ALBUTEROL SULFATE HFA 108 (90 BASE) MCG/ACT IN AERS
1.0000 | INHALATION_SPRAY | Freq: Once | RESPIRATORY_TRACT | Status: AC
  Administered 2017-11-28: 1 via RESPIRATORY_TRACT
  Filled 2017-11-28: qty 6.7

## 2017-11-28 MED ORDER — IPRATROPIUM BROMIDE 0.02 % IN SOLN
0.5000 mg | Freq: Once | RESPIRATORY_TRACT | Status: AC
  Administered 2017-11-28: 0.5 mg via RESPIRATORY_TRACT
  Filled 2017-11-28: qty 2.5

## 2017-11-28 MED ORDER — ALBUTEROL SULFATE HFA 108 (90 BASE) MCG/ACT IN AERS: 2.0000 | INHALATION_SPRAY | Freq: Four times a day (QID) | RESPIRATORY_TRACT | 2 refills | Status: DC | PRN

## 2017-11-28 MED ORDER — DM-GUAIFENESIN ER 30-600 MG PO TB12: 1.0000 | ORAL_TABLET | Freq: Two times a day (BID) | ORAL | 0 refills | Status: AC

## 2017-11-28 NOTE — Discharge Instructions (Signed)
Please read and follow all provided instructions.  Your diagnoses today include:  1. Acute bronchitis, unspecified organism   2. Chest pain, unspecified type     You appear to have an upper respiratory infection (URI). An upper respiratory tract infection, or cold, is a viral infection of the air passages leading to the lungs. It should improve gradually after 5-7 days. You may have a lingering cough that lasts for 2- 4 weeks after the infection.  Tests performed today include: Vital signs. See below for your results today.   Medications prescribed:   Take any prescribed medications only as directed. Treatment for your infection is aimed at treating the symptoms. There are no medications, such as antibiotics, that will cure your infection.   Mucinex DM helps loosen the mucus in your lungs, as well as a pressure cough.   Please use your inhaler as needed  Home care instructions:  Follow any educational materials contained in this packet.   Your illness is contagious and can be spread to others, especially during the first 3 or 4 days. It cannot be cured by antibiotics or other medicines. Take basic precautions such as washing your hands often, covering your mouth when you cough or sneeze, and avoiding public places where you could spread your illness to others.   Please continue drinking plenty of fluids.  Use over-the-counter medicines as needed as directed on packaging for symptom relief.  You may also use ibuprofen or tylenol as directed on packaging for pain or fever.  Do not take multiple medicines containing Tylenol or acetaminophen to avoid taking too much of this medication.  Follow-up instructions: Please follow-up with your primary care provider in the next 3 days for further evaluation of your symptoms if you are not feeling better.   Return instructions:  Please return to the Emergency Department if you experience worsening symptoms.  RETURN IMMEDIATELY IF you develop  shortness of breath, confusion or altered mental status, a new rash, become dizzy, faint, or poorly responsive, or are unable to be cared for at home. Please return if you have persistent vomiting and cannot keep down fluids or develop a fever that is not controlled by tylenol or motrin.   Please return if you have any other emergent concerns.  Additional Information:  Your vital signs today were: BP 128/77    Pulse 85    Temp 98.1 F (36.7 C) (Oral)    Resp 18    SpO2 100%  If your blood pressure (BP) was elevated above 135/85 this visit, please have this repeated by your doctor within one month. --------------

## 2017-11-28 NOTE — ED Notes (Signed)
Called patient for assigned room with no answer.  

## 2017-11-28 NOTE — ED Triage Notes (Signed)
Pt w/ hx of asthma complains of chest pain and shortness of breath since last night. Pt states she does not have an inhaler. Pt states she has been sick with a URI lately.

## 2017-11-28 NOTE — ED Provider Notes (Signed)
[facility]-EMERGENCY DEPT Provider Note   CSN: [id number] Arrival date & time: 11/28/17  1743     History   Chief Complaint Chief Complaint  Patient presents with  . Chest Pain    hx asthma    HPI Rebekah Rhodes is a 22 y.o. female.  HPI   Patient is a 22 year old female with a history of asthma presenting for chest pain, shortness of breath, and cough.  Patient reports that she had an upper respiratory infection 1 week ago that has persisted and cough.  Patient reports that she feels a tightness in her chest, and describes it as feeling identical to all prior events of asthma exacerbation.  Patient reporting pain exacerbated by coughing.  Patient reports that the congestion is causing her to have difficulty breathing as well.  Patient denies any fevers, sore throat, facial pain.  Patient denies any pertinent risk factors for DVT/PE such as estrogen use, recent immobilization, recent hospitalization, recent surgery, history of DVT/PE, or lower extremity edema.  Patient has not had her inhaler, and will use her stepmother's inhaler when she feels short of breath.  Patient has tried NyQuil for her symptoms without full relief.  Past Medical History:  Diagnosis Date  . Anxiety   . Asthma   . Chlamydia   . Depression    was rx meds, has not picked them up  . Eczema   . Genital herpes   . HA (headache)   . Infection    UTI    Patient Active Problem List   Diagnosis Date Noted  . 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 Migraine 03/10/2014  . HA (headache)     Past Surgical History:  Procedure Laterality Date  . NO PAST SURGERIES    . None    . WISDOM TOOTH EXTRACTION      OB History    Gravida Para Term Preterm AB Living   0 0 0 0 0 0   SAB TAB Ectopic Multiple Live Births   0 0 0 0         Home Medications    Prior to Admission medications   Medication Sig Start Date End Date Taking? Authorizing Provider    Ibuprofen-Diphenhydramine Cit (IBUPROFEN PM) 200-38 MG TABS Take 1 tablet by mouth at bedtime as needed (pain).   Yes [provider]  metroNIDAZOLE (FLAGYL) 500 MG tablet Take 1 tablet (500 mg total) by mouth 2 (two) times daily. 11/20/17  Yes Thressa Sheller D, CNM  acyclovir (ZOVIRAX) 400 MG tablet Take 1 tablet (400 mg total) by mouth 3 (three) times daily. 11/13/17   Armando Reichert, CNM  albuterol (PROVENTIL HFA;VENTOLIN HFA) 108 (90 Base) MCG/ACT inhaler Inhale 2 puffs into the lungs every 6 (six) hours as needed for wheezing or shortness of breath. 11/28/17   Aviva Kluver B, PA-C  dextromethorphan-guaiFENesin (MUCINEX DM) 30-600 MG 12hr tablet Take 1 tablet by mouth 2 (two) times daily for 7 days. 11/28/17 12/05/17  Aviva Kluver B, PA-C  ondansetron (ZOFRAN ODT) 4 MG disintegrating tablet Take 1 tablet (4 mg total) by mouth every 8 (eight) hours as needed for nausea or vomiting. Patient not taking: Reported on 11/13/2017 06/26/17   Audry Pili, PA-C  pantoprazole (PROTONIX) 20 MG tablet Take 1 tablet (20 mg total) by mouth daily. Patient not taking: Reported on 11/13/2017 06/26/17   Audry Pili, PA-C    Family History Family History  Problem Relation Age of Onset  .  Breast cancer Mother   . Cancer Maternal Grandmother        breast; also great grandma    Social History Social History   Tobacco Use  . Smoking status: Never Smoker  . Smokeless tobacco: Never Used  Substance Use Topics  . Alcohol use: No  . Drug use: Yes    Frequency: 7.0 times per week    Types: Marijuana     Allergies   Amoxicillin and Penicillins   Review of Systems Review of Systems  Constitutional: Negative for chills and fever.  HENT: Positive for congestion and rhinorrhea. Negative for sore throat and trouble swallowing.   Respiratory: Positive for cough, chest tightness and shortness of breath. Negative for wheezing.   Cardiovascular: Positive for chest pain. Negative for leg swelling.   Musculoskeletal: Negative for myalgias.     Physical Exam Updated Vital Signs BP 139/79 (BP Location: Right Arm)   Pulse 72   Temp 98.1 F (36.7 C) (Oral)   Resp 18   SpO2 100%   Physical Exam  Constitutional: She appears well-developed and well-nourished. No distress.  Sitting comfortably in bed.  HENT:  Head: Normocephalic and atraumatic.  Eyes: Conjunctivae are normal. Right eye exhibits no discharge. Left eye exhibits no discharge.  EOMs normal to gross examination.  Neck: Normal range of motion.  Cardiovascular: Normal rate and regular rhythm.  No murmur heard. No lower extremity edema.  Pulmonary/Chest: She has no wheezes. She has no rales.  Normal respiratory effort. Patient converses comfortably. No audible wheeze or stridor.  Abdominal: She exhibits no distension.  Musculoskeletal: Normal range of motion.  No calf tenderness bilaterally.  Neurological: She is alert.  Cranial nerves intact to gross observation. Patient moves extremities without difficulty.  Skin: Skin is warm and dry. She is not diaphoretic.  Psychiatric: She has a normal mood and affect. Her behavior is normal. Judgment and thought content normal.  Nursing note and vitals reviewed.    ED Treatments / Results  Labs (all labs ordered are listed, but only abnormal results are displayed) Labs Reviewed  POC URINE PREG, ED    EKG  EKG Interpretation  Date/Time:  Tuesday November 28 2017 21:49:54 EST Ventricular Rate:  90 PR Interval:    QRS Duration: 85 QT Interval:  412 QTC Calculation: 505 R Axis:   78 Text Interpretation:  Sinus rhythm Abnormal Q suggests anterior infarct Nonspecific T abnormalities, diffuse leads Prolonged QT interval Interpretation limited secondary to artifact No significant change since last tracing (2 tracings) Confirmed by Vanetta Mulders [phone]) on 11/28/2017 9:56:29 PM       Radiology Dg Chest 2 View  Result Date: 11/28/2017 CLINICAL DATA:  Asthma  exacerbation with shortness of breath and chest tightness. EXAM: CHEST  2 VIEW COMPARISON:  02/12/2015, 02/26/2014, 02/19/2014. FINDINGS: Cardiomediastinal silhouette unremarkable, unchanged. Hyperinflation with mildly prominent bronchovascular markings diffusely and mild central peribronchial thickening, unchanged. Lungs otherwise clear. No localized airspace consolidation. No pleural effusions. No pneumothorax. Normal pulmonary vascularity. Visualized bony thorax intact. IMPRESSION: Mild changes of bronchitis and/or asthma without evidence of focal pneumonia. Electronically Signed   By: Hulan Saas M.D.   On: 11/28/2017 21:11    Procedures Procedures (including critical care time)  Medications Ordered in ED Medications  albuterol (PROVENTIL) (2.5 MG/3ML) 0.083% nebulizer solution 5 mg (5 mg Nebulization Given 11/28/17 2038)  ipratropium (ATROVENT) nebulizer solution 0.5 mg (0.5 mg Nebulization Given 11/28/17 2038)  albuterol (PROVENTIL HFA;VENTOLIN HFA) 108 (90 Base) MCG/ACT inhaler 1 puff (1 puff  Inhalation Given 11/28/17 2150)     Initial Impression / Assessment and Plan / ED Course  I have reviewed the triage vital signs and the nursing notes.  Pertinent labs & imaging results that were available during my care of the patient were reviewed by me and considered in my medical decision making (see chart for details).     Final Clinical Impressions(s) / ED Diagnoses   Final diagnoses:  Acute bronchitis, unspecified organism  Chest pain, unspecified type   Patient is nontoxic-appearing, afebrile, and in no acute distress.  Patient reporting that her symptoms are consistent with prior episodes of illness when she does not have access to inhalers.  Low suspicion for pulmonary embolism as the cause of patient's chest pain or shortness of breath, as patient is Wells and PERC negative.  Chest x-ray is demonstrating central bronchitic thickening suggestive of bronchitis.  EKG without evidence  of ischemia, infarction, arrhythmia, and unchanged from prior.  No changes on EKG suggestive of pericarditis, and patient has unchanged positional pain.    Patient had improvement in her symptoms after 1 DuoNeb treatment.  Patient discharged with symptom medic therapy with inhaler and Mucinex.  Patient is moving air well and without wheeze, and will defer prednisone therapy at this time.  Patient given return precautions for any increasing chest pain or shortness of breath.  Patient encouraged to establish primary care, and given resources.  Patient is understanding and agrees with the plan of care.  This is a supervised visit with Dr. Vanetta MuldersScott Zackowski. Evaluation, management, and discharge planning discussed with this attending physician.  ED Discharge Orders        Ordered    albuterol (PROVENTIL HFA;VENTOLIN HFA) 108 (90 Base) MCG/ACT inhaler  Every 6 hours PRN     11/28/17 2224    dextromethorphan-guaiFENesin (MUCINEX DM) 30-600 MG 12hr tablet  2 times daily     11/28/17 2224       Delia ChimesMurray, Kasiyah Platter B, PA-C 11/28/17 2344    Vanetta MuldersZackowski, Scott, MD 11/29/17 1531

## 2017-11-28 NOTE — ED Notes (Signed)
Patient in radiology

## 2017-12-26 ENCOUNTER — Other Ambulatory Visit: Payer: Self-pay

## 2017-12-26 ENCOUNTER — Emergency Department (HOSPITAL_COMMUNITY)
Admission: EM | Admit: 2017-12-26 | Discharge: 2017-12-26 | Disposition: A | Discharge status: Home / Self Care | Payer: Medicaid Other | Attending: Emergency Medicine | Admitting: Emergency Medicine

## 2017-12-26 ENCOUNTER — Encounter (HOSPITAL_COMMUNITY): Payer: Self-pay | Admitting: Emergency Medicine

## 2017-12-26 DIAGNOSIS — F1210 Cannabis abuse, uncomplicated: Secondary | ICD-10-CM | POA: Insufficient documentation

## 2017-12-26 DIAGNOSIS — R0981 Nasal congestion: Secondary | ICD-10-CM | POA: Insufficient documentation

## 2017-12-26 DIAGNOSIS — R0989 Other specified symptoms and signs involving the circulatory and respiratory systems: Secondary | ICD-10-CM | POA: Insufficient documentation

## 2017-12-26 DIAGNOSIS — J069 Acute upper respiratory infection, unspecified: Secondary | ICD-10-CM | POA: Insufficient documentation

## 2017-12-26 DIAGNOSIS — Z79899 Other long term (current) drug therapy: Secondary | ICD-10-CM | POA: Insufficient documentation

## 2017-12-26 DIAGNOSIS — R51 Headache: Secondary | ICD-10-CM | POA: Insufficient documentation

## 2017-12-26 DIAGNOSIS — J45909 Unspecified asthma, uncomplicated: Secondary | ICD-10-CM | POA: Insufficient documentation

## 2017-12-26 DIAGNOSIS — J0110 Acute frontal sinusitis, unspecified: Secondary | ICD-10-CM

## 2017-12-26 DIAGNOSIS — M791 Myalgia, unspecified site: Secondary | ICD-10-CM | POA: Insufficient documentation

## 2017-12-26 DIAGNOSIS — R1110 Vomiting, unspecified: Secondary | ICD-10-CM | POA: Insufficient documentation

## 2017-12-26 MED ORDER — OXYMETAZOLINE HCL 0.05 % NA SOLN
1.0000 | Freq: Once | NASAL | Status: AC
  Administered 2017-12-26: 1 via NASAL
  Filled 2017-12-26: qty 15

## 2017-12-26 NOTE — Discharge Instructions (Signed)
Use 1000 mg tylenol plus 600 mg ibuprofen for headaches. Facial and head pain likely from viral sinus congestion. Use afrin as prescribed for a maximum of two days. After switch over to flonase nasal spray. Take a daily claritin, zyrtec or any other allergy medication to help with congestion. Stay hydrated.   The main treatment approach for a viral upper respiratory infection and/or influenza is to treat the symptoms, support your immune system and prevent spread of illness. Wash your hands often to prevent spread.  A viral upper respiratory infection and/or influenza typically lasts 7-10 days.  Symptoms resolve slowly.  However, a viral upper respiratory infection can also worsen and progress into pneumonia.  Monitor your symptoms. If your symptoms worsen, persist and you develop persistent fevers, chest pain, productive cough you should follow up with your primary care provider.

## 2017-12-26 NOTE — ED Provider Notes (Signed)
[facility]   CSN: [id number] Arrival date & time: 12/26/17  0818     History   Chief Complaint Chief Complaint  Patient presents with  . Facial Pain  . Migraine    HPI Byron Arlana Hove Helmers is a 22 y.o. female w/ h/o asthma, STDs, headaches here for evaluation of bilateral facial pain, headache, nasal congestion, runny nose and body aches x 3 days. Woke up this morning and threw up once, non bloody non bilious. 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 Has tried OTC cold remedies without relief. Has had sinus infections before but pain is worse today. Denies fevers, chills, sore throat, couch, chest pain, shortness of breath, abdominal pain, dysuria, diarrhea, constipation. No longer nauseous. Has drank a full water bottle in waiting room. No h/o immunocompromise. She gets depo injection and is not concerned about pregnancy.   HPI  Past Medical History:  Diagnosis Date  . Anxiety   . Asthma   . Chlamydia   . Depression    was rx meds, has not picked them up  . Eczema   . Genital herpes   . HA (headache)   . Infection    UTI    Patient Active Problem List   Diagnosis Date Noted  . 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 Migraine 03/10/2014  . HA (headache)     Past Surgical History:  Procedure Laterality Date  . NO PAST SURGERIES    . None    . WISDOM TOOTH EXTRACTION      OB History    Gravida Para Term Preterm AB Living   0 0 0 0 0 0   SAB TAB Ectopic Multiple Live Births   0 0 0 0         Home Medications    Prior to Admission medications   Medication Sig Start Date End Date Taking? Authorizing Provider  acyclovir (ZOVIRAX) 400 MG tablet Take 1 tablet (400 mg total) by mouth 3 (three) times daily. 11/13/17   Armando ReichertHogan, Heather D, CNM  albuterol (PROVENTIL HFA;VENTOLIN HFA) 108 (90 Base) MCG/ACT inhaler Inhale 2 puffs into the lungs every 6 (six) hours as needed for wheezing  or shortness of breath. 11/28/17   Aviva KluverMurray, Alyssa B, PA-C  Ibuprofen-Diphenhydramine Cit (IBUPROFEN PM) 200-38 MG TABS Take 1 tablet by mouth at bedtime as needed (pain).    [provider]  metroNIDAZOLE (FLAGYL) 500 MG tablet Take 1 tablet (500 mg total) by mouth 2 (two) times daily. 11/20/17   Thressa ShellerHogan, Heather D, CNM  ondansetron (ZOFRAN ODT) 4 MG disintegrating tablet Take 1 tablet (4 mg total) by mouth every 8 (eight) hours as needed for nausea or vomiting. Patient not taking: Reported on 11/13/2017 06/26/17   Audry PiliMohr, Tyler, PA-C  pantoprazole (PROTONIX) 20 MG tablet Take 1 tablet (20 mg total) by mouth daily. Patient not taking: Reported on 11/13/2017 06/26/17   Audry PiliMohr, Tyler, PA-C    Family History Family History  Problem Relation Age of Onset  . Breast cancer Mother   . Cancer Maternal Grandmother        breast; also great grandma    Social History Social History   Tobacco Use  . Smoking status: Never Smoker  . Smokeless tobacco: Never Used  Substance Use Topics  . Alcohol use: No  . Drug use: Yes    Frequency: 7.0 times per week    Types: Marijuana  Allergies   Amoxicillin and Penicillins   Review of Systems Review of Systems  HENT: Positive for congestion, postnasal drip, rhinorrhea, sinus pressure, sinus pain and sneezing.   Gastrointestinal: Positive for vomiting (x1).  Neurological: Positive for headaches.  All other systems reviewed and are negative.    Physical Exam Updated Vital Signs BP 113/76 (BP Location: Right Arm)   Pulse 89   Temp 97.7 F (36.5 C) (Oral)   Resp 16   Ht 5\' 7"  (1.702 m)   Wt 59.4 kg (131 lb)   SpO2 100%   BMI 20.52 kg/m   Physical Exam  Constitutional: She is oriented to person, place, and time. She appears well-developed and well-nourished. No distress.  NAD.  HENT:  Head: Normocephalic and atraumatic.  Right Ear: External ear normal.  Left Ear: External ear normal.  Moderate mucosal edema bilaterally, no obvious  rhinorrhea. Tenderness with percussion to sinuses bilaterally. Oropharynx, tonsils and TMs normal bilaterally. Moist mucous membranes.  Eyes: Conjunctivae and EOM are normal. No scleral icterus.  Neck: Normal range of motion. Neck supple.  Bilateral submandibular lymphadenopathy. No neck rigidity   Cardiovascular: Normal rate, regular rhythm and normal heart sounds.  No murmur heard. Pulmonary/Chest: Effort normal and breath sounds normal.  Abdominal: Soft. There is no tenderness.  Musculoskeletal: Normal range of motion.  Neurological: She is alert and oriented to person, place, and time.  Skin: Skin is warm and dry. Capillary refill takes less than 2 seconds.  Psychiatric: She has a normal mood and affect. Her behavior is normal. Judgment and thought content normal.  Nursing note and vitals reviewed.    ED Treatments / Results  Labs (all labs ordered are listed, but only abnormal results are displayed) Labs Reviewed - No data to display  EKG  EKG Interpretation None       Radiology No results found.  Procedures Procedures (including critical care time)  Medications Ordered in ED Medications  oxymetazoline (AFRIN) 0.05 % nasal spray 1 spray (not administered)     Initial Impression / Assessment and Plan / ED Course  I have reviewed the triage vital signs and the nursing notes.  Pertinent labs & imaging results that were available during my care of the patient were reviewed by me and considered in my medical decision making (see chart for details).    22 y.o. -year-old female with sinus congestion and pain x 3 days. Has associated symptoms likely viral in nature. Known sick contacts. On my exam patient is nontoxic appearing, speaking in full sentences, w/o increased WOB. No fever, tachypnea, tachycardia, hypoxia. Lungs are CTAB. I do not think that a CXR is indicated at this  time as VS are WNL, there are no signs of consolidation on auscultation and there is no hypoxia.  No significant h/o immunocompromise. Doubt pneumonia.  No sore throat with normal throat exam, rapid strep not thought to be indicated. Given reassuring physical exam, will discharge with symptomatic treatment. Strict ED return precautions given. Patient is aware that a viral URI infection may precede pneumonia or worsening illness. Patient is aware of red flag symptoms to monitor for that would warrant return to the ED for further reevaluation.    Final Clinical Impressions(s) / ED Diagnoses   Final diagnoses:  Viral upper respiratory illness  Acute non-recurrent frontal sinusitis    ED Discharge Orders    None       Jerrell Mylar 12/26/17 0914    Gerhard Munch, MD 12/26/17 [phone]

## 2017-12-26 NOTE — ED Triage Notes (Signed)
Pt sts she has been having migraines, HA and sinus congestion. Denies any N/V/D. A/O x4 no distress noted.

## 2018-01-02 ENCOUNTER — Emergency Department (HOSPITAL_COMMUNITY)
Admission: EM | Admit: 2018-01-02 | Discharge: 2018-01-02 | Disposition: A | Discharge status: Home / Self Care | Payer: Self-pay | Attending: Emergency Medicine | Admitting: Emergency Medicine

## 2018-01-02 ENCOUNTER — Encounter (HOSPITAL_COMMUNITY): Payer: Self-pay | Admitting: Emergency Medicine

## 2018-01-02 ENCOUNTER — Other Ambulatory Visit: Payer: Self-pay

## 2018-01-02 DIAGNOSIS — Z79899 Other long term (current) drug therapy: Secondary | ICD-10-CM | POA: Insufficient documentation

## 2018-01-02 DIAGNOSIS — J019 Acute sinusitis, unspecified: Secondary | ICD-10-CM | POA: Insufficient documentation

## 2018-01-02 DIAGNOSIS — J0100 Acute maxillary sinusitis, unspecified: Secondary | ICD-10-CM

## 2018-01-02 DIAGNOSIS — J45909 Unspecified asthma, uncomplicated: Secondary | ICD-10-CM | POA: Insufficient documentation

## 2018-01-02 LAB — COMPREHENSIVE METABOLIC PANEL
ALT: 15 U/L (ref 14–54)
AST: 19 U/L (ref 15–41)
Albumin: 3.9 g/dL (ref 3.5–5.0)
Alkaline Phosphatase: 64 U/L (ref 38–126)
Anion gap: 8 (ref 5–15)
BILIRUBIN TOTAL: 0.3 mg/dL (ref 0.3–1.2)
BUN: 14 mg/dL (ref 6–20)
CO2: 24 mmol/L (ref 22–32)
CREATININE: 0.6 mg/dL (ref 0.44–1.00)
Calcium: 9.4 mg/dL (ref 8.9–10.3)
Chloride: 107 mmol/L (ref 101–111)
GFR calc Af Amer: >60 mL/min (ref >60)
Glucose, Bld: 93 mg/dL (ref 65–99)
Potassium: 4.1 mmol/L (ref 3.5–5.1)
Sodium: 139 mmol/L (ref 135–145)
TOTAL PROTEIN: 6.8 g/dL (ref 6.5–8.1)

## 2018-01-02 LAB — CBC WITH DIFFERENTIAL/PLATELET
BASOS ABS: 0 10*3/uL (ref 0.0–0.1)
Basophils Relative: 0 %
Eosinophils Absolute: 0.1 10*3/uL (ref 0.0–0.7)
Eosinophils Relative: 1 %
HEMATOCRIT: 35.8 % — AB (ref 36.0–46.0)
HEMOGLOBIN: 11.7 g/dL — AB (ref 12.0–15.0)
Lymphocytes Relative: 56 %
Lymphs Abs: 3.5 10*3/uL (ref 0.7–4.0)
MCH: 27.1 pg (ref 26.0–34.0)
MCHC: 32.7 g/dL (ref 30.0–36.0)
MCV: 83.1 fL (ref 78.0–100.0)
Monocytes Absolute: 0.6 10*3/uL (ref 0.1–1.0)
Monocytes Relative: 9 %
NEUTROS ABS: 2.2 10*3/uL (ref 1.7–7.7)
Neutrophils Relative %: 34 %
Platelets: 251 10*3/uL (ref 150–400)
RBC: 4.31 MIL/uL (ref 3.87–5.11)
RDW: 13.2 % (ref 11.5–15.5)
WBC: 6.4 10*3/uL (ref 4.0–10.5)

## 2018-01-02 LAB — URINALYSIS, ROUTINE W REFLEX MICROSCOPIC
BILIRUBIN URINE: NEGATIVE
GLUCOSE, UA: NEGATIVE mg/dL
HGB URINE DIPSTICK: NEGATIVE
Ketones, ur: NEGATIVE mg/dL
Leukocytes, UA: NEGATIVE
Nitrite: NEGATIVE
PROTEIN: NEGATIVE mg/dL
SPECIFIC GRAVITY, URINE: 1.026 (ref 1.005–1.030)
pH: 5 (ref 5.0–8.0)

## 2018-01-02 LAB — PREGNANCY, URINE: PREG TEST UR: NEGATIVE

## 2018-01-02 MED ORDER — ONDANSETRON 4 MG PO TBDP: 4.0000 mg | ORAL_TABLET | Freq: Three times a day (TID) | ORAL | 0 refills | Status: DC | PRN

## 2018-01-02 MED ORDER — ONDANSETRON 4 MG PO TBDP
4.0000 mg | ORAL_TABLET | Freq: Once | ORAL | Status: AC
  Administered 2018-01-02: 4 mg via ORAL
  Filled 2018-01-02: qty 1

## 2018-01-02 MED ORDER — DOXYCYCLINE HYCLATE 100 MG PO TABS
100.0000 mg | ORAL_TABLET | Freq: Once | ORAL | Status: AC
  Administered 2018-01-02: 100 mg via ORAL
  Filled 2018-01-02: qty 1

## 2018-01-02 MED ORDER — DOXYCYCLINE HYCLATE 100 MG PO CAPS: 100.0000 mg | ORAL_CAPSULE | Freq: Two times a day (BID) | ORAL | 0 refills | Status: DC

## 2018-01-02 NOTE — ED Notes (Signed)
Pt alert and oriented x4. Ambulatory with steady gait. Skin warm and dry. Respirations equal and unlabored. Abdomen soft and non distended. Pt complaint of n/v. States symptoms have not improved. Vital signs stable. Pt has not vomitted since in treatment room

## 2018-01-02 NOTE — Discharge Instructions (Addendum)
Please read and follow all provided instructions.  Your diagnoses today include:  1. Acute non-recurrent maxillary sinusitis     Tests performed today include:  Vital signs. See below for your results today.  Blood counts and electrolytes -normal  Liver and kidney function-normal  Urine test and pregnancy test- negative  Medications prescribed:   Doxycycline - antibiotic  You have been prescribed an antibiotic medicine: take the entire course of medicine even if you are feeling better. Stopping early can cause the antibiotic not to work.   Zofran (ondansetron) - for nausea and vomiting  Take any prescribed medications only as directed.  Home care instructions:  Follow any educational materials contained in this packet.  BE VERY CAREFUL not to take multiple medicines containing Tylenol (also called acetaminophen). Doing so can lead to an overdose which can damage your liver and cause liver failure and possibly death.   Follow-up instructions: Please follow-up with your primary care provider in the next 7 days for further evaluation of your symptoms.   Return instructions:   Please return to the Emergency Department if you experience worsening symptoms.   Please return if you have any other emergent concerns.  Additional Information:  Your vital signs today were: BP 120/83    Pulse 73    Temp 98.1 F (36.7 C) (Oral)    Resp 18    Ht 5\' 7"  (1.702 m)    Wt 59.4 kg (131 lb)    SpO2 100%    BMI 20.52 kg/m  If your blood pressure (BP) was elevated above 135/85 this visit, please have this repeated by your doctor within one month. --------------

## 2018-01-02 NOTE — ED Triage Notes (Signed)
Pt st's she was here last week and dx with sinus infection. And URI  Pt st's she is not getting any better and continues to have nausea and vomiting.

## 2018-01-02 NOTE — ED Provider Notes (Signed)
[facility] Provider Note   CSN: [id number] Arrival date & time: 01/02/18  0013     History   Chief Complaint Chief Complaint  Patient presents with  . Emesis    HPI Rebekah Rhodes is a 22 y.o. female.  Patient presents the emergency department with continued sinus infection type symptoms and vomiting.  Patient was initially seen on 12/26/17 and was treated for a sinus infection with conservative treatment.  Patient has been taking over-the-counter medications without relief.  She continues to have facial tenderness, sinus headache, nasal congestion.  Patient vomits up mucus.  Last episode of vomiting was 7pm yesterday. She has occasional cough.  No fevers or vision changes.  No chest pain or abdominal pain.  The onset of this condition was acute. The course is constant. Aggravating factors: none. Alleviating factors: none.        Past Medical History:  Diagnosis Date  . Anxiety   . Asthma   . Chlamydia   . Depression    was rx meds, has not picked them up  . Eczema   . Genital herpes   . HA (headache)   . Infection    UTI    Patient Active Problem List   Diagnosis Date Noted  . 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 Migraine 03/10/2014  . HA (headache)     Past Surgical History:  Procedure Laterality Date  . NO PAST SURGERIES    . None    . WISDOM TOOTH EXTRACTION      OB History    Gravida Para Term Preterm AB Living   0 0 0 0 0 0   SAB TAB Ectopic Multiple Live Births   0 0 0 0         Home Medications    Prior to Admission medications   Medication Sig Start Date End Date Taking? Authorizing Provider  acyclovir (ZOVIRAX) 400 MG tablet Take 1 tablet (400 mg total) by mouth 3 (three) times daily. 11/13/17   Armando ReichertHogan, Heather D, CNM  albuterol (PROVENTIL HFA;VENTOLIN HFA) 108 (90 Base) MCG/ACT inhaler Inhale 2 puffs into the lungs every 6 (six) hours as needed for wheezing or shortness of breath.  11/28/17   Aviva KluverMurray, Alyssa B, PA-C  doxycycline (VIBRAMYCIN) 100 MG capsule Take 1 capsule (100 mg total) by mouth 2 (two) times daily. 01/02/18   Renne CriglerGeiple, Deriyah Kunath, PA-C  Ibuprofen-Diphenhydramine Cit (IBUPROFEN PM) 200-38 MG TABS Take 1 tablet by mouth at bedtime as needed (pain).    [provider]  metroNIDAZOLE (FLAGYL) 500 MG tablet Take 1 tablet (500 mg total) by mouth 2 (two) times daily. 11/20/17   Thressa ShellerHogan, Heather D, CNM  ondansetron (ZOFRAN ODT) 4 MG disintegrating tablet Take 1 tablet (4 mg total) by mouth every 8 (eight) hours as needed for nausea or vomiting. 01/02/18   Renne CriglerGeiple, Akil Hoos, PA-C    Family History Family History  Problem Relation Age of Onset  . Breast cancer Mother   . Cancer Maternal Grandmother        breast; also great grandma    Social History Social History   Tobacco Use  . Smoking status: Never Smoker  . Smokeless tobacco: Never Used  Substance Use Topics  . Alcohol use: No  . Drug use: Yes    Frequency: 7.0 times per week    Types: Marijuana     Allergies   Amoxicillin and Penicillins   Review of Systems Review of Systems  Constitutional: Negative for chills, fatigue and fever.  HENT: Positive for congestion, sinus pressure and sinus pain. Negative for ear pain, rhinorrhea and sore throat.   Eyes: Negative for redness.  Respiratory: Positive for cough. Negative for wheezing.   Gastrointestinal: Positive for nausea and vomiting. Negative for abdominal pain and diarrhea.  Genitourinary: Negative for dysuria.  Musculoskeletal: Negative for myalgias and neck stiffness.  Skin: Negative for rash.  Neurological: Positive for headaches.  Hematological: Negative for adenopathy.     Physical Exam Updated Vital Signs BP 120/83   Pulse 73   Temp 98.1 F (36.7 C) (Oral)   Resp 18   Ht 5\' 7"  (1.702 m)   Wt 59.4 kg (131 lb)   SpO2 100%   BMI 20.52 kg/m   Physical Exam  Constitutional: She appears well-developed and well-nourished.  HENT:    Head: Normocephalic and atraumatic.  Right Ear: Tympanic membrane, external ear and ear canal normal.  Left Ear: Tympanic membrane, external ear and ear canal normal.  Nose: Mucosal edema present. No rhinorrhea. Right sinus exhibits maxillary sinus tenderness. Left sinus exhibits maxillary sinus tenderness.  Mouth/Throat: Uvula is midline, oropharynx is clear and moist and mucous membranes are normal. Mucous membranes are not dry. No oral lesions. No trismus in the jaw. No uvula swelling. No oropharyngeal exudate, posterior oropharyngeal edema, posterior oropharyngeal erythema or tonsillar abscesses.  Eyes: Conjunctivae are normal. Right eye exhibits no discharge. Left eye exhibits no discharge.  Neck: Normal range of motion. Neck supple.  Cardiovascular: Normal rate, regular rhythm and normal heart sounds.  Pulmonary/Chest: Effort normal and breath sounds normal. No respiratory distress. She has no wheezes. She has no rales.  Abdominal: Soft. There is no tenderness. There is no rebound and no guarding.  Lymphadenopathy:    She has no cervical adenopathy.  Neurological: She is alert.  Skin: Skin is warm and dry.  Psychiatric: She has a normal mood and affect.  Nursing note and vitals reviewed.    ED Treatments / Results  Labs (all labs ordered are listed, but only abnormal results are displayed) Labs Reviewed  CBC WITH DIFFERENTIAL/PLATELET - Abnormal; Notable for the following components:      Result Value   Hemoglobin 11.7 (*)    HCT 35.8 (*)    All other components within normal limits  COMPREHENSIVE METABOLIC PANEL  URINALYSIS, ROUTINE W REFLEX MICROSCOPIC  PREGNANCY, URINE  POC URINE PREG, ED    EKG  EKG Interpretation None       Radiology No results found.  Procedures Procedures (including critical care time)  Medications Ordered in ED Medications  doxycycline (VIBRA-TABS) tablet 100 mg (100 mg Oral Given 01/02/18 0441)  ondansetron (ZOFRAN-ODT) disintegrating  tablet 4 mg (4 mg Oral Given 01/02/18 0442)     Initial Impression / Assessment and Plan / ED Course  I have reviewed the triage vital signs and the nursing notes.  Pertinent labs & imaging results that were available during my care of the patient were reviewed by me and considered in my medical decision making (see chart for details).     Patient seen and examined. Work-up initiated. Medications ordered.   Vital signs reviewed and are as follows: BP 120/83   Pulse 73   Temp 98.1 F (36.7 C) (Oral)   Resp 18   Ht 5\' 7"  (1.702 m)   Wt 59.4 kg (131 lb)   SpO2 100%   BMI 20.52 kg/m    Urine pregnancy is negative.  Will give  course of antibiotics given duration of symptoms without improvement.  Patient urged to return with worsening symptoms or other concerns. Patient verbalized understanding and agrees with plan.    Final Clinical Impressions(s) / ED Diagnoses   Final diagnoses:  Acute non-recurrent maxillary sinusitis   Patient with sinusitis type symptoms for greater than 8 days.  No fevers.  Given duration of symptoms, will give course of doxycycline.  Will treat nausea and vomiting with Zofran.  The symptoms are intermittent and not associated with chest or abdominal pains.  Abdomen is soft and nontender on exam tonight.  UPT negative.  Remainder of lab work is reassuring.  No elevated [place] blood cell count or elevated liver enzymes.  ED Discharge Orders        Ordered    doxycycline (VIBRAMYCIN) 100 MG capsule  2 times daily     01/02/18 0507    ondansetron (ZOFRAN ODT) 4 MG disintegrating tablet  Every 8 hours PRN     01/02/18 0507       Renne Crigler, PA-C 01/02/18 0518    Geoffery Lyons, MD 01/02/18 [phone]

## 2018-03-12 ENCOUNTER — Other Ambulatory Visit (HOSPITAL_COMMUNITY)
Admission: RE | Admit: 2018-03-12 | Discharge: 2018-03-12 | Disposition: A | Discharge status: Home / Self Care | Payer: Medicaid Other | Source: Ambulatory Visit | Attending: Family Medicine | Admitting: Family Medicine

## 2018-03-12 ENCOUNTER — Ambulatory Visit (INDEPENDENT_AMBULATORY_CARE_PROVIDER_SITE_OTHER): Payer: Self-pay | Admitting: *Deleted

## 2018-03-12 DIAGNOSIS — N898 Other specified noninflammatory disorders of vagina: Secondary | ICD-10-CM

## 2018-03-12 DIAGNOSIS — B373 Candidiasis of vulva and vagina: Secondary | ICD-10-CM | POA: Insufficient documentation

## 2018-03-12 DIAGNOSIS — B9689 Other specified bacterial agents as the cause of diseases classified elsewhere: Secondary | ICD-10-CM | POA: Insufficient documentation

## 2018-03-12 MED ORDER — FLUCONAZOLE 150 MG PO TABS: 150.0000 mg | ORAL_TABLET | Freq: Once | ORAL | 0 refills | Status: AC

## 2018-03-12 NOTE — Progress Notes (Signed)
I was present at the time of this nurse visit.  Agree with nursing documentation.   Julie P. Degele, MD OB Fellow   

## 2018-03-12 NOTE — Progress Notes (Signed)
Pt reports having vaginal itching and [place] discharge. She thinks it is a yeast infection which she has had previously.  Rx for diflucan sent to pharmacy per standing protocol. Pt was offered testing for BV, GC,Chlamydia and trich as well as yeast and she accepted. Pt performed self vaginal swab. She was advised that she will be notified of results via MyChart and voiced understanding.

## 2018-03-14 LAB — CERVICOVAGINAL ANCILLARY ONLY
BACTERIAL VAGINITIS: NEGATIVE
CANDIDA VAGINITIS: POSITIVE — AB
CHLAMYDIA, DNA PROBE: NEGATIVE
NEISSERIA GONORRHEA: POSITIVE — AB
TRICH (WINDOWPATH): NEGATIVE

## 2018-03-16 ENCOUNTER — Telehealth: Payer: Self-pay

## 2018-03-16 NOTE — Telephone Encounter (Signed)
Called Pt to advise of Positive Gonorrhea test, asked if she could come in today & she stated she does not get off until 6pm, asked if she could come in Monday morningand she said yes. So I advised we will put her on Nurse visit to get treated. Made pt aware to notify partner to get treated, no sex until 10 days after bothe are treated. Pt verbalized understanding. STD card filled out & faxed.

## 2018-03-17 ENCOUNTER — Other Ambulatory Visit: Payer: Self-pay | Admitting: Obstetrics and Gynecology

## 2018-03-17 MED ORDER — AZITHROMYCIN 500 MG PO TABS: 1000.0000 mg | ORAL_TABLET | Freq: Once | ORAL | 1 refills | Status: AC

## 2018-03-17 NOTE — Progress Notes (Signed)
Pt called in stressing over recent dx of + GC , pt has appt Monday for Tx.  Pt asks for tx over weekend.  I have E-scribed Azithromycin 1 g for pt with 1 refil (for partner), but emphasized thru Call a nurse that pt and partner still need the Rocephin which can be received Monday

## 2018-03-18 ENCOUNTER — Other Ambulatory Visit: Payer: Self-pay | Admitting: Obstetrics & Gynecology

## 2018-03-18 MED ORDER — AZITHROMYCIN 500 MG PO TABS: ORAL_TABLET | ORAL | 0 refills | Status: DC

## 2018-03-19 ENCOUNTER — Ambulatory Visit (INDEPENDENT_AMBULATORY_CARE_PROVIDER_SITE_OTHER): Payer: Self-pay | Admitting: General Practice

## 2018-03-19 ENCOUNTER — Encounter: Payer: Self-pay | Admitting: General Practice

## 2018-03-19 VITALS — BP 126/76 | HR 78 | Ht 67.0 in | Wt 134.0 lb

## 2018-03-19 DIAGNOSIS — R112 Nausea with vomiting, unspecified: Secondary | ICD-10-CM

## 2018-03-19 DIAGNOSIS — A549 Gonococcal infection, unspecified: Secondary | ICD-10-CM

## 2018-03-19 MED ORDER — AZITHROMYCIN 250 MG PO TABS: 2000.0000 mg | ORAL_TABLET | Freq: Once | ORAL | 0 refills | Status: AC

## 2018-03-19 MED ORDER — ONDANSETRON HCL 4 MG PO TABS: 4.0000 mg | ORAL_TABLET | Freq: Three times a day (TID) | ORAL | 0 refills | Status: DC | PRN

## 2018-03-19 MED ORDER — PROMETHAZINE HCL 25 MG/ML IJ SOLN
12.5000 mg | Freq: Once | INTRAMUSCULAR | Status: AC
  Administered 2018-03-19: 12.5 mg via INTRAMUSCULAR

## 2018-03-19 MED ORDER — AZITHROMYCIN 250 MG PO TABS
2000.0000 mg | ORAL_TABLET | Freq: Once | ORAL | Status: AC
  Administered 2018-03-19: 2000 mg via ORAL

## 2018-03-19 MED ORDER — GENTAMICIN SULFATE 40 MG/ML IJ SOLN
240.0000 mg | Freq: Once | INTRAMUSCULAR | Status: AC
  Administered 2018-03-19: 240 mg via INTRAMUSCULAR

## 2018-03-19 NOTE — Progress Notes (Signed)
Patient presents to office today for STD treatment of gonorrhea. Per review, patient has allergic reaction to 'cillin drugs including hives, high fever, excessive vomiting, & diarrhea. Patient reports reaction as a child and had to go to the hospital immediately following. Discussed with Thressa Sheller who recommends alterative treatment with gentamycin  IM & zithromax 2 grams. Meds given. Patient started to vomit severely 10-15 minutes after zithromax was given. Phenergan 12.5 mg IM given to patient per Thressa Sheller. Discussed with Thressa Sheller alternative as zithromax was vomited- will send Rx for zithromax 2 grams & Zofran to pharmacy. Patient is to take zofran with a meal then 45 minutes later take 1 gram and 45 minutes after that take another 1 gram. Discussed directions with patient. Work note given for today. Patient had no questions.

## 2018-03-19 NOTE — Progress Notes (Signed)
I have reviewed the chart and agree with nursing staff's documentation of this patient's encounter.  Heather Hogan, CNM 03/19/2018 12:10 PM    

## 2018-03-26 IMAGING — CR DG CHEST 2V
2 series · 2 of 2 positions shown · non-contrast
Comparison: 02/12/2015, 02/26/2014, 02/19/2014.

CLINICAL DATA: Asthma exacerbation with shortness of breath and
chest tightness.

EXAM:
CHEST  2 VIEW

[w chest pa]
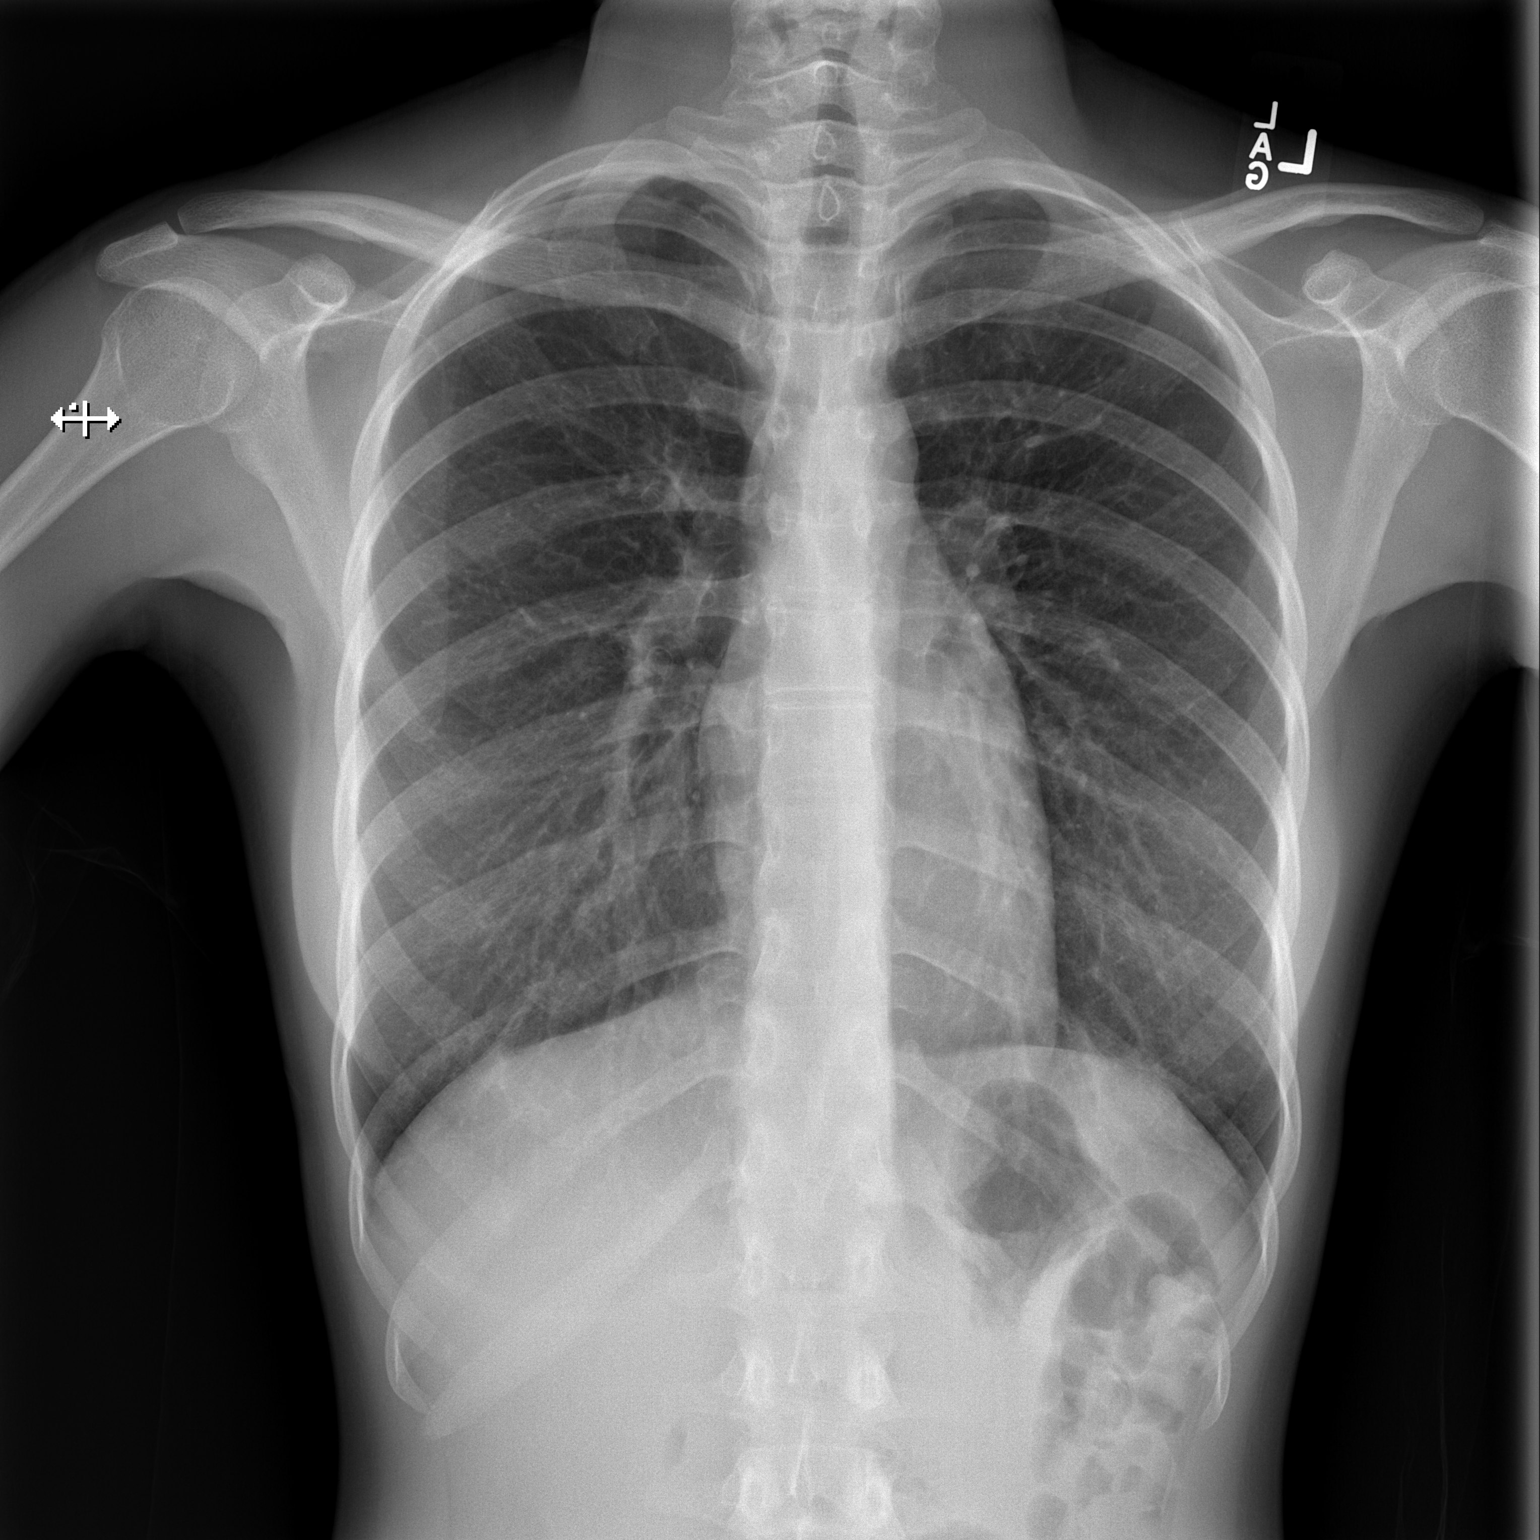

[w chest lat]
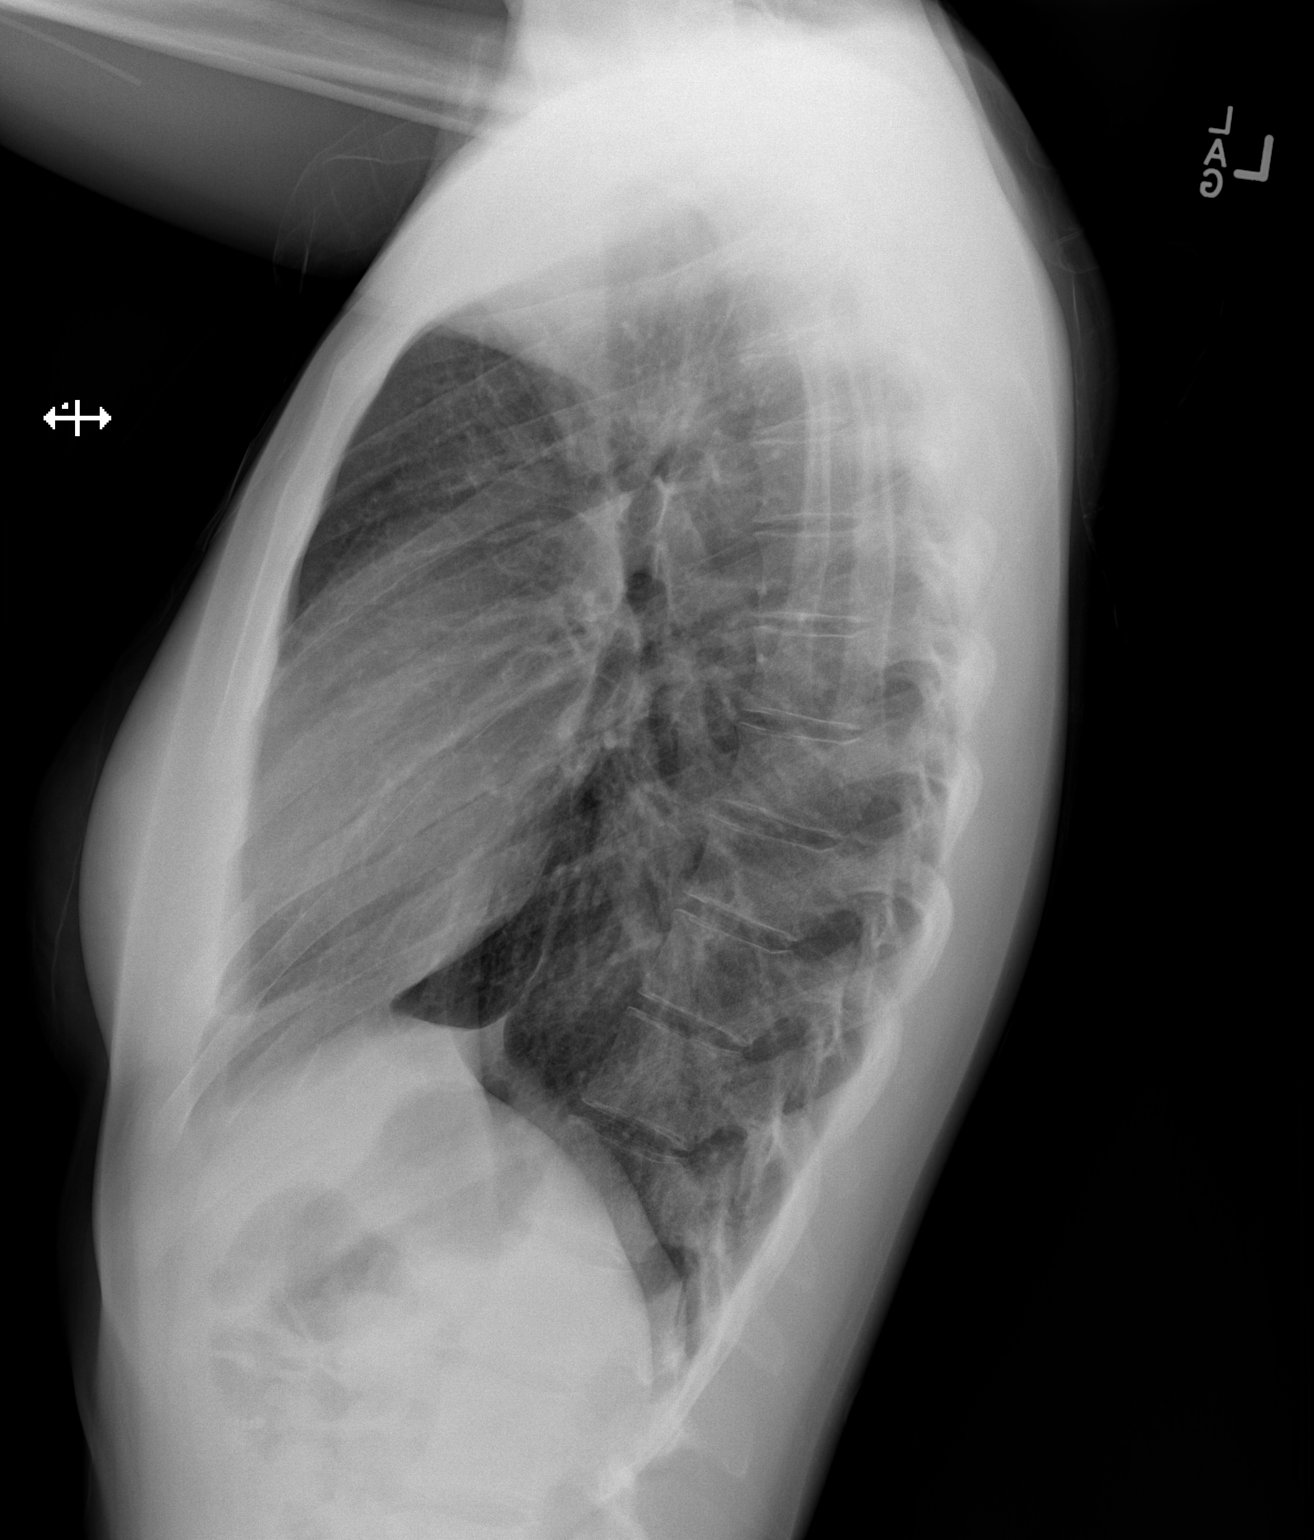

[2 of 2 positions shown; findings below may reference images not displayed]

FINDINGS: Cardiomediastinal silhouette unremarkable, unchanged. Hyperinflation
with mildly prominent bronchovascular markings diffusely and mild
central peribronchial thickening, unchanged. Lungs otherwise clear.
No localized airspace consolidation. No pleural effusions. No
pneumothorax. Normal pulmonary vascularity. Visualized bony thorax
intact.
IMPRESSION: Mild changes of bronchitis and/or asthma without evidence of focal
pneumonia.

## 2018-05-16 ENCOUNTER — Other Ambulatory Visit: Payer: Self-pay

## 2018-05-16 ENCOUNTER — Inpatient Hospital Stay (HOSPITAL_COMMUNITY)
Admission: AD | Admit: 2018-05-16 | Discharge: 2018-05-16 | Disposition: A | Discharge status: Home / Self Care | Payer: Medicaid Other | Source: Ambulatory Visit | Attending: Obstetrics and Gynecology | Admitting: Obstetrics and Gynecology

## 2018-05-16 ENCOUNTER — Encounter (HOSPITAL_COMMUNITY): Payer: Self-pay

## 2018-05-16 DIAGNOSIS — N760 Acute vaginitis: Secondary | ICD-10-CM | POA: Insufficient documentation

## 2018-05-16 DIAGNOSIS — B9689 Other specified bacterial agents as the cause of diseases classified elsewhere: Secondary | ICD-10-CM | POA: Insufficient documentation

## 2018-05-16 DIAGNOSIS — F419 Anxiety disorder, unspecified: Secondary | ICD-10-CM | POA: Insufficient documentation

## 2018-05-16 DIAGNOSIS — N898 Other specified noninflammatory disorders of vagina: Secondary | ICD-10-CM

## 2018-05-16 DIAGNOSIS — B372 Candidiasis of skin and nail: Secondary | ICD-10-CM

## 2018-05-16 DIAGNOSIS — Z880 Allergy status to penicillin: Secondary | ICD-10-CM | POA: Insufficient documentation

## 2018-05-16 DIAGNOSIS — B009 Herpesviral infection, unspecified: Secondary | ICD-10-CM | POA: Insufficient documentation

## 2018-05-16 DIAGNOSIS — F329 Major depressive disorder, single episode, unspecified: Secondary | ICD-10-CM | POA: Insufficient documentation

## 2018-05-16 DIAGNOSIS — Z79899 Other long term (current) drug therapy: Secondary | ICD-10-CM | POA: Insufficient documentation

## 2018-05-16 DIAGNOSIS — J45909 Unspecified asthma, uncomplicated: Secondary | ICD-10-CM | POA: Insufficient documentation

## 2018-05-16 HISTORY — DX: Other specified bacterial agents as the cause of diseases classified elsewhere: B96.89

## 2018-05-16 HISTORY — DX: Other specified noninflammatory disorders of vagina: N89.8

## 2018-05-16 HISTORY — DX: Acute vaginitis: N76.0

## 2018-05-16 HISTORY — DX: Candidiasis of skin and nail: B37.2

## 2018-05-16 LAB — URINALYSIS, ROUTINE W REFLEX MICROSCOPIC
BILIRUBIN URINE: NEGATIVE
Glucose, UA: NEGATIVE mg/dL
Hgb urine dipstick: NEGATIVE
KETONES UR: NEGATIVE mg/dL
Leukocytes, UA: NEGATIVE
NITRITE: NEGATIVE
PH: 8 (ref 5.0–8.0)
Protein, ur: NEGATIVE mg/dL
Specific Gravity, Urine: 1.02 (ref 1.005–1.030)

## 2018-05-16 LAB — WET PREP, GENITAL
SPERM: NONE SEEN
Trich, Wet Prep: NONE SEEN
Yeast Wet Prep HPF POC: NONE SEEN

## 2018-05-16 LAB — POCT PREGNANCY, URINE: Preg Test, Ur: NEGATIVE

## 2018-05-16 MED ORDER — ACYCLOVIR 400 MG PO TABS: 400.0000 mg | ORAL_TABLET | Freq: Three times a day (TID) | ORAL | 2 refills | Status: DC

## 2018-05-16 MED ORDER — FLUCONAZOLE 150 MG PO TABS: 150.0000 mg | ORAL_TABLET | Freq: Every day | ORAL | 0 refills | Status: DC

## 2018-05-16 MED ORDER — NYSTATIN-TRIAMCINOLONE 100000-0.1 UNIT/GM-% EX CREA: TOPICAL_CREAM | CUTANEOUS | 0 refills | Status: DC

## 2018-05-16 NOTE — MAU Note (Addendum)
Pt C/O vaginal irritation x 2 days, clear discharge, itching.  Denies pain, has had some bleeding from scratching.  Has hx of herpes, thinks she could possibly have an outbreak.

## 2018-05-16 NOTE — MAU Provider Note (Signed)
History     CSN: [id number]  Arrival date and time: 05/16/18 1713   First Provider Initiated Contact with Patient 05/16/18 1835      Chief Complaint  Patient presents with  . Vaginal Discharge  . Vaginal Itching   HPI  Ms.  Rebekah Rhodes is a 22 y.o. year old [id number] non-pregnant female who presents to MAU reporting vaginal irritation, vaginal clear d/c x 2 days. She has a known dx of HSV and thinks she is having an outbreak at this time. She denies having a new sexual partner. She reports using condoms, but "not every time".  Past Medical History:  Diagnosis Date  . Anxiety   . Asthma   . Chlamydia   . Depression    was rx meds, has not picked them up  . Eczema   . Genital herpes   . HA (headache)   . Infection    UTI    Past Surgical History:  Procedure Laterality Date  . NO PAST SURGERIES    . None    . WISDOM TOOTH EXTRACTION      Family History  Problem Relation Age of Onset  . Breast cancer Mother   . Cancer Maternal Grandmother        breast; also great grandma    Social History   Tobacco Use  . Smoking status: Never Smoker  . Smokeless tobacco: Never Used  Substance Use Topics  . Alcohol use: No  . Drug use: Yes    Frequency: 7.0 times per week    Types: Marijuana    Allergies:  Allergies  Allergen Reactions  . Amoxicillin Hives, Diarrhea and Nausea And Vomiting    Childhood allergy  . Penicillins Hives, Diarrhea and Nausea And Vomiting    Has patient had a PCN reaction causing immediate rash, facial/tongue/throat swelling, SOB or lightheadedness with hypotension: Yes Has patient had a PCN reaction causing severe rash involving mucus membranes or skin necrosis: No Has patient had a PCN reaction that required hospitalization No Has patient had a PCN reaction occurring within the last 10 years: No If all of the above answers are "NO", then may proceed with Cephalosporin    Medications Prior to Admission  Medication Sig Dispense Refill  Last Dose  . acyclovir (ZOVIRAX) 400 MG tablet Take 1 tablet (400 mg total) by mouth 3 (three) times daily. (Patient not taking: Reported on 03/12/2018) 15 tablet 2 Not Taking  . albuterol (PROVENTIL HFA;VENTOLIN HFA) 108 (90 Base) MCG/ACT inhaler Inhale 2 puffs into the lungs every 6 (six) hours as needed for wheezing or shortness of breath. (Patient not taking: Reported on 03/12/2018) 1 Inhaler 2 Not Taking  . Ibuprofen-Diphenhydramine Cit (IBUPROFEN PM) 200-38 MG TABS Take 1 tablet by mouth at bedtime as needed (pain).   Not Taking  . ondansetron (ZOFRAN) 4 MG tablet Take 1 tablet (4 mg total) by mouth every 8 (eight) hours as needed for nausea or vomiting. 4 tablet 0     Review of Systems  Constitutional: Negative.   HENT: Negative.   Eyes: Negative.   Respiratory: Negative.   Cardiovascular: Negative.   Gastrointestinal: Negative.   Endocrine: Negative.   Genitourinary: Positive for vaginal pain (? HSV lesions x 2-3 days).  Musculoskeletal: Negative.   Skin: Negative.   Allergic/Immunologic: Negative.   Neurological: Negative.   Hematological: Negative.   Psychiatric/Behavioral: Negative.    Physical Exam   Blood pressure 120/69, pulse 80, temperature 98.2 F (36.8 C), temperature source Oral,  resp. rate 16, height 5\' 7"  (1.702 m), weight 56.2 kg (124 lb).  Physical Exam  Nursing note and vitals reviewed. Constitutional: She is oriented to person, place, and time. She appears well-developed and well-nourished.  HENT:  Head: Normocephalic and atraumatic.  Eyes: Pupils are equal, round, and reactive to light.  Neck: Normal range of motion.  Cardiovascular: Normal rate.  Respiratory: Effort normal.  GI: Soft.  Genitourinary:  Genitourinary Comments: Uterus: non-tender, Labia: dried, thick grayish-[place] discharge noted in between labial folds and along perineum -- suspicious for cutaneous candida. SE: cervix is smooth, pink, no lesions, small amt of thick, chunky, [place] vaginal  d/c -- WP, GC/CT done, closed/long/firm, no CMT or friability, no adnexal tenderness   Musculoskeletal: Normal range of motion.  Neurological: She is alert and oriented to person, place, and time.  Skin: Skin is warm and dry.  Psychiatric: She has a normal mood and affect. Her behavior is normal. Judgment and thought content normal.    MAU Course  Procedures  MDM CCUA UPT Wet Prep GC/CT -- pending  Results for orders placed or performed during the hospital encounter of 05/16/18 (from the past 24 hour(s))  Urinalysis, Routine w reflex microscopic     Status: Abnormal   Collection Time: 05/16/18  5:55 PM  Result Value Ref Range   Color, Urine YELLOW YELLOW   APPearance CLOUDY (A) CLEAR   Specific Gravity, Urine 1.020 1.005 - 1.030   pH 8.0 5.0 - 8.0   Glucose, UA NEGATIVE NEGATIVE mg/dL   Hgb urine dipstick NEGATIVE NEGATIVE   Bilirubin Urine NEGATIVE NEGATIVE   Ketones, ur NEGATIVE NEGATIVE mg/dL   Protein, ur NEGATIVE NEGATIVE mg/dL   Nitrite NEGATIVE NEGATIVE   Leukocytes, UA NEGATIVE NEGATIVE  Pregnancy, urine POC     Status: None   Collection Time: 05/16/18  6:01 PM  Result Value Ref Range   Preg Test, Ur NEGATIVE NEGATIVE  Wet prep, genital     Status: Abnormal   Collection Time: 05/16/18  6:47 PM  Result Value Ref Range   Yeast Wet Prep HPF POC NONE SEEN NONE SEEN   Trich, Wet Prep NONE SEEN NONE SEEN   Clue Cells Wet Prep HPF POC PRESENT (A) NONE SEEN   WBC, Wet Prep HPF POC MODERATE (A) NONE SEEN   Sperm NONE SEEN     Assessment and Plan  Bacterial vaginitis  - Rx for Flagyl 500 mg BID x 7 days - Information provided on BV   Cutaneous candidiasis  - Information provided on skin yeast infection - Rx's for Diflucan 150 mg po x 1 and Nystatin-Triamcinolone cream applied to affected area daily  HSV-1 (herpes simplex virus 1) infection   - Renewed Rx for acyclovir - Information provided on HSV  - Discharge home - Patient verbalized an understanding of the  plan of care and agrees.     Rebekah Mora, MSN, CNM 05/16/2018, 6:35 PM

## 2018-05-17 LAB — GC/CHLAMYDIA PROBE AMP (~~LOC~~) NOT AT ARMC
CHLAMYDIA, DNA PROBE: NEGATIVE
Neisseria Gonorrhea: NEGATIVE

## 2018-05-20 ENCOUNTER — Encounter: Payer: Self-pay | Admitting: Advanced Practice Midwife

## 2018-05-21 ENCOUNTER — Other Ambulatory Visit: Payer: Self-pay | Admitting: General Practice

## 2018-05-21 DIAGNOSIS — B9689 Other specified bacterial agents as the cause of diseases classified elsewhere: Secondary | ICD-10-CM

## 2018-05-21 DIAGNOSIS — N760 Acute vaginitis: Principal | ICD-10-CM

## 2018-05-21 MED ORDER — METRONIDAZOLE 500 MG PO TABS: 500.0000 mg | ORAL_TABLET | Freq: Two times a day (BID) | ORAL | 0 refills | Status: DC

## 2018-06-20 ENCOUNTER — Inpatient Hospital Stay (HOSPITAL_COMMUNITY)
Admission: AD | Admit: 2018-06-20 | Discharge: 2018-06-20 | Disposition: A | Discharge status: Home / Self Care | Payer: Medicaid Other | Source: Ambulatory Visit | Attending: Obstetrics and Gynecology | Admitting: Obstetrics and Gynecology

## 2018-06-20 ENCOUNTER — Encounter (HOSPITAL_COMMUNITY): Payer: Self-pay | Admitting: *Deleted

## 2018-06-20 DIAGNOSIS — Z880 Allergy status to penicillin: Secondary | ICD-10-CM | POA: Insufficient documentation

## 2018-06-20 DIAGNOSIS — N760 Acute vaginitis: Secondary | ICD-10-CM

## 2018-06-20 DIAGNOSIS — B9689 Other specified bacterial agents as the cause of diseases classified elsewhere: Secondary | ICD-10-CM

## 2018-06-20 DIAGNOSIS — F17210 Nicotine dependence, cigarettes, uncomplicated: Secondary | ICD-10-CM | POA: Insufficient documentation

## 2018-06-20 DIAGNOSIS — F1290 Cannabis use, unspecified, uncomplicated: Secondary | ICD-10-CM | POA: Insufficient documentation

## 2018-06-20 DIAGNOSIS — B373 Candidiasis of vulva and vagina: Secondary | ICD-10-CM

## 2018-06-20 DIAGNOSIS — B3731 Acute candidiasis of vulva and vagina: Secondary | ICD-10-CM

## 2018-06-20 LAB — URINALYSIS, ROUTINE W REFLEX MICROSCOPIC
BILIRUBIN URINE: NEGATIVE
Glucose, UA: NEGATIVE mg/dL
Ketones, ur: NEGATIVE mg/dL
NITRITE: NEGATIVE
PH: 6 (ref 5.0–8.0)
Protein, ur: NEGATIVE mg/dL
SPECIFIC GRAVITY, URINE: 1.018 (ref 1.005–1.030)

## 2018-06-20 LAB — WET PREP, GENITAL
SPERM: NONE SEEN
TRICH WET PREP: NONE SEEN

## 2018-06-20 LAB — POCT PREGNANCY, URINE: Preg Test, Ur: NEGATIVE

## 2018-06-20 MED ORDER — METRONIDAZOLE 500 MG PO TABS: 500.0000 mg | ORAL_TABLET | Freq: Two times a day (BID) | ORAL | 0 refills | Status: DC

## 2018-06-20 MED ORDER — FLUCONAZOLE 150 MG PO TABS: 150.0000 mg | ORAL_TABLET | Freq: Every day | ORAL | 1 refills | Status: DC

## 2018-06-20 NOTE — Discharge Instructions (Signed)
Bacterial Vaginosis Bacterial vaginosis is an infection of the vagina. It happens when too many germs (bacteria) grow in the vagina. This infection puts you at risk for infections from sex (STIs). Treating this infection can lower your risk for some STIs. You should also treat this if you are pregnant. It can cause your baby to be born early. Follow these instructions at home: Medicines  Take over-the-counter and prescription medicines only as told by your doctor.  Take or use your antibiotic medicine as told by your doctor. Do not stop taking or using it even if you start to feel better. General instructions  If you your sexual partner is a woman, tell her that you have this infection. She needs to get treatment if she has symptoms. If you have a female partner, he does not need to be treated.  During treatment: ? Avoid sex. ? Do not douche. ? Avoid alcohol as told. ? Avoid breastfeeding as told.  Drink enough fluid to keep your pee (urine) clear or pale yellow.  Keep your vagina and butt (rectum) clean. ? Wash the area with warm water every day. ? Wipe from front to back after you use the toilet.  Keep all follow-up visits as told by your doctor. This is important. Preventing this condition  Do not douche.  Use only warm water to wash around your vagina.  Use protection when you have sex. This includes: ? Latex condoms. ? Dental dams.  Limit how many people you have sex with. It is best to only have sex with the same person (be monogamous).  Get tested for STIs. Have your partner get tested.  Wear underwear that is cotton or lined with cotton.  Avoid tight pants and pantyhose. This is most important in summer.  Do not use any products that have nicotine or tobacco in them. These include cigarettes and e-cigarettes. If you need help quitting, ask your doctor.  Do not use illegal drugs.  Limit how much alcohol you drink. Contact a doctor if:  Your symptoms do not get  better, even after you are treated.  You have more discharge or pain when you pee (urinate).  You have a fever.  You have pain in your belly (abdomen).  You have pain with sex.  Your bleed from your vagina between periods. Summary  This infection happens when too many germs (bacteria) grow in the vagina.  Treating this condition can lower your risk for some infections from sex (STIs).  You should also treat this if you are pregnant. It can cause early (premature) birth.  Do not stop taking or using your antibiotic medicine even if you start to feel better. This information is not intended to replace advice given to you by your health care provider. Make sure you discuss any questions you have with your health care provider. Document Released: 07/26/2008 Document Revised: 07/02/2016 Document Reviewed: 07/02/2016 Elsevier Interactive Patient Education  2017 [employer]. Vaginal Yeast infection, Adult Vaginal yeast infection is a condition that causes soreness, swelling, and redness (inflammation) of the vagina. It also causes vaginal discharge. This is a common condition. Some women get this infection frequently. What are the causes? This condition is caused by a change in the normal balance of the yeast (candida) and bacteria that live in the vagina. This change causes an overgrowth of yeast, which causes the inflammation. What increases the risk? This condition is more likely to develop in:  Women who take antibiotic medicines.  Women who have   diabetes.  Women who take birth control pills.  Women who are pregnant.  Women who douche often.  Women who have a weak defense (immune) system.  Women who have been taking steroid medicines for a long time.  Women who frequently wear tight clothing.  What are the signs or symptoms? Symptoms of this condition include:  [place], thick vaginal discharge.  Swelling, itching, redness, and irritation of the vagina. The lips of the  vagina (vulva) may be affected as well.  Pain or a burning feeling while urinating.  Pain during sex.  How is this diagnosed? This condition is diagnosed with a medical history and physical exam. This will include a pelvic exam. Your health care provider will examine a sample of your vaginal discharge under a microscope. Your health care provider may send this sample for testing to confirm the diagnosis. How is this treated? This condition is treated with medicine. Medicines may be over-the-counter or prescription. You may be told to use one or more of the following:  Medicine that is taken orally.  Medicine that is applied as a cream.  Medicine that is inserted directly into the vagina (suppository).  Follow these instructions at home:  Take or apply over-the-counter and prescription medicines only as told by your health care provider.  Do not have sex until your health care provider has approved. Tell your sex partner that you have a yeast infection. That person should go to his or her health care provider if he or she develops symptoms.  Do not wear tight clothes, such as pantyhose or tight pants.  Avoid using tampons until your health care provider approves.  Eat more yogurt. This may help to keep your yeast infection from returning.  Try taking a sitz bath to help with discomfort. This is a warm water bath that is taken while you are sitting down. The water should only come up to your hips and should cover your buttocks. Do this 3-4 times per day or as told by your health care provider.  Do not douche.  Wear breathable, cotton underwear.  If you have diabetes, keep your blood sugar levels under control. Contact a health care provider if:  You have a fever.  Your symptoms go away and then return.  Your symptoms do not get better with treatment.  Your symptoms get worse.  You have new symptoms.  You develop blisters in or around your vagina.  You have blood coming  from your vagina and it is not your menstrual period.  You develop pain in your abdomen. This information is not intended to replace advice given to you by your health care provider. Make sure you discuss any questions you have with your health care provider. Document Released: 07/27/2005 Document Revised: 03/30/2016 Document Reviewed: 04/20/2015 Elsevier Interactive Patient Education  2018 [employer] Inc.  

## 2018-06-20 NOTE — MAU Note (Signed)
Pt C/O vaginal irritation & itching since Tuesday, became more intense last night.  Had clear/[place] discharge running down her leg this morning.  Denies bleeding or pain.

## 2018-06-20 NOTE — MAU Provider Note (Signed)
History  Rebekah Rhodes is a 22 year-old G0P0 who presents with vaginal itching and discharge for 1 day. She first noticed the itching last night and when she woke up this morning she noticed a [place]-ish discharge running down her leg. She denies bleeding, burning, or pain. She denies burning with urination, blood in urine, nausea, vomiting, or fever.   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 She also reports a history of Gonorrhea and Chlamydia, which was treated a few months ago. Chart review also reveals that she was seen and treated for BV and Candidiasis about 1 month ago.   She is sexually active with 1 female partner. She has a Nexplanon and uses condoms inconsistently.   She reports no significant chronic medical issues and no medications. She reports using marijuana 3-4 times per week. She does not smoke cigarettes, drink alochol, or use any other illicit drugs.   CSN: [id number]  Arrival date and time: 06/20/18 [id number]   First Provider Initiated Contact with Patient 06/20/18 [phone]      Chief Complaint  Patient presents with  . Vaginal Discharge  . Vaginal Itching   HPI  OB History    Gravida  0   Para  0   Term  0   Preterm  0   AB  0   Living  0     SAB  0   TAB  0   Ectopic  0   Multiple  0   Live Births              Past Medical History:  Diagnosis Date  . Anxiety   . Asthma   . Chlamydia   . Depression    was rx meds, has not picked them up  . Eczema   . Genital herpes   . HA (headache)   . Infection    UTI    Past Surgical History:  Procedure Laterality Date  . NO PAST SURGERIES    . None    . WISDOM TOOTH EXTRACTION      Family History  Problem Relation Age of Onset  . Breast cancer Mother   . Cancer Maternal Grandmother        breast; also great grandma    Social History   Tobacco Use  . Smoking status: Current Some Day Smoker    Packs/day: 0.25   Types: Cigarettes  . Smokeless tobacco: Never Used  Substance Use Topics  . Alcohol use: No  . Drug use: Not Currently    Frequency: 7.0 times per week    Types: Marijuana    Allergies:  Allergies  Allergen Reactions  . Amoxicillin Hives, Diarrhea and Nausea And Vomiting    Childhood allergy  . Penicillins Hives, Diarrhea and Nausea And Vomiting    Has patient had a PCN reaction causing immediate rash, facial/tongue/throat swelling, SOB or lightheadedness with hypotension: Yes Has patient had a PCN reaction causing severe rash involving mucus membranes or skin necrosis: No Has patient had a PCN reaction that required hospitalization No Has patient had a PCN reaction occurring within the last 10 years: No If all of the above answers are "NO", then may proceed with Cephalosporin    Medications Prior to Admission  Medication Sig Dispense Refill Last Dose  . acyclovir (ZOVIRAX) 400 MG tablet Take 1 tablet (400 mg total) by mouth 3 (three) times daily. 15 tablet 2   .  albuterol (PROVENTIL HFA;VENTOLIN HFA) 108 (90 Base) MCG/ACT inhaler Inhale 2 puffs into the lungs every 6 (six) hours as needed for wheezing or shortness of breath. (Patient not taking: Reported on 03/12/2018) 1 Inhaler 2 Not Taking  . fluconazole (DIFLUCAN) 150 MG tablet Take 1 tablet (150 mg total) by mouth daily. 1 tablet 0   . metroNIDAZOLE (FLAGYL) 500 MG tablet Take 1 tablet (500 mg total) by mouth 2 (two) times daily. 14 tablet 0   . nystatin-triamcinolone (MYCOLOG II) cream Apply to affected area daily 15 g 0     Review of Systems  Constitutional: Negative for fever.  Gastrointestinal: Negative for constipation, nausea and vomiting.  Genitourinary: Positive for vaginal discharge. Negative for difficulty urinating, dysuria and hematuria.   Physical Exam   Blood pressure 112/66, pulse 85, temperature 98.6 F (37 C), temperature source Oral, resp. rate 16, height 5\' 7"  (1.702 m), weight 54.5 kg.  Gen:  Well-appearing, NAD Resp: No respiratory distress GU: Normal external genitalia, moderate [place] discharge on lateral vaginal walls, cervix moderately erythematous, no cervical motion tenderness, no adnexal masses  MAU Course  Procedures  MDM UA, Urine pregnancy, wet prep ordered, GC/Chlamydia ordered. UA revealed leukocytes, bacteria, no Nitrites. Urine pregnancy negative. Wet prep showed yeast, clue cells. Treatment provided for BV, Candidal vaginitis.     Assessment and Plan  Rebekah Rhodes is a 22 year-old G0P0 who presents with 1 day history of vaginal itching and [place] discharge. Given her symptoms, exam, and history of STI, this presentation is most concerning for vaginitis or cervicitis.    Vaginal Itching/Discharge - Urine Pregnancy Test (Negative) - UA (Large leukocytes, rare bacteria, small Hgb, no Nitrites) - Wet Prep (BV, Yeast) - GC/Chlamydia probe (pending)  - Offered additional STI testing (HIV, Hepatitis, Syphilis), but patient declined - Recommended that patient visit STI clinic at [facility] - Metronidazole 500mg  BID x 7 days - Fluconazole 150mg  once   Rebekah Rhodes, MS3 06/20/2018, 9:57 AM

## 2018-06-20 NOTE — MAU Provider Note (Signed)
Chief Complaint: Vaginal Discharge and Vaginal Itching   First Provider Initiated Contact with Patient 06/20/18 [phone]     SUBJECTIVE HPI: Rebekah Rhodes is a 22 y.o. G0P0000 who presents to [facility] reporting vaginal discharge & irritation. Symptoms began yesterday with vaginal itching. This morning noticed thin Miu discharge "running down her leg". Hx of gonorrhea, chlamydia, & HSV. Denies recent HSV outbreak. She is sexually active with 1 partner x 4 months & does not use condoms regularly. No change in soaps or detergents. Denies dyspareunia or postcoital bleeding. Denies abdominal pain or dysuria.  Does not have a PCP.   Past Medical History:  Diagnosis Date  . Anxiety   . Asthma   . Chlamydia   . Depression    was rx meds, has not picked them up  . Eczema   . Genital herpes   . HA (headache)   . Infection    UTI   OB History  Gravida Para Term Preterm AB Living  0 0 0 0 0 0  SAB TAB Ectopic Multiple Live Births  0 0 0 0     Past Surgical History:  Procedure Laterality Date  . NO PAST SURGERIES    . None    . WISDOM TOOTH EXTRACTION     Social History   Socioeconomic History  . Marital status: Single    Spouse name: Not on file  . Number of children: 0  . Years of education: 57  . Highest education level: Not on file  Occupational History    Comment: High school student  Social Needs  . Financial resource strain: Not on file  . Food insecurity:    Worry: Not on file    Inability: Not on file  . Transportation needs:    Medical: Not on file    Non-medical: Not on file  Tobacco Use  . Smoking status: Current Some Day Smoker    Packs/day: 0.25    Types: Cigarettes  . Smokeless tobacco: Never Used  Substance and Sexual Activity  . Alcohol use: No  . Drug use: Not Currently    Frequency: 7.0 times per week    Types: Marijuana  . Sexual activity: Yes    Birth control/protection: Implant  Lifestyle  . Physical activity:    Days per week: Not  on file    Minutes per session: Not on file  . Stress: Not on file  Relationships  . Social connections:    Talks on phone: Not on file    Gets together: Not on file    Attends religious service: Not on file    Active member of club or organization: Not on file    Attends meetings of clubs or organizations: Not on file    Relationship status: Not on file  . Intimate partner violence:    Fear of current or ex partner: Not on file    Emotionally abused: Not on file    Physically abused: Not on file    Forced sexual activity: Not on file  Other Topics Concern  . Not on file  Social History Narrative   Patient lives at home with her father Johnesha Acheampong).   Patient is a high Ecologist.   Right handed.   Caffeine soda one daily.   Family History  Problem Relation Age of Onset  . Breast cancer Mother   . Cancer Maternal Grandmother        breast; also great grandma   No current  facility-administered medications on file prior to encounter.    Current Outpatient Medications on File Prior to Encounter  Medication Sig Dispense Refill  . acyclovir (ZOVIRAX) 400 MG tablet Take 1 tablet (400 mg total) by mouth 3 (three) times daily. 15 tablet 2  . albuterol (PROVENTIL HFA;VENTOLIN HFA) 108 (90 Base) MCG/ACT inhaler Inhale 2 puffs into the lungs every 6 (six) hours as needed for wheezing or shortness of breath. (Patient not taking: Reported on 03/12/2018) 1 Inhaler 2  . fluconazole (DIFLUCAN) 150 MG tablet Take 1 tablet (150 mg total) by mouth daily. 1 tablet 0  . metroNIDAZOLE (FLAGYL) 500 MG tablet Take 1 tablet (500 mg total) by mouth 2 (two) times daily. 14 tablet 0  . nystatin-triamcinolone (MYCOLOG II) cream Apply to affected area daily 15 g 0   Allergies  Allergen Reactions  . Amoxicillin Hives, Diarrhea and Nausea And Vomiting    Childhood allergy  . Penicillins Hives, Diarrhea and Nausea And Vomiting    Has patient had a PCN reaction causing immediate rash,  facial/tongue/throat swelling, SOB or lightheadedness with hypotension: Yes Has patient had a PCN reaction causing severe rash involving mucus membranes or skin necrosis: No Has patient had a PCN reaction that required hospitalization No Has patient had a PCN reaction occurring within the last 10 years: No If all of the above answers are "NO", then may proceed with Cephalosporin    I have reviewed patient's Past Medical Hx, Surgical Hx, Family Hx, Social Hx, medications and allergies.   Review of Systems  Constitutional: Negative.   Gastrointestinal: Negative.   Genitourinary: Positive for vaginal discharge. Negative for dyspareunia, dysuria and vaginal bleeding.    OBJECTIVE Patient Vitals for the past 24 hrs:  BP Temp Temp src Pulse Resp Height Weight  06/20/18 0933 112/66 98.6 F (37 C) Oral 85 16 - -  06/20/18 0923 - - - - - 5\' 7"  (1.702 m) 54.5 kg   Constitutional: Well-developed, well-nourished female in no acute distress.  Cardiovascular: normal rate & rhythm, no murmur Respiratory: normal rate and effort. Lung sounds clear throughout GI: Abd soft, non-tender, Pos BS x 4. No guarding or rebound tenderness MS: Extremities nontender, no edema, normal ROM Neurologic: Alert and oriented x 4.  GU:     SPECULUM EXAM: NEFG, Small amount of clumpy yellow discharge adherent to  vaginal walls. Cervix erythematous with foul smelling yellow discharge.   BIMANUAL: No CMT. uterus normal size, no adnexal tenderness or masses.    LAB RESULTS Results for orders placed or performed during the hospital encounter of 06/20/18 (from the past 24 hour(s))  Urinalysis, Routine w reflex microscopic     Status: Abnormal   Collection Time: 06/20/18  9:31 AM  Result Value Ref Range   Color, Urine YELLOW YELLOW   APPearance CLOUDY (A) CLEAR   Specific Gravity, Urine 1.018 1.005 - 1.030   pH 6.0 5.0 - 8.0   Glucose, UA NEGATIVE NEGATIVE mg/dL   Hgb urine dipstick SMALL (A) NEGATIVE   Bilirubin  Urine NEGATIVE NEGATIVE   Ketones, ur NEGATIVE NEGATIVE mg/dL   Protein, ur NEGATIVE NEGATIVE mg/dL   Nitrite NEGATIVE NEGATIVE   Leukocytes, UA LARGE (A) NEGATIVE   RBC / HPF 21-50 0 - 5 RBC/hpf   WBC, UA 21-50 0 - 5 WBC/hpf   Bacteria, UA RARE (A) NONE SEEN   Squamous Epithelial / LPF 6-10 0 - 5   Mucus PRESENT   Pregnancy, urine POC     Status: None  Collection Time: 06/20/18  9:38 AM  Result Value Ref Range   Preg Test, Ur NEGATIVE NEGATIVE  Wet prep, genital     Status: Abnormal   Collection Time: 06/20/18 10:30 AM  Result Value Ref Range   Yeast Wet Prep HPF POC PRESENT (A) NONE SEEN   Trich, Wet Prep NONE SEEN NONE SEEN   Clue Cells Wet Prep HPF POC PRESENT (A) NONE SEEN   WBC, Wet Prep HPF POC MANY (A) NONE SEEN   Sperm NONE SEEN     MAU COURSE Orders Placed This Encounter  Procedures  . Wet prep, genital  . Urinalysis, Routine w reflex microscopic  . Pregnancy, urine POC  . Discharge patient   Meds ordered this encounter  Medications  . metroNIDAZOLE (FLAGYL) 500 MG tablet    Sig: Take 1 tablet (500 mg total) by mouth 2 (two) times daily.    Dispense:  14 tablet    Refill:  0    Order Specific Question:   Supervising Provider    Answer:   [place] BingPICKENS, CHARLIE [id number]]  . fluconazole (DIFLUCAN) 150 MG tablet    Sig: Take 1 tablet (150 mg total) by mouth daily.    Dispense:  1 tablet    Refill:  1    Order Specific Question:   Supervising Provider    Answer:   [place] BingPICKENS, CHARLIE [[id number]]    MDM UPT negative GC/CT & wet prep collected Pt declines blood work for further STI testing ASSESSMENT 1. Vaginal yeast infection   2. BV (bacterial vaginosis)     PLAN Discharge home in stable condition. Tx for yeast & BV - no alcohol with flagyl GC/CT pending Safe sex practices F/u with [facility] for routine STI screening  Follow-up Information    [facility]   Contact information: [address] [place]  [phone]          Allergies as of 06/20/2018      Reactions   Amoxicillin Hives, Diarrhea, Nausea And Vomiting   Childhood allergy   Penicillins Hives, Diarrhea, Nausea And Vomiting   Has patient had a PCN reaction causing immediate rash, facial/tongue/throat swelling, SOB or lightheadedness with hypotension: Yes Has patient had a PCN reaction causing severe rash involving mucus membranes or skin necrosis: No Has patient had a PCN reaction that required hospitalization No Has patient had a PCN reaction occurring within the last 10 years: No If all of the above answers are "NO", then may proceed with Cephalosporin      Medication List    TAKE these medications   acyclovir 400 MG tablet Commonly known as:  ZOVIRAX Take 1 tablet (400 mg total) by mouth 3 (three) times daily.   albuterol 108 (90 Base) MCG/ACT inhaler Commonly known as:  PROVENTIL HFA;VENTOLIN HFA Inhale 2 puffs into the lungs every 6 (six) hours as needed for wheezing or shortness of breath.   fluconazole 150 MG tablet Commonly known as:  DIFLUCAN Take 1 tablet (150 mg total) by mouth daily.   metroNIDAZOLE 500 MG tablet Commonly known as:  FLAGYL Take 1 tablet (500 mg total) by mouth 2 (two) times daily.   nystatin-triamcinolone cream Commonly known as:  MYCOLOG II Apply to affected area daily        Judeth HornLawrence, Davona Kinoshita, NP 06/20/2018  11:14 AM

## 2018-06-21 LAB — GC/CHLAMYDIA PROBE AMP (~~LOC~~) NOT AT ARMC
Chlamydia: NEGATIVE
NEISSERIA GONORRHEA: NEGATIVE

## 2018-07-10 ENCOUNTER — Encounter: Payer: Self-pay | Admitting: *Deleted

## 2018-08-07 ENCOUNTER — Other Ambulatory Visit: Payer: Self-pay

## 2018-08-07 ENCOUNTER — Encounter (HOSPITAL_COMMUNITY): Payer: Self-pay | Admitting: *Deleted

## 2018-08-07 ENCOUNTER — Emergency Department (HOSPITAL_COMMUNITY)
Admission: EM | Admit: 2018-08-07 | Discharge: 2018-08-07 | Disposition: A | Discharge status: Home / Self Care | Payer: Medicaid Other | Attending: Emergency Medicine | Admitting: Emergency Medicine

## 2018-08-07 DIAGNOSIS — Z87891 Personal history of nicotine dependence: Secondary | ICD-10-CM | POA: Insufficient documentation

## 2018-08-07 DIAGNOSIS — B9689 Other specified bacterial agents as the cause of diseases classified elsewhere: Secondary | ICD-10-CM | POA: Insufficient documentation

## 2018-08-07 DIAGNOSIS — Z79899 Other long term (current) drug therapy: Secondary | ICD-10-CM | POA: Insufficient documentation

## 2018-08-07 DIAGNOSIS — B9789 Other viral agents as the cause of diseases classified elsewhere: Secondary | ICD-10-CM | POA: Insufficient documentation

## 2018-08-07 DIAGNOSIS — J069 Acute upper respiratory infection, unspecified: Secondary | ICD-10-CM | POA: Insufficient documentation

## 2018-08-07 DIAGNOSIS — B3731 Acute candidiasis of vulva and vagina: Secondary | ICD-10-CM

## 2018-08-07 DIAGNOSIS — B373 Candidiasis of vulva and vagina: Secondary | ICD-10-CM | POA: Insufficient documentation

## 2018-08-07 DIAGNOSIS — J45909 Unspecified asthma, uncomplicated: Secondary | ICD-10-CM | POA: Insufficient documentation

## 2018-08-07 DIAGNOSIS — N760 Acute vaginitis: Secondary | ICD-10-CM | POA: Insufficient documentation

## 2018-08-07 LAB — URINALYSIS, ROUTINE W REFLEX MICROSCOPIC
BACTERIA UA: NONE SEEN
BILIRUBIN URINE: NEGATIVE
Glucose, UA: NEGATIVE mg/dL
HGB URINE DIPSTICK: NEGATIVE
KETONES UR: NEGATIVE mg/dL
NITRITE: NEGATIVE
PROTEIN: NEGATIVE mg/dL
Specific Gravity, Urine: 1.016 (ref 1.005–1.030)
pH: 7 (ref 5.0–8.0)

## 2018-08-07 LAB — WET PREP, GENITAL
Sperm: NONE SEEN
Trich, Wet Prep: NONE SEEN

## 2018-08-07 LAB — PREGNANCY, URINE: Preg Test, Ur: NEGATIVE

## 2018-08-07 LAB — GROUP A STREP BY PCR: Group A Strep by PCR: NOT DETECTED

## 2018-08-07 MED ORDER — BENZONATATE 100 MG PO CAPS: 100.0000 mg | ORAL_CAPSULE | Freq: Three times a day (TID) | ORAL | 0 refills | Status: AC | PRN

## 2018-08-07 MED ORDER — METRONIDAZOLE 500 MG PO TABS
2000.0000 mg | ORAL_TABLET | Freq: Once | ORAL | Status: AC
  Administered 2018-08-07: 2000 mg via ORAL
  Filled 2018-08-07: qty 4

## 2018-08-07 MED ORDER — AZITHROMYCIN 250 MG PO TABS
1000.0000 mg | ORAL_TABLET | Freq: Once | ORAL | Status: AC
  Administered 2018-08-07: 1000 mg via ORAL
  Filled 2018-08-07: qty 4

## 2018-08-07 MED ORDER — CEFTRIAXONE SODIUM 250 MG IJ SOLR
250.0000 mg | Freq: Once | INTRAMUSCULAR | Status: AC
  Administered 2018-08-07: 250 mg via INTRAMUSCULAR
  Filled 2018-08-07: qty 250

## 2018-08-07 MED ORDER — STERILE WATER FOR INJECTION IJ SOLN
INTRAMUSCULAR | Status: AC
  Administered 2018-08-07: 2.1 mL
  Filled 2018-08-07: qty 10

## 2018-08-07 MED ORDER — FLUCONAZOLE 150 MG PO TABS
150.0000 mg | ORAL_TABLET | Freq: Once | ORAL | Status: AC
  Administered 2018-08-07: 150 mg via ORAL
  Filled 2018-08-07: qty 1

## 2018-08-07 MED ORDER — ONDANSETRON 4 MG PO TBDP
4.0000 mg | ORAL_TABLET | Freq: Once | ORAL | Status: AC
  Administered 2018-08-07: 4 mg via ORAL
  Filled 2018-08-07: qty 1

## 2018-08-07 NOTE — Discharge Instructions (Addendum)
You have been treated today for gonorrhea, chlamydia, bacterial vaginosis and yeast infection. After today, you do not need to take any additional medication. It is important that you abstain from sex for the next 7 days.  For your upper respiratory infection, it is likely due to a virus. I have sent you a prescription for Tessalon which is a cough medication. Your symptoms should begin to improve over the next 3-5 days.  Please take care of yourself and be safe!

## 2018-08-07 NOTE — ED Triage Notes (Addendum)
Three days of URI symptoms and sore throat About 1/2 way through triage, "Oh, I also have a bacteria infection"

## 2018-08-07 NOTE — ED Provider Notes (Signed)
[facility]-EMERGENCY DEPT Provider Note  CSN: [id number] Arrival date & time: 08/07/18  1234    History   Chief Complaint Chief Complaint  Patient presents with  . Sore Throat  . URI    HPI Rebekah Rhodes is a 22 y.o. female with no significant medical history who presented to the ED for upper respiratory and vaginal complaints.  URI   This is a new problem. Episode onset: 3 days ago. The problem has not changed since onset.There has been no fever. Associated symptoms include congestion, rhinorrhea, sore throat and cough. Pertinent negatives include no chest pain, no abdominal pain, no diarrhea, no nausea, no vomiting, no dysuria, no ear pain, no headaches, no plugged ear sensation, no sinus pain, no sneezing, no swollen glands, no joint pain, no neck pain, no rash and no wheezing. Associated symptoms comments: Postnasal drip. Treatments tried: OTC cold/flu products. The treatment provided mild relief.  Vaginal Discharge   This is a new problem. Episode onset: 3 days ago. The problem has not changed since onset.The discharge was Santa and thick. She is not pregnant. She has not missed her period. Associated symptoms include genital itching. Pertinent negatives include no fever, no abdominal pain, no constipation, no diarrhea, no nausea, no vomiting, no dyspareunia, no dysuria, no frequency and no genital burning. She has tried nothing for the symptoms. Her past medical history is significant for STD.    Past Medical History:  Diagnosis Date  . Anxiety   . Asthma   . Chlamydia   . Depression    was rx meds, has not picked them up  . Eczema   . Genital herpes   . HA (headache)   . Infection    UTI    Patient Active Problem List   Diagnosis Date Noted  . Bacterial vaginitis 05/16/2018  . Vaginal irritation 05/16/2018  . Cutaneous candidiasis 05/16/2018  . 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 Migraine 03/10/2014  . HA (headache)      Past Surgical History:  Procedure Laterality Date  . NO PAST SURGERIES    . None    . WISDOM TOOTH EXTRACTION       OB History    Gravida  0   Para  0   Term  0   Preterm  0   AB  0   Living  0     SAB  0   TAB  0   Ectopic  0   Multiple  0   Live Births               Home Medications    Prior to Admission medications   Medication Sig Start Date End Date Taking? Authorizing Provider  albuterol (PROVENTIL HFA;VENTOLIN HFA) 108 (90 Base) MCG/ACT inhaler Inhale 2 puffs into the lungs every 6 (six) hours as needed for wheezing or shortness of breath. 11/28/17  Yes [place] Scrape, Alyssa B, PA-C  acyclovir (ZOVIRAX) 400 MG tablet Take 1 tablet (400 mg total) by mouth 3 (three) times daily. Patient not taking: Reported on 08/07/2018 05/16/18   Raelyn Mora, CNM  benzonatate (TESSALON) 100 MG capsule Take 1 capsule (100 mg total) by mouth 3 (three) times daily as needed for up to 7 days for cough. 08/07/18 08/14/18  Christi Wirick, Jerrel Ivory I, PA-C  fluconazole (DIFLUCAN) 150 MG tablet Take 1 tablet (150 mg total) by mouth daily. Patient not taking: Reported on 08/07/2018 06/20/18   Judeth Horn, NP  metroNIDAZOLE (FLAGYL)  500 MG tablet Take 1 tablet (500 mg total) by mouth 2 (two) times daily. Patient not taking: Reported on 08/07/2018 06/20/18   Judeth Horn, NP  nystatin-triamcinolone [facility] II) cream Apply to affected area daily Patient not taking: Reported on 08/07/2018 05/16/18   Raelyn Mora, CNM    Family History Family History  Problem Relation Age of Onset  . Breast cancer Mother   . Cancer Maternal Grandmother        breast; also great grandma    Social History Social History   Tobacco Use  . Smoking status: Former Smoker    Packs/day: 0.25    Types: Cigarettes  . Smokeless tobacco: Never Used  Substance Use Topics  . Alcohol use: No  . Drug use: Not Currently    Frequency: 7.0 times per week    Types: Marijuana     Allergies   Amoxicillin  and Penicillins   Review of Systems Review of Systems  Constitutional: Negative for fever.  HENT: Positive for congestion, rhinorrhea and sore throat. Negative for ear pain, sinus pain and sneezing.   Eyes: Negative.   Respiratory: Positive for cough. Negative for wheezing.   Cardiovascular: Negative for chest pain.  Gastrointestinal: Negative for abdominal pain, constipation, diarrhea, nausea and vomiting.  Genitourinary: Positive for vaginal discharge. Negative for dyspareunia, dysuria, frequency, vaginal bleeding and vaginal pain.  Musculoskeletal: Negative.  Negative for arthralgias, joint pain and neck pain.  Skin: Negative for rash.  Neurological: Negative for headaches.     Physical Exam Updated Vital Signs BP 124/74 (BP Location: Right Arm)   Pulse 87   Temp 98.6 F (37 C) (Oral)   Resp 16   Ht 5\' 7"  (1.702 m)   Wt 52.6 kg   SpO2 100%   BMI 18.17 kg/m   Physical Exam  Constitutional: She appears well-developed and well-nourished. No distress.  HENT:  Right Ear: Tympanic membrane and ear canal normal.  Left Ear: Tympanic membrane and ear canal normal.  Mouth/Throat: Uvula is midline, oropharynx is clear and moist and mucous membranes are normal. No oropharyngeal exudate, posterior oropharyngeal edema or posterior oropharyngeal erythema. Tonsils are 0 on the right. Tonsils are 0 on the left. No tonsillar exudate.  Eyes: Pupils are equal, round, and reactive to light. EOM are normal.  Neck: Normal range of motion. Neck supple.  Cardiovascular: Normal rate, regular rhythm and normal heart sounds.  Pulmonary/Chest: Effort normal and breath sounds normal.  Abdominal: Soft. Bowel sounds are normal. She exhibits no distension. There is no tenderness.  Genitourinary: There is no rash or lesion on the right labia. There is no rash or lesion on the left labia. Cervix exhibits discharge. Cervix exhibits no motion tenderness and no friability. No erythema or tenderness in the  vagina. Vaginal discharge found.  Genitourinary Comments: Thick, Guerrero discharge from cervix and in vagina.  Lymphadenopathy:    She has no cervical adenopathy. No inguinal adenopathy noted on the right or left side.  Skin: Skin is warm. Capillary refill takes less than 2 seconds. No rash noted.  Nursing note and vitals reviewed.    ED Treatments / Results  Labs (all labs ordered are listed, but only abnormal results are displayed) Labs Reviewed  WET PREP, GENITAL - Abnormal; Notable for the following components:      Result Value   Yeast Wet Prep HPF POC PRESENT (*)    Clue Cells Wet Prep HPF POC PRESENT (*)    WBC, Wet Prep HPF POC MANY (*)  All other components within normal limits  URINALYSIS, ROUTINE W REFLEX MICROSCOPIC - Abnormal; Notable for the following components:   Leukocytes, UA MODERATE (*)    All other components within normal limits  GROUP A STREP BY PCR  PREGNANCY, URINE  RPR  HIV ANTIBODY (ROUTINE TESTING W REFLEX)  GC/CHLAMYDIA PROBE AMP ([place]) NOT AT [facility]    EKG None  Radiology No results found.  Procedures Procedures (including critical care time)  Medications Ordered in ED Medications  metroNIDAZOLE (FLAGYL) tablet 2,000 mg (has no administration in time range)  fluconazole (DIFLUCAN) tablet 150 mg (has no administration in time range)  ondansetron (ZOFRAN-ODT) disintegrating tablet 4 mg (has no administration in time range)  cefTRIAXone (ROCEPHIN) injection 250 mg (250 mg Intramuscular Given 08/07/18 1527)  azithromycin (ZITHROMAX) tablet 1,000 mg (1,000 mg Oral Given 08/07/18 1527)  sterile water (preservative free) injection (2.1 mLs  Given 08/07/18 1527)     Initial Impression / Assessment and Plan / ED Course  Triage vital signs and the nursing notes have been reviewed.  Pertinent labs & imaging results that were available during care of the patient were reviewed and considered in medical decision making (see chart for  details).  Patient presents afebrile to the ED for upper respiratory complaints of sore throat, rhinorrhea, postnasal drip and congestion x3 days. History and unremarkable physical exam points to viral etiology. There are no s/s to suggest a specific bacterial etiology such as strep pharyngitis, otitis media/externia, bacterial sinusitits, etc that warrants antibiotics.  Patient also has vaginal complaints of vaginal discharge, pressure and pruritus x3 days. She reports being sexually active. On physical exam, there is thick, [place] discharge seen in vagina and coming from cervix. No CMT or friability of cervix.  Clinical Course as of Aug 08 1607  Tue Aug 07, 2018  1537 UA not suggestive of UTI.   [GM]  1557 Wet prep + for BV and yeast. Will treat BV in ED with Flagyl 2g x1 and yeast with Diflucan 150mg  x1.    [GM]    Clinical Course User Index [GM] Candid Bovey, Sharyon Medicus, PA-C   Final Clinical Impressions(s) / ED Diagnoses  1. Viral URI. Education provided on OTC and supportive treatment for symptom relief. Rx for Tessalon prescribed for cough. 2. Bacterial Vaginosis. Flagyl 2g x1 given in the ED. 3. Yeast Vaginal Infection. Diflucan 150mg  x1 given in the ED.  Dispo: Home. After thorough clinical evaluation, this patient is determined to be medically stable and can be safely discharged with the previously mentioned treatment and/or outpatient follow-up/referral(s). At this time, there are no other apparent medical conditions that require further screening, evaluation or treatment.  Final diagnoses:  Viral URI with cough  Bacterial vaginosis  Yeast vaginitis    ED Discharge Orders         Ordered    benzonatate (TESSALON) 100 MG capsule  3 times daily PRN     08/07/18 1604            Kamani Lewter, [place] I, PA-C 08/09/18 0044    Raeford Razor, MD 08/11/18 0003

## 2018-08-07 NOTE — ED Notes (Signed)
Pelvic cart at bedside. 

## 2018-08-08 LAB — RPR: RPR Ser Ql: NONREACTIVE

## 2018-08-08 LAB — HIV ANTIBODY (ROUTINE TESTING W REFLEX): HIV Screen 4th Generation wRfx: NONREACTIVE

## 2018-08-09 LAB — GC/CHLAMYDIA PROBE AMP (~~LOC~~) NOT AT ARMC
CHLAMYDIA, DNA PROBE: POSITIVE — AB
NEISSERIA GONORRHEA: NEGATIVE

## 2018-08-21 ENCOUNTER — Emergency Department (HOSPITAL_COMMUNITY)
Admission: EM | Admit: 2018-08-21 | Discharge: 2018-08-22 | Disposition: A | Discharge status: Other Acute Inpatient Hospital | Payer: Medicaid Other | Attending: Emergency Medicine | Admitting: Emergency Medicine

## 2018-08-21 DIAGNOSIS — F32A Depression, unspecified: Secondary | ICD-10-CM

## 2018-08-21 DIAGNOSIS — R45851 Suicidal ideations: Secondary | ICD-10-CM | POA: Insufficient documentation

## 2018-08-21 DIAGNOSIS — F332 Major depressive disorder, recurrent severe without psychotic features: Secondary | ICD-10-CM | POA: Insufficient documentation

## 2018-08-21 DIAGNOSIS — F1220 Cannabis dependence, uncomplicated: Secondary | ICD-10-CM | POA: Insufficient documentation

## 2018-08-21 DIAGNOSIS — F329 Major depressive disorder, single episode, unspecified: Secondary | ICD-10-CM

## 2018-08-21 DIAGNOSIS — F419 Anxiety disorder, unspecified: Secondary | ICD-10-CM | POA: Insufficient documentation

## 2018-08-21 NOTE — ED Triage Notes (Signed)
Pt brought in by [employer] under IVC d/t pt texting a friend saying she she had taken pills.  Pt denies taking the pills stating "I didn't take them.  I just said that.  I have depression."

## 2018-08-21 NOTE — ED Notes (Signed)
Bed: ZO10 Expected date:  Expected time:  Means of arrival:  Comments: [employer] IVC female

## 2018-08-22 ENCOUNTER — Encounter (HOSPITAL_COMMUNITY): Payer: Self-pay | Admitting: *Deleted

## 2018-08-22 ENCOUNTER — Inpatient Hospital Stay (HOSPITAL_COMMUNITY)
Admission: AD | Admit: 2018-08-22 | Discharge: 2018-08-24 | DRG: 885 | Disposition: A | Discharge status: Home / Self Care | Payer: Federal, State, Local not specified - Other | Attending: Psychiatry | Admitting: Psychiatry

## 2018-08-22 ENCOUNTER — Other Ambulatory Visit: Payer: Self-pay

## 2018-08-22 DIAGNOSIS — Z599 Problem related to housing and economic circumstances, unspecified: Secondary | ICD-10-CM | POA: Diagnosis not present

## 2018-08-22 DIAGNOSIS — R45851 Suicidal ideations: Secondary | ICD-10-CM | POA: Diagnosis present

## 2018-08-22 DIAGNOSIS — Z79899 Other long term (current) drug therapy: Secondary | ICD-10-CM

## 2018-08-22 DIAGNOSIS — Z880 Allergy status to penicillin: Secondary | ICD-10-CM | POA: Diagnosis not present

## 2018-08-22 DIAGNOSIS — Z9114 Patient's other noncompliance with medication regimen: Secondary | ICD-10-CM | POA: Diagnosis not present

## 2018-08-22 DIAGNOSIS — F4310 Post-traumatic stress disorder, unspecified: Secondary | ICD-10-CM | POA: Diagnosis present

## 2018-08-22 DIAGNOSIS — J45909 Unspecified asthma, uncomplicated: Secondary | ICD-10-CM | POA: Diagnosis present

## 2018-08-22 DIAGNOSIS — F332 Major depressive disorder, recurrent severe without psychotic features: Secondary | ICD-10-CM | POA: Diagnosis present

## 2018-08-22 LAB — COMPREHENSIVE METABOLIC PANEL
ALK PHOS: 50 U/L (ref 38–126)
ALT: 11 U/L (ref 0–44)
ANION GAP: 7 (ref 5–15)
AST: 19 U/L (ref 15–41)
Albumin: 4.7 g/dL (ref 3.5–5.0)
BILIRUBIN TOTAL: 0.7 mg/dL (ref 0.3–1.2)
BUN: 14 mg/dL (ref 6–20)
CALCIUM: 9.1 mg/dL (ref 8.9–10.3)
CO2: 24 mmol/L (ref 22–32)
Chloride: 108 mmol/L (ref 98–111)
Creatinine, Ser: 0.58 mg/dL (ref 0.44–1.00)
Glucose, Bld: 88 mg/dL (ref 70–99)
Potassium: 3.5 mmol/L (ref 3.5–5.1)
SODIUM: 139 mmol/L (ref 135–145)
TOTAL PROTEIN: 7.6 g/dL (ref 6.5–8.1)

## 2018-08-22 LAB — CBC
HCT: 37.4 % (ref 36.0–46.0)
Hemoglobin: 11.9 g/dL — ABNORMAL LOW (ref 12.0–15.0)
MCH: 26.9 pg (ref 26.0–34.0)
MCHC: 31.8 g/dL (ref 30.0–36.0)
MCV: 84.4 fL (ref 80.0–100.0)
PLATELETS: 221 10*3/uL (ref 150–400)
RBC: 4.43 MIL/uL (ref 3.87–5.11)
RDW: 12.9 % (ref 11.5–15.5)
WBC: 5.1 10*3/uL (ref 4.0–10.5)
nRBC: 0 % (ref 0.0–0.2)

## 2018-08-22 LAB — ACETAMINOPHEN LEVEL

## 2018-08-22 LAB — RAPID URINE DRUG SCREEN, HOSP PERFORMED
Amphetamines: NOT DETECTED
Barbiturates: NOT DETECTED
Benzodiazepines: NOT DETECTED
Cocaine: NOT DETECTED
OPIATES: NOT DETECTED
TETRAHYDROCANNABINOL: POSITIVE — AB

## 2018-08-22 LAB — SALICYLATE LEVEL

## 2018-08-22 LAB — ETHANOL

## 2018-08-22 LAB — PREGNANCY, URINE: Preg Test, Ur: NEGATIVE

## 2018-08-22 MED ORDER — VENLAFAXINE HCL ER 37.5 MG PO CP24
37.5000 mg | ORAL_CAPSULE | Freq: Every day | ORAL | Status: DC
  Administered 2018-08-22: 37.5 mg via ORAL
  Filled 2018-08-22 (×5): qty 1

## 2018-08-22 MED ORDER — MAGNESIUM HYDROXIDE 400 MG/5ML PO SUSP: 30.0000 mL | Freq: Every day | ORAL | Status: DC | PRN

## 2018-08-22 MED ORDER — TRAZODONE HCL 50 MG PO TABS
50.0000 mg | ORAL_TABLET | Freq: Every evening | ORAL | Status: DC | PRN
  Filled 2018-08-22 (×2): qty 1

## 2018-08-22 MED ORDER — ACETAMINOPHEN 325 MG PO TABS: 650.0000 mg | ORAL_TABLET | ORAL | Status: DC | PRN

## 2018-08-22 MED ORDER — HYDROXYZINE HCL 25 MG PO TABS: 25.0000 mg | ORAL_TABLET | Freq: Four times a day (QID) | ORAL | Status: DC | PRN

## 2018-08-22 MED ORDER — TRAZODONE 25 MG HALF TABLET
25.0000 mg | ORAL_TABLET | Freq: Every evening | ORAL | Status: DC | PRN
  Filled 2018-08-22 (×6): qty 1

## 2018-08-22 MED ORDER — ACETAMINOPHEN 325 MG PO TABS: 650.0000 mg | ORAL_TABLET | Freq: Four times a day (QID) | ORAL | Status: DC | PRN

## 2018-08-22 MED ORDER — HYDROXYZINE HCL 10 MG PO TABS: 10.0000 mg | ORAL_TABLET | Freq: Four times a day (QID) | ORAL | Status: DC | PRN

## 2018-08-22 MED ORDER — ALUM & MAG HYDROXIDE-SIMETH 200-200-20 MG/5ML PO SUSP: 30.0000 mL | ORAL | Status: DC | PRN

## 2018-08-22 MED ORDER — ONDANSETRON HCL 4 MG PO TABS: 4.0000 mg | ORAL_TABLET | Freq: Three times a day (TID) | ORAL | Status: DC | PRN

## 2018-08-22 MED ORDER — LORAZEPAM 1 MG PO TABS: 1.0000 mg | ORAL_TABLET | ORAL | Status: DC | PRN

## 2018-08-22 MED ORDER — ZOLPIDEM TARTRATE 5 MG PO TABS: 5.0000 mg | ORAL_TABLET | Freq: Every evening | ORAL | Status: DC | PRN

## 2018-08-22 MED ORDER — RISPERIDONE 1 MG PO TBDP: 2.0000 mg | ORAL_TABLET | Freq: Three times a day (TID) | ORAL | Status: DC | PRN

## 2018-08-22 MED ORDER — ZIPRASIDONE MESYLATE 20 MG IM SOLR: 20.0000 mg | INTRAMUSCULAR | Status: DC | PRN

## 2018-08-22 NOTE — Progress Notes (Signed)
Per Donell Sievert; PA; Pt meets inpatient criteria. Dr. Blinda Leatherwood and Nurse, Consuella Lose notified of disposition.

## 2018-08-22 NOTE — Progress Notes (Signed)
Nursing Progress Note: 7p-7a D: Pt currently presents with a depressed/anxious/pleasant/worried affect and behavior. Pt states "My friends really screwed me over. I was trying to talk about my feelings and give myself a better living environment when I was IVC'd. I have had depression for as long as I can remember, and I haven't taken meds in a while. I would like to stay off of them if possible. I want outpatient service more than anything. Just to be able to talk about what I am going through would be so helpful." Interacting appropriately with the milieu. Pt reports good sleep during the previous night with current medication regimen. Pt did attend wrap-up group.  A: Pt provided with medications per providers orders. Pt's labs and vitals were monitored throughout the night. Pt supported emotionally and encouraged to express concerns and questions. Pt educated on medications.  R: Pt's safety ensured with 15 minute and environmental checks. Pt currently denies SI, HI, and AVH. Pt verbally contracts to seek staff if SI,HI, or AVH occurs and to consult with staff before acting on any harmful thoughts. Will continue to monitor.

## 2018-08-22 NOTE — ED Notes (Signed)
Pt A&O x 3, no distress noted, calm & cooperative, guarded and tearful, pt presents with Depression, denies SI, HI or AVH.  Pt under IVC, told a friend she had ingested some pills, then denied saying she was depressed.  Monitoring for safety, Q 15 min checks in effect.

## 2018-08-22 NOTE — BH Assessment (Signed)
[facility] Assessment Progress Note  Pt accepted to [facility] 400-2. Accepting provider is Donell Sievert, [place]. Attending provider is Dr. Jola Babinski. Pt is under IVC and will be transported by LE. Call report to [phone]. Pt can arrive by 10am. Pt's RN, Diane, notified.   Johny Shock. Ladona Ridgel, MS, NCC, LPCA Counselor

## 2018-08-22 NOTE — BH Assessment (Addendum)
Assessment Note  Rebekah Rhodes is an 22 y.o., single female. Pt presented to [facility] under IVC by friend. Pt presented accompanied by her father. Pt reports that her friend IVC'd her because she text her friend stating that she was depressed and took some pills. Pt stated, "I told her I did, but I did not." IVC paperwork states, "The respondent told me she was going to commit suicide last night by taking prescription drugs. I could not get in touch with her after awhile. When I spoke to her today she told me it was a failed attempt. Even though she does this often, I have spoken to her about this problem and she becomes aggressive in her attitude telling me that "I can't tell her what to do." Her mother seems very apathetic about the situation but I feel that my friend needs help because she does this often. She told me that she is depressed and on medication for it." Pt reports that she told her friend that she was depressed, but denied taking the medications. Pt told the provider, Dr. Blinda Leatherwood that she had in fact taken the pills. Pt reports that following symptoms: tearfulness, worthlessness, isolation, irritability and hypersomnia. Pt reports loss of interest and staying in bed often. Pt denied HI/AH/VH. Pt reported no hx of trauma. Pt reports smoking marijuana 3-4 days per week. Pt denied being on medication for MH currently. Pt reports seeing a therapist at [facility] in the past, with the last time being one year ago.   Pt reports living with her mother and sister. Pt reports being employed, but having no insurance. Pt stated that she has no legal hx. Pt reports that her father is very supportive.   Pt oriented to person, place, time and situation. Pt presented alert, dressed appropriately in scrubs and groomed. Pt spoke clearly, coherently and did not seem to be under the influence of any substances. Pt made good eye contact and answered questions appropriately. Pt presented irritable and  stated that she wanted to leave the hospital to go home. Pt presented tearful, depressed, but calm and open to the mostly assessment process. Pt presented with no impairments of remote or recent memory.   Diagnosis: F33.2 Major depressive disorder, Recurrent episode, Severe F12.20 Cannabis use disorder, Moderate   Past Medical History:  Past Medical History:  Diagnosis Date  . Anxiety   . Asthma   . Chlamydia   . Depression    was rx meds, has not picked them up  . Eczema   . Genital herpes   . HA (headache)   . Infection    UTI    Past Surgical History:  Procedure Laterality Date  . NO PAST SURGERIES    . None    . WISDOM TOOTH EXTRACTION      Family History:  Family History  Problem Relation Age of Onset  . Breast cancer Mother   . Cancer Maternal Grandmother        breast; also great grandma    Social History:  reports that she has quit smoking. Her smoking use included cigarettes. She smoked 0.25 packs per day. She has never used smokeless tobacco. She reports that she has current or past drug history. Drug: Marijuana. Frequency: 7.00 times per week. She reports that she does not drink alcohol.  Additional Social History:  Alcohol / Drug Use Pain Medications: See MAR. Prescriptions: Pt denies current MH medications.  Over the Counter: See MAR.  History of  alcohol / drug use?: Yes Longest period of sobriety (when/how long): Denied.  Substance #1 Name of Substance 1: Marijuana 1 - Age of First Use: 16 1 - Amount (size/oz): 1 Blunt  1 - Frequency: 3-4 Times Weekly  1 - Duration: 5 Years  1 - Last Use / Amount: 08/21/2018  CIWA: CIWA-Ar BP: (!) 133/94 Pulse Rate: 81 COWS:    Allergies:  Allergies  Allergen Reactions  . Amoxicillin Hives, Diarrhea and Nausea And Vomiting    Childhood allergy Has patient had a PCN reaction causing immediate rash, facial/tongue/throat swelling, SOB or lightheadedness with hypotension: Yes Has patient had a PCN reaction  causing severe rash involving mucus membranes or skin necrosis: No Has patient had a PCN reaction that required hospitalization: No Has patient had a PCN reaction occurring within the last 10 years: No If all of the above answers are "NO", then may proceed with Cephalosporin use.  Marland Kitchen Penicillins Hives, Diarrhea and Nausea And Vomiting    Has patient had a PCN reaction causing immediate rash, facial/tongue/throat swelling, SOB or lightheadedness with hypotension: Yes Has patient had a PCN reaction causing severe rash involving mucus membranes or skin necrosis: No Has patient had a PCN reaction that required hospitalization No Has patient had a PCN reaction occurring within the last 10 years: No If all of the above answers are "NO", then may proceed with Cephalosporin    Home Medications:  (Not in a hospital admission)  OB/GYN Status:  No LMP recorded. Patient has had an implant.  General Assessment Data Location of Assessment: [facility] TTS Assessment: In system Is this a Tele or Face-to-Face Assessment?: Face-to-Face Is this an Initial Assessment or a Re-assessment for this encounter?: Initial Assessment Patient Accompanied by:: Other(Father ) Language Other than English: No Living Arrangements: Other (Comment)(Own Home ) What gender do you identify as?: Female Marital status: Single Maiden name: N/A Pregnancy Status: No Living Arrangements: Parent, Other relatives(Mother and Sister) Can pt return to current living arrangement?: Yes Admission Status: Involuntary(Pt IVC by friend. ) Petitioner: Other(Friend: Bonnye Fava ) Is patient capable of signing voluntary admission?: Yes Referral Source: Self/Family/Friend Insurance type: Self Pay   Medical Screening Exam [facility] Walk-in ONLY) Medical Exam completed: Yes  Crisis Care Plan Living Arrangements: Parent, Other relatives(Mother and Sister) Legal Guardian: Other:(Self ) Name of Psychiatrist: Denied.  Name of Therapist: Denied.    Education Status Is patient currently in school?: No Is the patient employed, unemployed or receiving disability?: Employed  Risk to self with the past 6 months Suicidal Ideation: Yes-Currently Present Has patient been a risk to self within the past 6 months prior to admission? : Yes Suicidal Intent: Yes-Currently Present Has patient had any suicidal intent within the past 6 months prior to admission? : Yes Is patient at risk for suicide?: Yes Suicidal Plan?: Yes-Currently Present Has patient had any suicidal plan within the past 6 months prior to admission? : Yes Specify Current Suicidal Plan: Overdose Access to Means: Yes Specify Access to Suicidal Means: Over the counter medications.  What has been your use of drugs/alcohol within the last 12 months?: Pt reports marijuana use.  Previous Attempts/Gestures: No How many times?: 0 Other Self Harm Risks: Pt denied.  Triggers for Past Attempts: None known(Pt denied. ) Intentional Self Injurious Behavior: None Family Suicide History: No Recent stressful life event(s): Other (Comment)(Pt did not identify any. ) Persecutory voices/beliefs?: No Depression: Yes Depression Symptoms: Feeling angry/irritable, Feeling worthless/self pity, Loss of interest in usual pleasures, Fatigue,  Isolating, Tearfulness, Despondent(Hypersomnia) Substance abuse history and/or treatment for substance abuse?: No Suicide prevention information given to non-admitted patients: Not applicable  Risk to Others within the past 6 months Homicidal Ideation: No Does patient have any lifetime risk of violence toward others beyond the six months prior to admission? : No Thoughts of Harm to Others: No Current Homicidal Intent: No Current Homicidal Plan: No Access to Homicidal Means: No Identified Victim: Pt denied.  History of harm to others?: No Assessment of Violence: None Noted Violent Behavior Description: N/A Does patient have access to weapons?: No Criminal  Charges Pending?: No Does patient have a court date: No Is patient on probation?: No  Psychosis Hallucinations: None noted Delusions: None noted  Mental Status Report Appearance/Hygiene: In scrubs, Unremarkable Eye Contact: Fair Motor Activity: Unremarkable Speech: Argumentative, Logical/coherent Level of Consciousness: Quiet/awake Mood: Depressed, Ashamed/humiliated, Sad Affect: Irritable, Depressed Anxiety Level: None Thought Processes: Coherent, Relevant Judgement: Impaired Orientation: Person, Place, Time, Situation, Appropriate for developmental age Obsessive Compulsive Thoughts/Behaviors: None  Cognitive Functioning Concentration: Normal Memory: Recent Intact, Remote Intact Is patient IDD: No Insight: Poor Impulse Control: Poor Appetite: Poor Have you had any weight changes? : No Change Sleep: Increased Total Hours of Sleep: 10 Vegetative Symptoms: Staying in bed  ADLScreening [facility] Assessment Services) Patient's cognitive ability adequate to safely complete daily activities?: Yes Patient able to express need for assistance with ADLs?: Yes Independently performs ADLs?: Yes (appropriate for developmental age)  Prior Inpatient Therapy Prior Inpatient Therapy: Yes Prior Therapy Dates: Several years ago.  Prior Therapy Facilty/Provider(s): Pt stated [facility].  Reason for Treatment: Depression  Prior Outpatient Therapy Prior Outpatient Therapy: Yes Prior Therapy Dates: 2018 Prior Therapy Facilty/Provider(s): [facility]; Janina Mayo Reason for Treatment: Depression  Does patient have an ACCT team?: No Does patient have Intensive In-House Services?  : No Does patient have [facility] services? : No Does patient have P4CC services?: No  ADL Screening (condition at time of admission) Patient's cognitive ability adequate to safely complete daily activities?: Yes Is the patient deaf or have difficulty hearing?: No Does the patient have difficulty seeing,  even when wearing glasses/contacts?: No Does the patient have difficulty concentrating, remembering, or making decisions?: No Patient able to express need for assistance with ADLs?: Yes Does the patient have difficulty dressing or bathing?: No Independently performs ADLs?: Yes (appropriate for developmental age) Does the patient have difficulty walking or climbing stairs?: No Weakness of Legs: None Weakness of Arms/Hands: None  Home Assistive Devices/Equipment Home Assistive Devices/Equipment: None  Therapy Consults (therapy consults require a physician order) PT Evaluation Needed: No OT Evalulation Needed: No SLP Evaluation Needed: No Abuse/Neglect Assessment (Assessment to be complete while patient is alone) Abuse/Neglect Assessment Can Be Completed: Yes Physical Abuse: Denies Verbal Abuse: Denies Sexual Abuse: Denies Exploitation of patient/patient's resources: Denies Self-Neglect: Denies Values / Beliefs Cultural Requests During Hospitalization: None Spiritual Requests During Hospitalization: None Consults Spiritual Care Consult Needed: No Social Work Consult Needed: No Merchant navy officer (For Healthcare) Does Patient Have a Medical Advance Directive?: No Would patient like information on creating a medical advance directive?: No - Patient declined          Disposition: Per Donell Sievert, PA; Pt meets inpatient critieria.  Disposition Initial Assessment Completed for this Encounter: Yes  Chesley Noon, M.S., [facility], LCAS Triage Specialist [facility]  08/22/2018 1:43 AM

## 2018-08-22 NOTE — ED Notes (Signed)
Pt refused offer of medication to help her calm down emotionally. Report called to [facility] at [facility] and [employer] called for transport.

## 2018-08-22 NOTE — ED Provider Notes (Signed)
[facility]-EMERGENCY DEPT Provider Note   CSN: [id number] Arrival date & time: 08/21/18  2350     History   Chief Complaint Chief Complaint  Patient presents with  . Suicidal    HPI Rebekah Rhodes is a 22 y.o. female.  Patient brought to the emergency department by [facility] Department after involuntary commitment was initiated by her friend.  Patient reportedly texted her friend today and told her that she overdosed on pills.  At arrival to the emergency department, patient now states that she only said she took the pills, did not overdose.  She does, however, admit to being depressed and suicidal.     Past Medical History:  Diagnosis Date  . Anxiety   . Asthma   . Chlamydia   . Depression    was rx meds, has not picked them up  . Eczema   . Genital herpes   . HA (headache)   . Infection    UTI    Patient Active Problem List   Diagnosis Date Noted  . Bacterial vaginitis 05/16/2018  . Vaginal irritation 05/16/2018  . Cutaneous candidiasis 05/16/2018  . 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 Migraine 03/10/2014  . HA (headache)     Past Surgical History:  Procedure Laterality Date  . NO PAST SURGERIES    . None    . WISDOM TOOTH EXTRACTION       OB History    Gravida  0   Para  0   Term  0   Preterm  0   AB  0   Living  0     SAB  0   TAB  0   Ectopic  0   Multiple  0   Live Births               Home Medications    Prior to Admission medications   Medication Sig Start Date End Date Taking? Authorizing Provider  acyclovir (ZOVIRAX) 400 MG tablet Take 1 tablet (400 mg total) by mouth 3 (three) times daily. Patient not taking: Reported on 08/07/2018 05/16/18   Raelyn Mora, CNM  albuterol (PROVENTIL HFA;VENTOLIN HFA) 108 [phone] Base) MCG/ACT inhaler Inhale 2 puffs into the lungs every 6 (six) hours as needed for wheezing or shortness of breath. 11/28/17   Aviva Kluver B, PA-C    fluconazole (DIFLUCAN) 150 MG tablet Take 1 tablet (150 mg total) by mouth daily. Patient not taking: Reported on 08/07/2018 06/20/18   Judeth Horn, NP  metroNIDAZOLE (FLAGYL) 500 MG tablet Take 1 tablet (500 mg total) by mouth 2 (two) times daily. Patient not taking: Reported on 08/07/2018 06/20/18   Judeth Horn, NP  nystatin-triamcinolone [facility] II) cream Apply to affected area daily Patient not taking: Reported on 08/07/2018 05/16/18   Raelyn Mora, CNM    Family History Family History  Problem Relation Age of Onset  . Breast cancer Mother   . Cancer Maternal Grandmother        breast; also great grandma    Social History Social History   Tobacco Use  . Smoking status: Former Smoker    Packs/day: 0.25    Types: Cigarettes  . Smokeless tobacco: Never Used  Substance Use Topics  . Alcohol use: No  . Drug use: Not Currently    Frequency: 7.0 times per week    Types: Marijuana     Allergies   Amoxicillin and Penicillins   Review of Systems Review of  Systems  Psychiatric/Behavioral: Positive for dysphoric mood and suicidal ideas.  All other systems reviewed and are negative.    Physical Exam Updated Vital Signs BP (!) 133/94 (BP Location: Left Arm)   Pulse 81   Temp 99.2 F (37.3 C) (Oral)   Resp 18   Ht 5\' 7"  (1.702 m)   Wt 52.6 kg   SpO2 100%   BMI 18.17 kg/m   Physical Exam  Constitutional: She is oriented to person, place, and time. She appears well-developed and well-nourished. No distress.  HENT:  Head: Normocephalic and atraumatic.  Right Ear: Hearing normal.  Left Ear: Hearing normal.  Nose: Nose normal.  Mouth/Throat: Oropharynx is clear and moist and mucous membranes are normal.  Eyes: Pupils are equal, round, and reactive to light. Conjunctivae and EOM are normal.  Neck: Normal range of motion. Neck supple.  Cardiovascular: Regular rhythm, S1 normal and S2 normal. Exam reveals no gallop and no friction rub.  No murmur  heard. Pulmonary/Chest: Effort normal and breath sounds normal. No respiratory distress. She exhibits no tenderness.  Abdominal: Soft. Normal appearance and bowel sounds are normal. There is no hepatosplenomegaly. There is no tenderness. There is no rebound, no guarding, no tenderness at McBurney's point and negative Murphy's sign. No hernia.  Musculoskeletal: Normal range of motion.  Neurological: She is alert and oriented to person, place, and time. She has normal strength. No cranial nerve deficit or sensory deficit. Coordination normal. GCS eye subscore is 4. GCS verbal subscore is 5. GCS motor subscore is 6.  Skin: Skin is warm, dry and intact. No rash noted. No cyanosis.  Psychiatric: Her mood appears anxious. She is slowed and withdrawn. She exhibits a depressed mood. She expresses suicidal ideation. She is noncommunicative.  Nursing note and vitals reviewed.    ED Treatments / Results  Labs (all labs ordered are listed, but only abnormal results are displayed) Labs Reviewed  COMPREHENSIVE METABOLIC PANEL  ETHANOL  SALICYLATE LEVEL  ACETAMINOPHEN LEVEL  CBC  RAPID URINE DRUG SCREEN, HOSP PERFORMED  PREGNANCY, URINE    EKG None  Radiology No results found.  Procedures Procedures (including critical care time)  Medications Ordered in ED Medications  risperiDONE (RISPERDAL M-TABS) disintegrating tablet 2 mg (has no administration in time range)    And  LORazepam (ATIVAN) tablet 1 mg (has no administration in time range)    And  ziprasidone (GEODON) injection 20 mg (has no administration in time range)  acetaminophen (TYLENOL) tablet 650 mg (has no administration in time range)  zolpidem (AMBIEN) tablet 5 mg (has no administration in time range)  ondansetron (ZOFRAN) tablet 4 mg (has no administration in time range)     Initial Impression / Assessment and Plan / ED Course  I have reviewed the triage vital signs and the nursing notes.  Pertinent labs & imaging  results that were available during my care of the patient were reviewed by me and considered in my medical decision making (see chart for details).     Patient with depression, untreated now with suicidal ideation.  Will require psychiatric evaluation.  Final Clinical Impressions(s) / ED Diagnoses   Final diagnoses:  Depression, unspecified depression type    ED Discharge Orders    None       Gilda Crease, MD 08/22/18 [phone]

## 2018-08-22 NOTE — H&P (Signed)
Psychiatric Admission Assessment Adult  Patient Identification: Rebekah Rhodes MRN:  [id number] Date of Evaluation:  08/22/2018 Chief Complaint:  MDD RECURRENT SEVERE CANNABIS USE Principal Diagnosis: <principal problem not specified> Diagnosis:   Patient Active Problem List   Diagnosis Date Noted  . MDD (major depressive disorder), recurrent episode, severe ([place]) [F33.2] 08/22/2018  . Bacterial vaginitis [N76.0, B96.89] 05/16/2018  . Vaginal irritation [N89.8] 05/16/2018  . Cutaneous candidiasis [B37.2] 05/16/2018  . 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 Migraine [G43.909] 03/10/2014  . HA (headache) [R51]    History of Present Illness: Patient is seen and examined.  Patient is a 22 year old female with a past psychiatric history significant for major depression who presented to the [facility] emergency department on 08/22/2018 under involuntary commitment.  According to the notes the patient had been placed under involuntary commitment by a friend.  The friend stated that the patient had said she was going to kill her self.  The patient had apparently stated that she wanted to die on several occasions.  The friend contacted the patient's mother, but the mother declined to intervene.  The friend decided at that time to involuntarily commit her.  Patient has been treated for depression in the past.  She is been treated by her primary care provider.  She has had periods of time where she took medication, but was primarily noncompliant.  She had previously been prescribed fluoxetine, sertraline as well as S-Citalopram.  She has been attempting to get into psychotherapy for some time, but does not have insurance.  She feels like her mother is hateful, and does not like her.  She has to live with her right now because she does not have the finances to be able to live independently.  She sees her father is being supported.  He was in the emergency room last  night.  The patient denied any history of depressive symptoms that were continuous.  She stated that she would get depressed for occasional days.  I discussed with her her history from the electronic medical record, but she does not admit to having that serious of an illness.  She currently denies suicidal ideation.  She is tearful, withdrawn, and appears depressed.  She was admitted to the hospital for evaluation and stabilization.  Associated Signs/Symptoms: Depression Symptoms:  depressed mood, anhedonia, psychomotor retardation, fatigue, feelings of worthlessness/guilt, difficulty concentrating, hopelessness, suicidal thoughts without plan, anxiety, loss of energy/fatigue, (Hypo) Manic Symptoms:  Impulsivity, Anxiety Symptoms:  Excessive Worry, Psychotic Symptoms:  Denied PTSD Symptoms: Negative Total Time spent with patient: 30 minutes  Past Psychiatric History: Patient has never seen a psychiatrist in the past.  She is been treated with Lexapro, Zoloft and Prozac in the past by her primary care provider.  She has been in therapy, but is unable to continue that because of financial reasons.  No previous psychiatric admissions.  Is the patient at risk to self? No.  Has the patient been a risk to self in the past 6 months? No.  Has the patient been a risk to self within the distant past? No.  Is the patient a risk to others? No.  Has the patient been a risk to others in the past 6 months? No.  Has the patient been a risk to others within the distant past? No.   Prior Inpatient Therapy:   Prior Outpatient Therapy:    Alcohol Screening:   Substance Abuse History in the last 12 months:  Yes.   Consequences of Substance Abuse: Negative Previous Psychotropic Medications: Yes  Psychological Evaluations: No  Past Medical History:  Past Medical History:  Diagnosis Date  . Anxiety   . Asthma   . Chlamydia   . Depression    was rx meds, has not picked them up  . Eczema   .  Genital herpes   . HA (headache)   . Infection    UTI    Past Surgical History:  Procedure Laterality Date  . NO PAST SURGERIES    . None    . WISDOM TOOTH EXTRACTION     Family History:  Family History  Problem Relation Age of Onset  . Breast cancer Mother   . Cancer Maternal Grandmother        breast; also great grandma   Family Psychiatric  History: Patient denied any family history of psychiatric illness Tobacco Screening:   Social History:  Social History   Substance and Sexual Activity  Alcohol Use No     Social History   Substance and Sexual Activity  Drug Use Not Currently  . Frequency: 7.0 times per week  . Types: Marijuana    Additional Social History:                           Allergies:   Allergies  Allergen Reactions  . Amoxicillin Hives, Diarrhea and Nausea And Vomiting    Childhood allergy Has patient had a PCN reaction causing immediate rash, facial/tongue/throat swelling, SOB or lightheadedness with hypotension: Yes Has patient had a PCN reaction causing severe rash involving mucus membranes or skin necrosis: No Has patient had a PCN reaction that required hospitalization: No Has patient had a PCN reaction occurring within the last 10 years: No If all of the above answers are "NO", then may proceed with Cephalosporin use.  Marland Kitchen Penicillins Hives, Diarrhea and Nausea And Vomiting    Has patient had a PCN reaction causing immediate rash, facial/tongue/throat swelling, SOB or lightheadedness with hypotension: Yes Has patient had a PCN reaction causing severe rash involving mucus membranes or skin necrosis: No Has patient had a PCN reaction that required hospitalization No Has patient had a PCN reaction occurring within the last 10 years: No If all of the above answers are "NO", then may proceed with Cephalosporin   Lab Results:  Results for orders placed or performed during the hospital encounter of 08/21/18 (from the past 48 hour(s))   Rapid urine drug screen (hospital performed)     Status: Abnormal   Collection Time: 08/22/18 12:45 AM  Result Value Ref Range   Opiates NONE DETECTED NONE DETECTED   Cocaine NONE DETECTED NONE DETECTED   Benzodiazepines NONE DETECTED NONE DETECTED   Amphetamines NONE DETECTED NONE DETECTED   Tetrahydrocannabinol POSITIVE (A) NONE DETECTED   Barbiturates NONE DETECTED NONE DETECTED    Comment: (NOTE) DRUG SCREEN FOR MEDICAL PURPOSES ONLY.  IF CONFIRMATION IS NEEDED FOR ANY PURPOSE, NOTIFY LAB WITHIN 5 DAYS. LOWEST DETECTABLE LIMITS FOR URINE DRUG SCREEN Drug Class                     Cutoff (ng/mL) Amphetamine and metabolites    1000 Barbiturate and metabolites    200 Benzodiazepine                 027 Tricyclics and metabolites     300 Opiates and metabolites        300  Cocaine and metabolites        300 THC                            50 Performed at [facility], [place] [address] [place], [place] [address]   Pregnancy, urine     Status: None   Collection Time: 08/22/18 12:45 AM  Result Value Ref Range   Preg Test, Ur NEGATIVE NEGATIVE    Comment:        THE SENSITIVITY OF THIS METHODOLOGY IS >20 mIU/mL. Performed at [facility], [place] [address] [place], [place] [address]   Comprehensive metabolic panel     Status: None   Collection Time: 08/22/18  1:03 AM  Result Value Ref Range   Sodium 139 135 - 145 mmol/L   Potassium 3.5 3.5 - 5.1 mmol/L   Chloride 108 98 - 111 mmol/L   CO2 24 22 - 32 mmol/L   Glucose, Bld 88 70 - 99 mg/dL   BUN 14 6 - 20 mg/dL   Creatinine, Ser 0.58 0.44 - 1.00 mg/dL   Calcium 9.1 8.9 - 10.3 mg/dL   Total Protein 7.6 6.5 - 8.1 g/dL   Albumin 4.7 3.5 - 5.0 g/dL   AST 19 15 - 41 U/L   ALT 11 0 - 44 U/L   Alkaline Phosphatase 50 38 - 126 U/L   Total Bilirubin 0.7 0.3 - 1.2 mg/dL   GFR calc non Af Amer >60 >60 mL/min   GFR calc Af Amer >60 >60 mL/min    Comment: (NOTE) The eGFR has been calculated using  the CKD EPI equation. This calculation has not been validated in all clinical situations. eGFR's persistently <60 mL/min signify possible Chronic Kidney Disease.    Anion gap 7 5 - 15    Comment: Performed at [facility], [place] [address] [place], [place] [address]  Ethanol     Status: None   Collection Time: 08/22/18  1:03 AM  Result Value Ref Range   Alcohol, Ethyl (B) <10 <10 mg/dL    Comment: (NOTE) Lowest detectable limit for serum alcohol is 10 mg/dL. For medical purposes only. Performed at [facility], [place] [address] [place], [place] [address]   Salicylate level     Status: None   Collection Time: 08/22/18  1:03 AM  Result Value Ref Range   Salicylate Lvl <0.2 2.8 - 30.0 mg/dL    Comment: Performed at [facility], [place] [address] [place], [place] [address]  Acetaminophen level     Status: Abnormal   Collection Time: 08/22/18  1:03 AM  Result Value Ref Range   Acetaminophen (Tylenol), Serum <10 (L) 10 - 30 ug/mL    Comment: (NOTE) Therapeutic concentrations vary significantly. A range of 10-30 ug/mL  may be an effective concentration for many patients. However, some  are best treated at concentrations outside of this range. Acetaminophen concentrations >150 ug/mL at 4 hours after ingestion  and >50 ug/mL at 12 hours after ingestion are often associated with  toxic reactions. Performed at [facility], [place] [address] [place], [place] [address]   cbc     Status: Abnormal   Collection Time: 08/22/18  1:03 AM  Result Value Ref Range   WBC 5.1 4.0 - 10.5 K/uL   RBC 4.43 3.87 - 5.11 MIL/uL   Hemoglobin 11.9 (L) 12.0 - 15.0 g/dL   HCT  37.4 36.0 - 46.0 %   MCV 84.4 80.0 - 100.0 fL   MCH 26.9 26.0 - 34.0 pg   MCHC 31.8 30.0 - 36.0 g/dL   RDW 12.9 11.5 - 15.5 %   Platelets 221 150 - 400 K/uL   nRBC 0.0 0.0 - 0.2 %    Comment: Performed at [facility], [place] [address]  [place], [place] [address]    Blood Alcohol level:  Lab Results  Component Value Date   ETH <10 08/22/2018   ETH <5 44/12/4740    Metabolic Disorder Labs:  No results found for: HGBA1C, MPG No results found for: PROLACTIN No results found for: CHOL, TRIG, HDL, CHOLHDL, VLDL, LDLCALC  Current Medications: Current Facility-Administered Medications  Medication Dose Route Frequency Provider Last Rate Last Dose  . acetaminophen (TYLENOL) tablet 650 mg  650 mg Oral Q6H PRN Laverle Hobby, PA-C      . alum & mag hydroxide-simeth (MAALOX/MYLANTA) 200-200-20 MG/5ML suspension 30 mL  30 mL Oral Q4H PRN Patriciaann Clan E, PA-C      . hydrOXYzine (ATARAX/VISTARIL) tablet 10 mg  10 mg Oral Q6H PRN Sharma Covert, MD      . magnesium hydroxide (MILK OF MAGNESIA) suspension 30 mL  30 mL Oral Daily PRN Laverle Hobby, PA-C      . traZODone (DESYREL) tablet 25 mg  25 mg Oral QHS,MR X 1 , Cordie Grice, MD      . venlafaxine XR (EFFEXOR-XR) 24 hr capsule 37.5 mg  37.5 mg Oral Q breakfast Sharma Covert, MD       PTA Medications: Medications Prior to Admission  Medication Sig Dispense Refill Last Dose  . acyclovir (ZOVIRAX) 400 MG tablet Take 1 tablet (400 mg total) by mouth 3 (three) times daily. (Patient not taking: Reported on 08/07/2018) 15 tablet 2 Completed Course at Unknown time  . albuterol (PROVENTIL HFA;VENTOLIN HFA) 108 (90 Base) MCG/ACT inhaler Inhale 2 puffs into the lungs every 6 (six) hours as needed for wheezing or shortness of breath. 1 Inhaler 2 unknown  . fluconazole (DIFLUCAN) 150 MG tablet Take 1 tablet (150 mg total) by mouth daily. (Patient not taking: Reported on 08/07/2018) 1 tablet 1 Completed Course at Unknown time  . metroNIDAZOLE (FLAGYL) 500 MG tablet Take 1 tablet (500 mg total) by mouth 2 (two) times daily. (Patient not taking: Reported on 08/07/2018) 14 tablet 0 Completed Course at Unknown time  . nystatin-triamcinolone (MYCOLOG II) cream Apply to affected area  daily (Patient not taking: Reported on 08/07/2018) 15 g 0 Completed Course at Unknown time    Musculoskeletal: Strength & Muscle Tone: within normal limits Gait & Station: normal Patient leans: N/A  Psychiatric Specialty Exam: Physical Exam  Nursing note and vitals reviewed. Constitutional: She is oriented to person, place, and time. She appears well-developed and well-nourished.  HENT:  Head: Normocephalic and atraumatic.  Respiratory: Effort normal.  Neurological: She is alert and oriented to person, place, and time.    ROS  There were no vitals taken for this visit.There is no height or weight on file to calculate BMI.  General Appearance: Disheveled  Eye Contact:  Poor  Speech:  Normal Rate  Volume:  Decreased  Mood:  Depressed  Affect:  Congruent  Thought Process:  Coherent and Descriptions of Associations: Circumstantial  Orientation:  Full (Time, Place, and Person)  Thought Content:  Logical  Suicidal Thoughts:  No  Homicidal Thoughts:  No  Memory:  Immediate;  Fair Recent;   Fair Remote;   Fair  Judgement:  Impaired  Insight:  Lacking  Psychomotor Activity:  Psychomotor Retardation  Concentration:  Concentration: Fair and Attention Span: Fair  Recall:  [employer] of Knowledge:  Fair  Language:  Fair  Akathisia:  Negative  Handed:  Right  AIMS (if indicated):     Assets:  Desire for Improvement Housing Physical Health Resilience  ADL's:  Intact  Cognition:  WNL  Sleep:       Treatment Plan Summary: Daily contact with patient to assess and evaluate symptoms and progress in treatment, Medication management and Plan : Patient is seen and examined.  Patient is a 22 year old female with the above-stated past psychiatric history who was admitted to the behavioral health hospital after being placed under involuntary commitment to the [facility] emergency department.  She will be admitted to the inpatient unit.  She will be integrated into the  milieu.  She will be encouraged to attend groups.  She will meet with social work.  I have discussed with her a trial of Effexor XR but she does not want to take medications at this point.  We will collect collateral information from her father.  Hopefully we can be of benefit to her.  Observation Level/Precautions:  15 minute checks  Laboratory:  Chemistry Profile  Psychotherapy:    Medications:    Consultations:    Discharge Concerns:    Estimated LOS:  Other:     Physician Treatment Plan for Primary Diagnosis: <principal problem not specified> Long Term Goal(s): Improvement in symptoms so as ready for discharge  Short Term Goals: Ability to identify changes in lifestyle to reduce recurrence of condition will improve, Ability to verbalize feelings will improve, Ability to disclose and discuss suicidal ideas, Ability to demonstrate self-control will improve, Ability to identify and develop effective coping behaviors will improve and Ability to maintain clinical measurements within normal limits will improve  Physician Treatment Plan for Secondary Diagnosis: Active Problems:   MDD (major depressive disorder), recurrent episode, severe ([place])  Long Term Goal(s): Improvement in symptoms so as ready for discharge  Short Term Goals: Ability to identify changes in lifestyle to reduce recurrence of condition will improve, Ability to verbalize feelings will improve, Ability to disclose and discuss suicidal ideas, Ability to demonstrate self-control will improve, Ability to identify and develop effective coping behaviors will improve and Ability to maintain clinical measurements within normal limits will improve  I certify that inpatient services furnished can reasonably be expected to improve the patient's condition.    Sharma Covert, MD 10/23/201912:16 PM

## 2018-08-22 NOTE — ED Notes (Addendum)
Pt labile and tearful that she is being admitted to a hospital. She does not have the insight to understand the IVC process and believes that her rights are being violated by being under IVC. Pt denies SI and

## 2018-08-22 NOTE — Tx Team (Signed)
Initial Treatment Plan 08/22/2018 3:16 PM [name] ZOX:[id number]    PATIENT STRESSORS: Marital or family conflict   PATIENT STRENGTHS: Average or above average intelligence Psychologist, counselling means Physical Health Supportive family/friends Work skills   PATIENT IDENTIFIED PROBLEMS: "How to deal with my depression"  "How to balance everything"  Conflict with mother is a trigger  Ineffective coping skills               DISCHARGE CRITERIA:  Adequate post-discharge living arrangements Motivation to continue treatment in a less acute level of care Need for constant or close observation no longer present Verbal commitment to aftercare and medication compliance  PRELIMINARY DISCHARGE PLAN: Outpatient therapy Return to previous living arrangement Return to previous work or school arrangements  PATIENT/FAMILY INVOLVEMENT: This treatment plan has been presented to and reviewed with the patient, Rebekah Rhodes.  The patient and family have been given the opportunity to ask questions and make suggestions.  Cranford Mon, RN 08/22/2018, 3:16 PM

## 2018-08-22 NOTE — BHH Suicide Risk Assessment (Signed)
[name] Admission Suicide Risk Assessment   Nursing information obtained from:    Demographic factors:    Current Mental Status:    Loss Factors:    Historical Factors:    Risk Reduction Factors:     Total Time spent with Rhodes: 20 minutes Principal Problem: <principal problem not specified> Diagnosis:   Rhodes Active Problem List   Diagnosis Date Noted  . MDD (major depressive disorder), recurrent episode, severe (HCC) [F33.2] 08/22/2018  . Bacterial vaginitis [N76.0, B96.89] 05/16/2018  . Vaginal irritation [N89.8] 05/16/2018  . Cutaneous candidiasis [B37.2] 05/16/2018  . 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 Migraine [G43.909] 03/10/2014  . HA (headache) [R51]    Subjective Data: Rhodes is seen and examined.  Rhodes is a 22 year old female with a past psychiatric history significant for major depression who presented to the [facility] emergency department on 08/22/2018 under involuntary commitment.  According to the notes the Rhodes had been placed under involuntary commitment by a friend.  The friend stated that the Rhodes had said she was going to kill her self.  The Rhodes had apparently stated that she wanted to die on several occasions.  The friend contacted the Rhodes's mother, but the mother declined to intervene.  The friend decided at that time to involuntarily commit her.  Rhodes has been treated for depression in the past.  She is been treated by her primary care provider.  She has had periods of time where she took medication, but was primarily noncompliant.  She had previously been prescribed fluoxetine, sertraline as well as S-Citalopram.  She has been attempting to get into psychotherapy for some time, but does not have insurance.  She feels like her mother is hateful, and does not like her.  She has to live with her right now because she does not have the finances to be able to live independently.  She sees her father is  being supported.  He was in the emergency room last night.  The Rhodes denied any history of depressive symptoms that were continuous.  She stated that she would get depressed for occasional days.  I discussed with her her history from the electronic medical record, but she does not admit to having that serious of an illness.  She currently denies suicidal ideation.  She is tearful, withdrawn, and appears depressed.  She was admitted to the hospital for evaluation and stabilization.  Continued Clinical Symptoms:    The "Alcohol Use Disorders Identification Test", Guidelines for Use in Primary Care, Second Edition.  World Science writer [facility]). Score between 0-7:  no or low risk or alcohol related problems. Score between 8-15:  moderate risk of alcohol related problems. Score between 16-19:  high risk of alcohol related problems. Score 20 or above:  warrants further diagnostic evaluation for alcohol dependence and treatment.   CLINICAL FACTORS:   Depression:   Anhedonia Comorbid alcohol abuse/dependence Hopelessness Impulsivity Insomnia Alcohol/Substance Abuse/Dependencies   Musculoskeletal: Strength & Muscle Tone: within normal limits Gait & Station: normal Rhodes leans: N/A  Psychiatric Specialty Exam: Physical Exam  Nursing note and vitals reviewed. Constitutional: She is oriented to person, place, and time. She appears well-developed and well-nourished.  HENT:  Head: Normocephalic and atraumatic.  Respiratory: Effort normal.  Neurological: She is alert and oriented to person, place, and time.    ROS  There were no vitals taken for this visit.There is no height or weight on file to calculate BMI.  General Appearance: Disheveled  Eye Contact:  Poor  Speech:  Normal Rate  Volume:  Decreased  Mood:  Depressed  Affect:  Congruent  Thought Process:  Coherent and Descriptions of Associations: Circumstantial  Orientation:  Full (Time, Place, and Person)  Thought Content:   Logical  Suicidal Thoughts:  No  Homicidal Thoughts:  No  Memory:  Immediate;   Fair Recent;   Fair Remote;   Fair  Judgement:  Impaired  Insight:  Lacking  Psychomotor Activity:  Decreased  Concentration:  Concentration: Fair and Attention Span: Fair  Recall:  [employer] of Knowledge:  Fair  Language:  Fair  Akathisia:  Negative  Handed:  Right  AIMS (if indicated):     Assets:  Desire for Improvement Physical Health Resilience Social Support  ADL's:  Intact  Cognition:  WNL  Sleep:         COGNITIVE FEATURES THAT CONTRIBUTE TO RISK:  None    SUICIDE RISK:   Mild:  Suicidal ideation of limited frequency, intensity, duration, and specificity.  There are no identifiable plans, no associated intent, mild dysphoria and related symptoms, good self-control (both objective and subjective assessment), few other risk factors, and identifiable protective factors, including available and accessible social support.  PLAN OF CARE: Rhodes is seen and examined.  Rhodes is a 22 year old female with the above-stated past psychiatric history who was admitted to the behavioral health hospital after being placed under involuntary commitment to the [facility] emergency department.  She will be admitted to the inpatient unit.  She will be integrated into the milieu.  She will be encouraged to attend groups.  She will meet with social work.  I have discussed with her a trial of Effexor XR but she does not want to take medications at this point.  We will collect collateral information from her father.  Hopefully we can be of benefit to her. I certify that inpatient services furnished can reasonably be expected to improve the Rhodes's condition.   Antonieta Pert, MD 08/22/2018, 12:09 PM

## 2018-08-22 NOTE — ED Notes (Signed)
TTS assessment in progress. 

## 2018-08-22 NOTE — Progress Notes (Signed)
Admission note:  Patient is a 22 yo female admitted to [facility] for depression and possible suicide attempt. This is her first psyche admission.  Per patient, she texted a friend and told her that she had taken some pills and overdosed.  Patient states, "I did not actually take the pills."  The friend became concerned and IVC'd her. Patient is anxious and upset over the fact that she has to stay.  She states, "they told me in the ED that I would only have to stay 3 days."  Informed patient that she is IVC'd and only a MD can make the determination how long her length of stay will be.  She denies any tobacco or alcohol use.  She states, "I smoke 2 blunts a week." Patient was positive for THC.  She states she was attending [employer] recently, however, she now works at a call center.  Her main support is her dad.  She lives in [place] with her mother and sister.  She states she argues with her mom frequently and that can be a trigger for her.  She denies any medications PTA and does not receive any type of counseling.  She would like to "work on my depression."  Her skin was unremarkable except for various tatoos and a belly ring.  She has not pertinent medical hx.  Patient has a implant in her left upper arm area for birth control.  She denies any thoughts of self harm.

## 2018-08-22 NOTE — ED Notes (Signed)
At the time of discharge pt was not cooperative with staff. She was hanging onto her father. Refused any interventions by this Clinical research associate.

## 2018-08-22 NOTE — ED Notes (Signed)
Attempted to call report; there was no answer.

## 2018-08-23 NOTE — BHH Suicide Risk Assessment (Signed)
BHH INPATIENT:  Family/Significant Other Suicide Prevention Education  Suicide Prevention Education:  Education Completed; Adalynd Donahoe, father [phone]) has been identified by the patient as the family member/significant other with whom the patient will be residing, and identified as the person(s) who will aid the patient in the event of a mental health crisis (suicidal ideations/suicide attempt).  With written consent from the patient, the family member/significant other has been provided the following suicide prevention education, prior to the and/or following the discharge of the patient.  The suicide prevention education provided includes the following:  Suicide risk factors  Suicide prevention and interventions  National Suicide Hotline telephone number  [facility] assessment telephone number  [facility] Emergency Assistance 911  [facility] and/or Residential Mobile Crisis Unit telephone number  Request made of family/significant other to:  Remove weapons (e.g., guns, rifles, knives), all items previously/currently identified as safety concern.    Remove drugs/medications (over-the-counter, prescriptions, illicit drugs), all items previously/currently identified as a safety concern.  The family member/significant other verbalizes understanding of the suicide prevention education information provided.  The family member/significant other agrees to remove the items of safety concern listed above.  Maeola Sarah 08/23/2018, 3:20 PM

## 2018-08-23 NOTE — Progress Notes (Signed)
Nursing Progress Note: 7p-7a D: Pt currently presents with a pleasant affect and behavior. Pt states "I need to change my admission status before I am discharged tomorrow to voluntary." Interacting appopriately with the milieu. Pt reports  good sleep during the previous night with current medication regimen.   A: Pt provided with medications per providers orders. Pt's labs and vitals were monitored throughout the night. Pt supported emotionally and encouraged to express concerns and questions. Pt educated on medications.  R: Pt's safety ensured with 15 minute and environmental checks. Pt currently denies SI, HI, and AVH. Pt verbally contracts to seek staff if SI,HI, or AVH occurs and to consult with staff before acting on any harmful thoughts. Will continue to monitor.

## 2018-08-23 NOTE — BHH Group Notes (Signed)
LCSW Group Therapy Note 08/23/2018 3:46 PM  Type of Therapy and Topic: Group Therapy: Avoiding Self-Sabotaging and Enabling Behaviors  Participation Level: Active  Description of Group:  In this group, patients will learn how to identify obstacles, self-sabotaging and enabling behaviors, as well as: what are they, why do we do them and what needs these behaviors meet. Discuss unhealthy relationships and how to have positive healthy boundaries with those that sabotage and enable. Explore aspects of self-sabotage and enabling in yourself and how to limit these self-destructive behaviors in everyday life.  Therapeutic Goals: 1. Patient will identify one obstacle that relates to self-sabotage and enabling behaviors 2. Patient will identify one personal self-sabotaging or enabling behavior they did prior to admission 3. Patient will state a plan to change the above identified behavior 4. Patient will demonstrate ability to communicate their needs through discussion and/or role play.   Summary of Patient Progress:  Rebekah Rhodes was engaged and participated throughout the group session. Rebekah Rhodes reports her self sabotaging behavior is hanging onto negative relationships. Rebekah Rhodes reports these relationships have triggered her depressive episodes.     Therapeutic Modalities:  Cognitive Behavioral Therapy Person-Centered Therapy Motivational Interviewing   Baldo Daub LCSWA Clinical Social Worker

## 2018-08-23 NOTE — BHH Counselor (Signed)
Adult Comprehensive Assessment  Patient ID: Rebekah Rhodes, female   DOB: 08-Jun-1996, 22 y.o.   MRN: [id number]  Information Source: Information source: Patient  Current Stressors:  Patient states their primary concerns and needs for treatment are:: "Depression" Patient states their goals for this hospitilization and ongoing recovery are:: "Learn how to cope" Educational / Learning stressors: Patient denies any stressors  Employment / Job issues: Employed; Patient reports her job is stressful  Family Relationships: Patient reports she and her mother have a strained relationship. She states that she and her mother do not get along.  Financial / Lack of resources (include bankruptcy): Patient reports she worries about finances when she does not go to work Housing / Lack of housing: Lives with mother and younger sister; Reports she and her mother do not get along  Physical health (include injuries & life threatening diseases): Patient reports she has been losing weight due to being depressed.  Social relationships: Patient reports her relationship with her boyfriend can be toxic, however he is supportive  Substance abuse: Patient denies any substance abuse  Bereavement / Loss: Patient denies any stressors   Living/Environment/Situation:  Living Arrangements: Parent Living conditions (as described by patient or guardian): "Good" Who else lives in the home?: Mother, younger sister  How long has patient lived in current situation?: 1 year  What is atmosphere in current home: Chaotic, Comfortable, Supportive  Family History:  Marital status: Single Are you sexually active?: Yes What is your sexual orientation?: Heterosexual  Has your sexual activity been affected by drugs, alcohol, medication, or emotional stress?: No  Does patient have children?: No  Childhood History:  By whom was/is the patient raised?: Grandparents, Mother Additional childhood history information: Patient reports her  primary caretaker during her childhood was her grandmother.  Description of patient's relationship with caregiver when they were a child: Patient reports having a great relationship with her grandmother as a child, however she had a strained relationship with her mother during her childhood.  Patient's description of current relationship with people who raised him/her: Patient reports her grandmother is currently deceased. Patient reports she continues to have a strained relationship with her mother How were you disciplined when you got in trouble as a child/adolescent?: Whoopings  Does patient have siblings?: Yes Number of Siblings: 6 Description of patient's current relationship with siblings: Patient reports she has a good relationship with her six siblings.  Did patient suffer any verbal/emotional/physical/sexual abuse as a child?: No Did patient suffer from severe childhood neglect?: No Has patient ever been sexually abused/assaulted/raped as an adolescent or adult?: No Was the patient ever a victim of a crime or a disaster?: No Witnessed domestic violence?: No Has patient been effected by domestic violence as an adult?: No  Education:  Highest grade of school patient has completed: 12th grade; Some college  Currently a student?: No Learning disability?: No  Employment/Work Situation:   Employment situation: Employed Where is patient currently employed?: [employer]  How long has patient been employed?: Since April 30, 2018  Patient's job has been impacted by current illness: No What is the longest time patient has a held a job?: 3 years  Where was the patient employed at that time?: [employer]  Did You Receive Any Psychiatric Treatment/Services While in Equities trader?: No Are There Guns or Other Weapons in Your Home?: No  Financial Resources:   Surveyor, quantity resources: Income from employment Does patient have a representative payee or guardian?: No  Alcohol/Substance Abuse:  What has been your use of drugs/alcohol within the last 12 months?: Patient denies  If attempted suicide, did drugs/alcohol play a role in this?: No Alcohol/Substance Abuse Treatment Hx: Denies past history Has alcohol/substance abuse ever caused legal problems?: No  Social Support System:   Patient's Community Support System: Good Describe Community Support System: "My dad" Type of faith/religion: Spritual  How does patient's faith help to cope with current illness?: Prayer   Leisure/Recreation:   Leisure and Hobbies: Reading, hanging with friends  Strengths/Needs:   What is the patient's perception of their strengths?: "I have a good heart, I'm determined, I like being challenged" Patient states they can use these personal strengths during their treatment to contribute to their recovery: Yes  Patient states these barriers may affect/interfere with their treatment: No  Patient states these barriers may affect their return to the community: No Other important information patient would like considered in planning for their treatment: No   Discharge Plan:   Currently receiving community mental health services: No Patient states concerns and preferences for aftercare planning are: Outpatient medication management and therapy services Patient states they will know when they are safe and ready for discharge when: Decrease in symptoms  Does patient have access to transportation?: Yes Does patient have financial barriers related to discharge medications?: Yes Patient description of barriers related to discharge medications: No insurance, low income  Plan for living situation after discharge: Patient reports she is moving in with her father at discharge.  Will patient be returning to same living situation after discharge?: No  Summary/Recommendations:   Summary and Recommendations (to be completed by the evaluator): Rebekah Rhodes is a 22 year old female who is diagnosed with Major depressive  disorder, Recurrent episode, Severe. She presented to the hospital under IVC by friend, seeking treatment for worsening depressive symtpoms. During the assessment, Rebekah Rhodes was pleasant and cooperatrive with providng information. Rebekah Rhodes states that her friend IVC'd her with bad intentions, because she and this friend were no longer close and that she had not shared any information with her. Bora reports that she and her boyfriend got into an argument and then shortly after, she and her mother got into an arguement. Carling reports that she beleives these two incidents triggered her depressive symtpoms. Jeilyn reports that she would like to learn better coping skills while in the hospital, in addition to outpatient referrals for medication management services. Leverne can benefit from crisis stabilization, medication management, therapeutic milieu and referral services.   Maeola Sarah. 08/23/2018

## 2018-08-23 NOTE — Plan of Care (Addendum)
Patient self inventory- Patient slept fair last night, sleep medication was not requested. Appetite has been fair, energy level normal, concentration good. Depression, anxiety, hopelessness all rated 0/10. Denies SI HI AVH. Denies physical problems. Patient's goal is to work on Pharmacologist. Patient refuses to take medications, writer told her it's okay, she can exercise that right as a patient. Safety is maintained with 15 minute checks. Will continue to monitor.  Problem: Education: Goal: Knowledge of [place] [facility] Education information/materials will improve Outcome: Progressing Goal: Mental status will improve Outcome: Progressing   Problem: Activity: Goal: Sleeping patterns will improve Outcome: Progressing   Problem: Coping: Goal: Ability to verbalize frustrations and anger appropriately will improve Outcome: Progressing   Problem: Education: Goal: Emotional status will improve Outcome: Not Progressing

## 2018-08-23 NOTE — Progress Notes (Signed)
[facility] MD Progress Note  08/23/2018 10:13 AM Rebekah Rhodes  MRN:  [id number] Subjective:  Patient is seen and examined. Patient is a 22 year old female with a past psychiatric history significant for major depression who presented to the [facility] emergency department on 08/22/2018 under involuntary commitment. According to the notes the patient had been placed under involuntary commitment by a friend. The friend stated that the patient had said she was going to kill her self. The patient had apparently stated that she wanted to die on several occasions. The friend contacted the patient's mother, but the mother declined to intervene. The friend decided at that time to involuntarily commit her. Patient has been treated for depression in the past. She is been treated by her primary care provider. She has had periods of time where she took medication, but was primarily noncompliant. She had previously been prescribed fluoxetine, sertraline as well as S-Citalopram. She has been attempting to get into psychotherapy for some time, but does not have insurance. She feels like her mother is hateful, and does not like her. She has to live with her right now because she does not have the finances to be able to live independently. She sees her father is being supported. He was in the emergency room last night. The patient denied any history of depressive symptoms that were continuous. She stated that she would get depressed for occasional days. I discussed with her her history from the electronic medical record, but she does not admit to having that serious of an illness. She currently denies suicidal ideation. She is tearful, withdrawn, and appears depressed. She was admitted to the hospital for evaluation and stabilization.  Objective: Patient is seen and examined.  Patient is a 22 year old female with a past psychiatric history significant for major depression and posttraumatic  stress disorder.  She is seen in follow-up.  She looks much better today.  We had a more relevant conversation about what is been going on.  She stated that the issues with her mother just complicate things.  She states she had a good conversation with her mother last night.  She also spoke with her father, and she is going to go stay either with her father or in a house that her father has available.  She denied any suicidal ideation.  She stated she took the Effexor yesterday, but it made her somewhat nauseated.  We discussed attempting it one more time before we stopped it.  She stated that previously her therapy helped a great deal, and she like to get back into therapy if at all possible.  No other side effects or suicidal ideation today.  Vital signs are stable, she is afebrile.  Nursing notes reflect that she slept 6.75 hours last night.  Principal Problem: <principal problem not specified> Diagnosis:   Patient Active Problem List   Diagnosis Date Noted  . MDD (major depressive disorder), recurrent episode, severe ([place]) [F33.2] 08/22/2018  . Bacterial vaginitis [N76.0, B96.89] 05/16/2018  . Vaginal irritation [N89.8] 05/16/2018  . Cutaneous candidiasis [B37.2] 05/16/2018  . 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 Migraine [G43.909] 03/10/2014  . HA (headache) [R51]    Total Time spent with patient: 15 minutes  Past Psychiatric History: See admission H&P  Past Medical History:  Past Medical History:  Diagnosis Date  . Anxiety   . Asthma   . Chlamydia   . Depression    was rx meds, has not picked them up  .  Eczema   . Genital herpes   . HA (headache)   . Infection    UTI    Past Surgical History:  Procedure Laterality Date  . NO PAST SURGERIES    . None    . WISDOM TOOTH EXTRACTION     Family History:  Family History  Problem Relation Age of Onset  . Breast cancer Mother   . Cancer Maternal Grandmother        breast; also great grandma   Family  Psychiatric  History: See admission H&P Social History:  Social History   Substance and Sexual Activity  Alcohol Use No     Social History   Substance and Sexual Activity  Drug Use Not Currently  . Frequency: 7.0 times per week  . Types: Marijuana    Social History   Socioeconomic History  . Marital status: Single    Spouse name: Not on file  . Number of children: 0  . Years of education: 14  . Highest education level: Not on file  Occupational History    Comment: High school student  Social Needs  . Financial resource strain: Not on file  . Food insecurity:    Worry: Not on file    Inability: Not on file  . Transportation needs:    Medical: Not on file    Non-medical: Not on file  Tobacco Use  . Smoking status: Former Smoker    Packs/day: 0.25    Types: Cigarettes  . Smokeless tobacco: Never Used  Substance and Sexual Activity  . Alcohol use: No  . Drug use: Not Currently    Frequency: 7.0 times per week    Types: Marijuana  . Sexual activity: Yes    Birth control/protection: Implant  Lifestyle  . Physical activity:    Days per week: Not on file    Minutes per session: Not on file  . Stress: Not on file  Relationships  . Social connections:    Talks on phone: Not on file    Gets together: Not on file    Attends religious service: Not on file    Active member of club or organization: Not on file    Attends meetings of clubs or organizations: Not on file    Relationship status: Not on file  Other Topics Concern  . Not on file  Social History Narrative   Patient lives at home with her father Rebekah Rhodes).   Patient is a high Retail buyer.   Right handed.   Caffeine soda one daily.   Additional Social History:                         Sleep: Good  Appetite:  Good  Current Medications: Current Facility-Administered Medications  Medication Dose Route Frequency Provider Last Rate Last Dose  . acetaminophen (TYLENOL) tablet 650 mg  650  mg Oral Q6H PRN Laverle Hobby, PA-C      . alum & mag hydroxide-simeth (MAALOX/MYLANTA) 200-200-20 MG/5ML suspension 30 mL  30 mL Oral Q4H PRN Patriciaann Clan E, PA-C      . hydrOXYzine (ATARAX/VISTARIL) tablet 10 mg  10 mg Oral Q6H PRN Sharma Covert, MD      . magnesium hydroxide (MILK OF MAGNESIA) suspension 30 mL  30 mL Oral Daily PRN Laverle Hobby, PA-C      . traZODone (DESYREL) tablet 25 mg  25 mg Oral QHS,MR X 1 Miqueas Whilden, Cordie Grice, MD      .  venlafaxine XR (EFFEXOR-XR) 24 hr capsule 37.5 mg  37.5 mg Oral Q breakfast Sharma Covert, MD   37.5 mg at 08/22/18 1308    Lab Results:  Results for orders placed or performed during the hospital encounter of 08/21/18 (from the past 48 hour(s))  Rapid urine drug screen (hospital performed)     Status: Abnormal   Collection Time: 08/22/18 12:45 AM  Result Value Ref Range   Opiates NONE DETECTED NONE DETECTED   Cocaine NONE DETECTED NONE DETECTED   Benzodiazepines NONE DETECTED NONE DETECTED   Amphetamines NONE DETECTED NONE DETECTED   Tetrahydrocannabinol POSITIVE (A) NONE DETECTED   Barbiturates NONE DETECTED NONE DETECTED    Comment: (NOTE) DRUG SCREEN FOR MEDICAL PURPOSES ONLY.  IF CONFIRMATION IS NEEDED FOR ANY PURPOSE, NOTIFY LAB WITHIN 5 DAYS. LOWEST DETECTABLE LIMITS FOR URINE DRUG SCREEN Drug Class                     Cutoff (ng/mL) Amphetamine and metabolites    1000 Barbiturate and metabolites    200 Benzodiazepine                 443 Tricyclics and metabolites     300 Opiates and metabolites        300 Cocaine and metabolites        300 THC                            50 Performed at [facility], [place] [address], [place], [place] [address]   Pregnancy, urine     Status: None   Collection Time: 08/22/18 12:45 AM  Result Value Ref Range   Preg Test, Ur NEGATIVE NEGATIVE    Comment:        THE SENSITIVITY OF THIS METHODOLOGY IS >20 mIU/mL. Performed at [facility], [place] [address], [place], [place] [address]   Comprehensive metabolic panel     Status: None   Collection Time: 08/22/18  1:03 AM  Result Value Ref Range   Sodium 139 135 - 145 mmol/L   Potassium 3.5 3.5 - 5.1 mmol/L   Chloride 108 98 - 111 mmol/L   CO2 24 22 - 32 mmol/L   Glucose, Bld 88 70 - 99 mg/dL   BUN 14 6 - 20 mg/dL   Creatinine, Ser 0.58 0.44 - 1.00 mg/dL   Calcium 9.1 8.9 - 10.3 mg/dL   Total Protein 7.6 6.5 - 8.1 g/dL   Albumin 4.7 3.5 - 5.0 g/dL   AST 19 15 - 41 U/L   ALT 11 0 - 44 U/L   Alkaline Phosphatase 50 38 - 126 U/L   Total Bilirubin 0.7 0.3 - 1.2 mg/dL   GFR calc non Af Amer >60 >60 mL/min   GFR calc Af Amer >60 >60 mL/min    Comment: (NOTE) The eGFR has been calculated using the CKD EPI equation. This calculation has not been validated in all clinical situations. eGFR's persistently <60 mL/min signify possible Chronic Kidney Disease.    Anion gap 7 5 - 15    Comment: Performed at [facility], [place] [address], [place], [place] [address]  Ethanol     Status: None   Collection Time: 08/22/18  1:03 AM  Result Value Ref Range   Alcohol, Ethyl (B) <10 <10 mg/dL    Comment: (NOTE) Lowest detectable limit for serum alcohol is 10 mg/dL. For  medical purposes only. Performed at [facility], [place] [address], [place], [place] [address]   Salicylate level     Status: None   Collection Time: 08/22/18  1:03 AM  Result Value Ref Range   Salicylate Lvl <2.7 2.8 - 30.0 mg/dL    Comment: Performed at [facility], [place] [address], [place], [place] [address]  Acetaminophen level     Status: Abnormal   Collection Time: 08/22/18  1:03 AM  Result Value Ref Range   Acetaminophen (Tylenol), Serum <10 (L) 10 - 30 ug/mL    Comment: (NOTE) Therapeutic concentrations vary significantly. A range of 10-30 ug/mL  may be an effective concentration for many patients. However, some  are best treated at concentrations outside of  this range. Acetaminophen concentrations >150 ug/mL at 4 hours after ingestion  and >50 ug/mL at 12 hours after ingestion are often associated with  toxic reactions. Performed at [facility], [place] [address], [place], [place] [address]   cbc     Status: Abnormal   Collection Time: 08/22/18  1:03 AM  Result Value Ref Range   WBC 5.1 4.0 - 10.5 K/uL   RBC 4.43 3.87 - 5.11 MIL/uL   Hemoglobin 11.9 (L) 12.0 - 15.0 g/dL   HCT 37.4 36.0 - 46.0 %   MCV 84.4 80.0 - 100.0 fL   MCH 26.9 26.0 - 34.0 pg   MCHC 31.8 30.0 - 36.0 g/dL   RDW 12.9 11.5 - 15.5 %   Platelets 221 150 - 400 K/uL   nRBC 0.0 0.0 - 0.2 %    Comment: Performed at [facility], [place] [address], [place], [place] [address]    Blood Alcohol level:  Lab Results  Component Value Date   ETH <10 08/22/2018   ETH <5 91/63/8466    Metabolic Disorder Labs: No results found for: HGBA1C, MPG No results found for: PROLACTIN No results found for: CHOL, TRIG, HDL, CHOLHDL, VLDL, LDLCALC  Physical Findings: AIMS: Facial and Oral Movements Muscles of Facial Expression: None, normal Lips and Perioral Area: None, normal Jaw: None, normal Tongue: None, normal,Extremity Movements Upper (arms, wrists, hands, fingers): None, normal Lower (legs, knees, ankles, toes): None, normal, Trunk Movements Neck, shoulders, hips: None, normal, Overall Severity Severity of abnormal movements (highest score from questions above): None, normal Incapacitation due to abnormal movements: None, normal Patient's awareness of abnormal movements (rate only patient's report): No Awareness, Dental Status Current problems with teeth and/or dentures?: No Does patient usually wear dentures?: No  CIWA:    COWS:     Musculoskeletal: Strength & Muscle Tone: within normal limits Gait & Station: normal Patient leans: N/A  Psychiatric Specialty Exam: Physical Exam  Nursing note and vitals reviewed. Constitutional: She  is oriented to person, place, and time. She appears well-developed and well-nourished.  HENT:  Head: Normocephalic and atraumatic.  Respiratory: Effort normal.  Neurological: She is alert and oriented to person, place, and time.    ROS  Blood pressure 123/89, pulse 90, temperature 99.9 F (37.7 C), temperature source Oral, resp. rate 18, height 5' 7"  (1.702 m), weight 51.3 kg, SpO2 90 %.Body mass index is 17.7 kg/m.  General Appearance: Casual  Eye Contact:  Fair  Speech:  Normal Rate  Volume:  Normal  Mood:  Anxious  Affect:  Congruent  Thought Process:  Coherent and Descriptions of Associations: Intact  Orientation:  Full (Time, Place, and Person)  Thought Content:  Logical  Suicidal Thoughts:  No  Homicidal Thoughts:  No  Memory:  Immediate;   Fair Recent;   Fair Remote;   Fair  Judgement:  Intact  Insight:  Fair  Psychomotor Activity:  Normal  Concentration:  Concentration: Fair and Attention Span: Fair  Recall:  [employer] of Knowledge:  Good  Language:  Good  Akathisia:  Negative  Handed:  Right  AIMS (if indicated):     Assets:  Communication Skills Desire for [place] Talents/Skills  ADL's:  Intact  Cognition:  WNL  Sleep:  Number of Hours: 6.75     Treatment Plan Summary: Daily contact with patient to assess and evaluate symptoms and progress in treatment, Medication management and Plan : Patient is seen and examined.  Patient is a 22 year old female with the above-stated past psychiatric history who is seen in follow-up.  #1 major depression/posttraumatic stress disorder-patient got nauseated yesterday with the Effexor.  She is going to try it one more time, if necessary we may have to stop it.  She is more interested in therapy after discharge, and this has been successful for her.  No change in treatment at this point.  #2 disposition planning-if things continue to improve we will plan on discharge in 1  to 2-day after we get collateral information from her parents.  Sharma Covert, MD 08/23/2018, 10:13 AM

## 2018-08-24 NOTE — Plan of Care (Signed)
  Problem: Education: Goal: Mental status will improve Outcome: Adequate for Discharge   Problem: Education: Goal: Verbalization of understanding the information provided will improve Outcome: Adequate for Discharge   Problem: Activity: Goal: Interest or engagement in activities will improve Outcome: Adequate for Discharge   Problem: Activity: Goal: Sleeping patterns will improve Outcome: Adequate for Discharge   

## 2018-08-24 NOTE — Progress Notes (Signed)
  [facility] Adult Case Management Discharge Plan :  Will you be returning to the same living situation after discharge:  No. Patient is discharging home with her father.  At discharge, do you have transportation home?: Yes,  patient's father is picking her up at discharge Do you have the ability to pay for your medications: No.  Release of information consent forms completed and in the chart;  Patient's signature needed at discharge.  Patient to Follow up at: Follow-up Information    [facility]. Go to.   Why:  CSW ubnable to schedule an appointment prior to discharge.Walk-in hours are Monday-Friday from 8:00am-5:00pm for outpatient medication management and therapy services. Be sure to have ID, SSN, and any discharge paperwork from this hospitalzation.  Contact information: [address] [place] [address] [phone]        [facility]. Call.   Why:  Please follow up for OB-GYN services at discharge, including removing your birth control arm inplant.  Contact information: [address] [place], [place] [address]  Phone: [phone] Fax: [phone]          Next level of care provider has access to [facility] Link:yes  Safety Planning and Suicide Prevention discussed: Yes,  with the patient's father  Have you used any form of tobacco in the last 30 days? (Cigarettes, Smokeless Tobacco, Cigars, and/or Pipes): No  Has patient been referred to the Quitline?: N/A patient is not a smoker  Patient has been referred for addiction treatment: N/A  Maeola Sarah, LCSWA 08/24/2018, 10:05 AM

## 2018-08-24 NOTE — Discharge Summary (Signed)
Physician Discharge Summary Note  Patient:  Rebekah Rhodes is an 22 y.o., female MRN:  [id number] DOB:  June 02, 1996 Patient phone:  [phone] (home)  Patient address:   [address] [place] [address],  Total Time spent with patient: 20 minutes  Date of Admission:  08/22/2018 Date of Discharge: 08/24/18  Reason for Admission:  Worsening depression with SI  Principal Problem: MDD (major depressive disorder), recurrent episode, severe [facility]) Discharge Diagnoses: Patient Active Problem List   Diagnosis Date Noted  . MDD (major depressive disorder), recurrent episode, severe (HCC) [F33.2] 08/22/2018  . Bacterial vaginitis [N76.0, B96.89] 05/16/2018  . Vaginal irritation [N89.8] 05/16/2018  . Cutaneous candidiasis [B37.2] 05/16/2018  . 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 Migraine [G43.909] 03/10/2014  . HA (headache) [R51]     Past Psychiatric History: Patient has never seen a psychiatrist in the past.  She is been treated with Lexapro, Zoloft and Prozac in the past by her primary care provider.  She has been in therapy, but is unable to continue that because of financial reasons.  No previous psychiatric admissions.  Past Medical History:  Past Medical History:  Diagnosis Date  . Anxiety   . Asthma   . Chlamydia   . Depression    was rx meds, has not picked them up  . Eczema   . Genital herpes   . HA (headache)   . Infection    UTI    Past Surgical History:  Procedure Laterality Date  . NO PAST SURGERIES    . None    . WISDOM TOOTH EXTRACTION     Family History:  Family History  Problem Relation Age of Onset  . Breast cancer Mother   . Cancer Maternal Grandmother        breast; also great grandma   Family Psychiatric  History: Patient denied any family history of psychiatric illness Social History:  Social History   Substance and Sexual Activity  Alcohol Use No     Social History   Substance and Sexual Activity  Drug  Use Not Currently  . Frequency: 7.0 times per week  . Types: Marijuana    Social History   Socioeconomic History  . Marital status: Single    Spouse name: Not on file  . Number of children: 0  . Years of education: 81  . Highest education level: Not on file  Occupational History    Comment: High school student  Social Needs  . Financial resource strain: Not on file  . Food insecurity:    Worry: Not on file    Inability: Not on file  . Transportation needs:    Medical: Not on file    Non-medical: Not on file  Tobacco Use  . Smoking status: Former Smoker    Packs/day: 0.25    Types: Cigarettes  . Smokeless tobacco: Never Used  Substance and Sexual Activity  . Alcohol use: No  . Drug use: Not Currently    Frequency: 7.0 times per week    Types: Marijuana  . Sexual activity: Yes    Birth control/protection: Implant  Lifestyle  . Physical activity:    Days per week: Not on file    Minutes per session: Not on file  . Stress: Not on file  Relationships  . Social connections:    Talks on phone: Not on file    Gets together: Not on file    Attends religious service: Not on file  Active member of club or organization: Not on file    Attends meetings of clubs or organizations: Not on file    Relationship status: Not on file  Other Topics Concern  . Not on file  Social History Narrative   Patient lives at home with her father Rebekah Rhodes).   Patient is a high Ecologist.   Right handed.   Caffeine soda one daily.    Hospital Course:   08/22/18 [facility] MD Assessment: 22 year old female with a past psychiatric history significant for major depression who presented to the [facility] emergency department on 08/22/2018 under involuntary commitment. According to the notes the patient had been placed under involuntary commitment by a friend. The friend stated that the patient had said she was going to kill her self. The patient had apparently stated  that she wanted to die on several occasions. The friend contacted the patient's mother, but the mother declined to intervene. The friend decided at that time to involuntarily commit her. Patient has been treated for depression in the past. She is been treated by her primary care provider. She has had periods of time where she took medication, but was primarily noncompliant. She had previously been prescribed fluoxetine, sertraline as well as S-Citalopram. She has been attempting to get into psychotherapy for some time, but does not have insurance. She feels like her mother is hateful, and does not like her. She has to live with her right now because she does not have the finances to be able to live independently. She sees her father is being supported. He was in the emergency room last night. The patient denied any history of depressive symptoms that were continuous. She stated that she would get depressed for occasional days. I discussed with her her history from the electronic medical record, but she does not admit to having that serious of an illness. She currently denies suicidal ideation. She is tearful, withdrawn, and appears depressed. She was admitted to the hospital for evaluation and stabilization.  Patient remained on the [facility] unit for 2 days. The patient stabilized on medication and therapy. Patient was discharged on no medications. Patient has shown improvement with improved mood, affect, sleep, appetite, and interaction. Patient has attended group and participated. Patient has been seen in the day room interacting with peers and staff appropriately. Patient denies any SI/HI/AVH and contracts for safety. Patient agrees to follow up at [facility]. Patient is provided with prescriptions for their medications upon discharge.   Physical Findings: AIMS: Facial and Oral Movements Muscles of Facial Expression: None, normal Lips and Perioral Area: None, normal Jaw:  None, normal Tongue: None, normal,Extremity Movements Upper (arms, wrists, hands, fingers): None, normal Lower (legs, knees, ankles, toes): None, normal, Trunk Movements Neck, shoulders, hips: None, normal, Overall Severity Severity of abnormal movements (highest score from questions above): None, normal Incapacitation due to abnormal movements: None, normal Patient's awareness of abnormal movements (rate only patient's report): No Awareness, Dental Status Current problems with teeth and/or dentures?: No Does patient usually wear dentures?: No  CIWA:    COWS:     Musculoskeletal: Strength & Muscle Tone: within normal limits Gait & Station: normal Patient leans: N/A  Psychiatric Specialty Exam: Physical Exam  Nursing note and vitals reviewed. Constitutional: She is oriented to person, place, and time. She appears well-developed and well-nourished.  Cardiovascular: Normal rate.  Respiratory: Effort normal.  Musculoskeletal: Normal range of motion.  Neurological: She is alert and oriented to person,  place, and time.  Skin: Skin is warm.    Review of Systems  Constitutional: Negative.   HENT: Negative.   Eyes: Negative.   Respiratory: Negative.   Cardiovascular: Negative.   Gastrointestinal: Negative.   Genitourinary: Negative.   Musculoskeletal: Negative.   Skin: Negative.   Neurological: Negative.   Endo/Heme/Allergies: Negative.   Psychiatric/Behavioral: Negative.     Blood pressure 123/89, pulse 90, temperature 99.9 F (37.7 C), temperature source Oral, resp. rate 18, height 5\' 7"  (1.702 m), weight 51.3 kg, SpO2 90 %.Body mass index is 17.7 kg/m.  General Appearance: Casual  Eye Contact:  Good  Speech:  Clear and Coherent and Normal Rate  Volume:  Normal  Mood:  Euthymic  Affect:  Congruent  Thought Process:  Goal Directed and Descriptions of Associations: Intact  Orientation:  Full (Time, Place, and Person)  Thought Content:  WDL  Suicidal Thoughts:  No   Homicidal Thoughts:  No  Memory:  Immediate;   Good Recent;   Good Remote;   Good  Judgement:  Fair  Insight:  Good  Psychomotor Activity:  Normal  Concentration:  Concentration: Good and Attention Span: Good  Recall:  Good  Fund of Knowledge:  Good  Language:  Good  Akathisia:  No  Handed:  Right  AIMS (if indicated):     Assets:  Communication Skills Desire for Improvement Financial Resources/Insurance Housing Physical Health Social Support Transportation  ADL's:  Intact  Cognition:  WNL  Sleep:  Number of Hours: 6.75     Have you used any form of tobacco in the last 30 days? (Cigarettes, Smokeless Tobacco, Cigars, and/or Pipes): No  Has this patient used any form of tobacco in the last 30 days? (Cigarettes, Smokeless Tobacco, Cigars, and/or Pipes) Yes, No  Blood Alcohol level:  Lab Results  Component Value Date   ETH <10 08/22/2018   ETH <5 10/22/2014    Metabolic Disorder Labs:  No results found for: HGBA1C, MPG No results found for: PROLACTIN No results found for: CHOL, TRIG, HDL, CHOLHDL, VLDL, LDLCALC  See Psychiatric Specialty Exam and Suicide Risk Assessment completed by Attending Physician prior to discharge.  Discharge destination:  Home  Is patient on multiple antipsychotic therapies at discharge:  No   Has Patient had three or more failed trials of antipsychotic monotherapy by history:  No  Recommended Plan for Multiple Antipsychotic Therapies: NA   Allergies as of 08/24/2018      Reactions   Amoxicillin Hives, Diarrhea, Nausea And Vomiting   Childhood allergy Has patient had a PCN reaction causing immediate rash, facial/tongue/throat swelling, SOB or lightheadedness with hypotension: Yes Has patient had a PCN reaction causing severe rash involving mucus membranes or skin necrosis: No Has patient had a PCN reaction that required hospitalization: No Has patient had a PCN reaction occurring within the last 10 years: No If all of the above  answers are "NO", then may proceed with Cephalosporin use.   Penicillins Hives, Diarrhea, Nausea And Vomiting   Has patient had a PCN reaction causing immediate rash, facial/tongue/throat swelling, SOB or lightheadedness with hypotension: Yes Has patient had a PCN reaction causing severe rash involving mucus membranes or skin necrosis: No Has patient had a PCN reaction that required hospitalization No Has patient had a PCN reaction occurring within the last 10 years: No If all of the above answers are "NO", then may proceed with Cephalosporin      Medication List    STOP taking these medications  acyclovir 400 MG tablet Commonly known as:  ZOVIRAX   albuterol 108 (90 Base) MCG/ACT inhaler Commonly known as:  PROVENTIL HFA;VENTOLIN HFA   fluconazole 150 MG tablet Commonly known as:  DIFLUCAN   metroNIDAZOLE 500 MG tablet Commonly known as:  FLAGYL   nystatin-triamcinolone cream Commonly known as:  MYCOLOG II      Follow-up Information    [facility]. Go to.   Why:  CSW ubnable to schedule an appointment prior to discharge.Walk-in hours are Monday-Friday from 8:00am-5:00pm for outpatient medication management and therapy services. Be sure to have ID, SSN, and any discharge paperwork from this hospitalzation.  Contact information: [address] [place] [address] [phone]        [facility]. Call.   Why:  Please follow up for OB-GYN services at discharge, including removing your birth control arm inplant.  Contact information: [address] [place], [place] [address]  Phone: [phone] Fax: [phone]          Follow-up recommendations:  Continue activity as tolerated. Continue diet as recommended by your PCP. Ensure to keep all appointments with outpatient providers.  Comments:  Patient is instructed prior to discharge to: Take all medications as prescribed by his/her mental healthcare provider. Report any adverse effects and or reactions  from the medicines to his/her outpatient provider promptly. Patient has been instructed & cautioned: To not engage in alcohol and or illegal drug use while on prescription medicines. In the event of worsening symptoms, patient is instructed to call the crisis hotline, 911 and or go to the nearest ED for appropriate evaluation and treatment of symptoms. To follow-up with his/her primary care provider for your other medical issues, concerns and or health care needs.    Signed: Gerlene Burdock Dinesha Twiggs, FNP 08/24/2018, 3:12 PM

## 2018-08-24 NOTE — Tx Team (Signed)
Interdisciplinary Treatment and Diagnostic Plan Update  08/24/2018 Time of Session: 9:30am Rebekah SUARES MRN: [id number]  Principal Diagnosis: <principal problem not specified>  Secondary Diagnoses: Active Problems:   MDD (major depressive disorder), recurrent episode, severe (HCC)   Current Medications:  Current Facility-Administered Medications  Medication Dose Route Frequency Provider Last Rate Last Dose  . acetaminophen (TYLENOL) tablet 650 mg  650 mg Oral Q6H PRN Kerry Hough, PA-C      . alum & mag hydroxide-simeth (MAALOX/MYLANTA) 200-200-20 MG/5ML suspension 30 mL  30 mL Oral Q4H PRN Donell Sievert E, PA-C      . hydrOXYzine (ATARAX/VISTARIL) tablet 10 mg  10 mg Oral Q6H PRN Antonieta Pert, MD      . magnesium hydroxide (MILK OF MAGNESIA) suspension 30 mL  30 mL Oral Daily PRN Kerry Hough, PA-C      . traZODone (DESYREL) tablet 25 mg  25 mg Oral QHS,MR X 1 Clary, Marlane Mingle, MD       PTA Medications: Medications Prior to Admission  Medication Sig Dispense Refill Last Dose  . acyclovir (ZOVIRAX) 400 MG tablet Take 1 tablet (400 mg total) by mouth 3 (three) times daily. (Patient not taking: Reported on 08/07/2018) 15 tablet 2 Completed Course at Unknown time  . albuterol (PROVENTIL HFA;VENTOLIN HFA) 108 (90 Base) MCG/ACT inhaler Inhale 2 puffs into the lungs every 6 (six) hours as needed for wheezing or shortness of breath. 1 Inhaler 2 unknown  . fluconazole (DIFLUCAN) 150 MG tablet Take 1 tablet (150 mg total) by mouth daily. (Patient not taking: Reported on 08/07/2018) 1 tablet 1 Completed Course at Unknown time  . metroNIDAZOLE (FLAGYL) 500 MG tablet Take 1 tablet (500 mg total) by mouth 2 (two) times daily. (Patient not taking: Reported on 08/07/2018) 14 tablet 0 Completed Course at Unknown time  . nystatin-triamcinolone (MYCOLOG II) cream Apply to affected area daily (Patient not taking: Reported on 08/07/2018) 15 g 0 Completed Course at Unknown time    Patient  Stressors: Marital or family conflict  Patient Strengths: Average or above average intelligence Psychologist, counselling means Physical Health Supportive family/friends Work skills  Treatment Modalities: Medication Management, Group therapy, Case management,  1 to 1 session with clinician, Psychoeducation, Recreational therapy.   Physician Treatment Plan for Primary Diagnosis: <principal problem not specified> Long Term Goal(s): Improvement in symptoms so as ready for discharge Improvement in symptoms so as ready for discharge   Short Term Goals: Ability to identify changes in lifestyle to reduce recurrence of condition will improve Ability to verbalize feelings will improve Ability to disclose and discuss suicidal ideas Ability to demonstrate self-control will improve Ability to identify and develop effective coping behaviors will improve Ability to maintain clinical measurements within normal limits will improve Ability to identify changes in lifestyle to reduce recurrence of condition will improve Ability to verbalize feelings will improve Ability to disclose and discuss suicidal ideas Ability to demonstrate self-control will improve Ability to identify and develop effective coping behaviors will improve Ability to maintain clinical measurements within normal limits will improve  Medication Management: Evaluate patient's response, side effects, and tolerance of medication regimen.  Therapeutic Interventions: 1 to 1 sessions, Unit Group sessions and Medication administration.  Evaluation of Outcomes: Adequate for Discharge  Physician Treatment Plan for Secondary Diagnosis: Active Problems:   MDD (major depressive disorder), recurrent episode, severe (HCC)  Long Term Goal(s): Improvement in symptoms so as ready for discharge Improvement in symptoms so as ready for discharge  Short Term Goals: Ability to identify changes in lifestyle to reduce recurrence of condition  will improve Ability to verbalize feelings will improve Ability to disclose and discuss suicidal ideas Ability to demonstrate self-control will improve Ability to identify and develop effective coping behaviors will improve Ability to maintain clinical measurements within normal limits will improve Ability to identify changes in lifestyle to reduce recurrence of condition will improve Ability to verbalize feelings will improve Ability to disclose and discuss suicidal ideas Ability to demonstrate self-control will improve Ability to identify and develop effective coping behaviors will improve Ability to maintain clinical measurements within normal limits will improve     Medication Management: Evaluate patient's response, side effects, and tolerance of medication regimen.  Therapeutic Interventions: 1 to 1 sessions, Unit Group sessions and Medication administration.  Evaluation of Outcomes: Adequate for Discharge   RN Treatment Plan for Primary Diagnosis: <principal problem not specified> Long Term Goal(s): Knowledge of disease and therapeutic regimen to maintain health will improve  Short Term Goals: Ability to participate in decision making will improve, Ability to verbalize feelings will improve and Ability to identify and develop effective coping behaviors will improve  Medication Management: RN will administer medications as ordered by provider, will assess and evaluate patient's response and provide education to patient for prescribed medication. RN will report any adverse and/or side effects to prescribing provider.  Therapeutic Interventions: 1 on 1 counseling sessions, Psychoeducation, Medication administration, Evaluate responses to treatment, Monitor vital signs and CBGs as ordered, Perform/monitor CIWA, COWS, AIMS and Fall Risk screenings as ordered, Perform wound care treatments as ordered.  Evaluation of Outcomes: Adequate for Discharge   LCSW Treatment Plan for Primary  Diagnosis: <principal problem not specified> Long Term Goal(s): Safe transition to appropriate next level of care at discharge, Engage patient in therapeutic group addressing interpersonal concerns.  Short Term Goals: Engage patient in aftercare planning with referrals and resources  Therapeutic Interventions: Assess for all discharge needs, 1 to 1 time with Social worker, Explore available resources and support systems, Assess for adequacy in community support network, Educate family and significant other(s) on suicide prevention, Complete Psychosocial Assessment, Interpersonal group therapy.  Evaluation of Outcomes: Adequate for Discharge   Progress in Treatment: Attending groups: Yes. Participating in groups: Yes. Taking medication as prescribed: Yes. Toleration medication: Yes. Family/Significant other contact made: Yes, individual(s) contacted:  with the patient's father Patient understands diagnosis: Yes. Discussing patient identified problems/goals with staff: Yes. Medical problems stabilized or resolved: Yes. Denies suicidal/homicidal ideation: Yes. Issues/concerns per patient self-inventory: No. Other:   New problem(s) identified: None   New Short Term/Long Term Goal(s): medication stabilization, elimination of SI thoughts, development of comprehensive mental wellness plan.    Patient Goals:  "I wanted help with my depression and I wanted to learn coping skills"   Discharge Plan or Barriers: Patient plans to discharge home with her father and follow up with [facility] for medication management and therapy services.   Reason for Continuation of Hospitalization: None   Estimated Length of Stay: 08/24/18  Attendees: Patient: Rebekah Rhodes 08/24/2018 8:49 AM  Physician: Dr. Landry Mellow, MD 08/24/2018 8:49 AM  Nursing: Arlyss Repress.Leonard Schwartz, RN 08/24/2018 8:49 AM  RN Care Manager: Onnie Boer, RN 08/24/2018 8:49 AM  Social Worker: Baldo Daub, LCSWA 08/24/2018 8:49 AM  Recreational  Therapist: Juliann Pares 08/24/2018 8:49 AM  Other: Serena Colonel, NP 08/24/2018 8:49 AM  Other:  08/24/2018 8:49 AM  Other: 08/24/2018 8:49 AM    Scribe for Treatment Team: Maeola Sarah, LCSWA  08/24/2018 8:49 AM

## 2018-08-24 NOTE — Progress Notes (Signed)
Discharge note: Patient reviewed discharge paperwork with RN including prescriptions, follow up appointments, and lab work. Patient given the opportunity to ask questions. All concerns were addressed. All belongings were returned to patient. Denied SI/HI/AVH. Patient thanked staff for their care while at the hospital.  Patient was discharged to lobby where her father was waiting to pick her up. 

## 2018-08-24 NOTE — [facility] Suicide Risk Assessment (Signed)
[facility] Discharge Suicide Risk Assessment   Principal Problem: <principal problem not specified> Discharge Diagnoses:  Patient Active Problem List   Diagnosis Date Noted  . MDD (major depressive disorder), recurrent episode, severe (HCC) [F33.2] 08/22/2018  . Bacterial vaginitis [N76.0, B96.89] 05/16/2018  . Vaginal irritation [N89.8] 05/16/2018  . Cutaneous candidiasis [B37.2] 05/16/2018  . 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 Migraine [G43.909] 03/10/2014  . HA (headache) [R51]     Total Time spent with patient: 15 minutes  Musculoskeletal: Strength & Muscle Tone: within normal limits Gait & Station: normal Patient leans: N/A  Psychiatric Specialty Exam: Review of Systems  All other systems reviewed and are negative.   Blood pressure 123/89, pulse 90, temperature 99.9 F (37.7 C), temperature source Oral, resp. rate 18, height 5\' 7"  (1.702 m), weight 51.3 kg, SpO2 90 %.Body mass index is 17.7 kg/m.  General Appearance: Casual  Eye Contact::  Fair  Speech:  Normal Rate409  Volume:  Decreased  Mood:  Euthymic  Affect:  Congruent  Thought Process:  Coherent and Descriptions of Associations: Intact  Orientation:  Full (Time, Place, and Person)  Thought Content:  Logical  Suicidal Thoughts:  No  Homicidal Thoughts:  No  Memory:  Immediate;   Fair Recent;   Fair Remote;   Fair  Judgement:  Intact  Insight:  Fair  Psychomotor Activity:  Normal  Concentration:  Fair  Recall:  Good  Fund of Knowledge:Good  Language: Good  Akathisia:  Negative  Handed:  Right  AIMS (if indicated):     Assets:  Desire for Improvement Housing Leisure Time Physical Health Resilience Social Support  Sleep:  Number of Hours: 6.75  Cognition: WNL  ADL's:  Intact   Mental Status Per Nursing Assessment::   On Admission:  Self-harm thoughts  Demographic Factors:  Adolescent or young adult, Low socioeconomic status and Unemployed  Loss  Factors: NA  Historical Factors: Impulsivity  Risk Reduction Factors:   Sense of responsibility to family, Living with another person, especially a relative and Positive social support  Continued Clinical Symptoms:  Depression:   Impulsivity  Cognitive Features That Contribute To Risk:  None    Suicide Risk:  Minimal: No identifiable suicidal ideation.  Patients presenting with no risk factors but with morbid ruminations; may be classified as minimal risk based on the severity of the depressive symptoms    Plan Of Care/Follow-up recommendations:  Activity:  ad lib  Antonieta Pert, MD 08/24/2018, 7:37 AM

## 2018-08-24 NOTE — Progress Notes (Signed)
Recreation Therapy Notes  Date: 10.25.19 Time: 0930 Location: [address] Dayroom  Group Topic: Stress Management  Goal Area(s) Addresses:  Patient will verbalize importance of using healthy stress management.  Patient will identify positive emotions associated with healthy stress management.   Intervention: Stress Management  Activity :  Meditation. LRT introduced the stress management technique of meditation.  LRT played a meditation for patients to engage in a meditation on gratitude.  Patients were to listen and follow along as the meditation was played.  Education:  Stress Management, Discharge Planning.   Education Outcome: Acknowledges edcuation/In group clarification offered/Needs additional education  Clinical Observations/Feedback:  Pt did not attend group.    Caroll Rancher, LRT/CTRS         Caroll Rancher A 08/24/2018 11:43 AM

## 2018-09-13 ENCOUNTER — Emergency Department (HOSPITAL_COMMUNITY)
Admission: EM | Admit: 2018-09-13 | Discharge: 2018-09-13 | Disposition: A | Discharge status: Home / Self Care | Payer: Self-pay | Attending: Emergency Medicine | Admitting: Emergency Medicine

## 2018-09-13 ENCOUNTER — Other Ambulatory Visit: Payer: Self-pay

## 2018-09-13 DIAGNOSIS — F419 Anxiety disorder, unspecified: Secondary | ICD-10-CM | POA: Insufficient documentation

## 2018-09-13 DIAGNOSIS — J45909 Unspecified asthma, uncomplicated: Secondary | ICD-10-CM | POA: Insufficient documentation

## 2018-09-13 DIAGNOSIS — Z87891 Personal history of nicotine dependence: Secondary | ICD-10-CM | POA: Insufficient documentation

## 2018-09-13 DIAGNOSIS — F329 Major depressive disorder, single episode, unspecified: Secondary | ICD-10-CM | POA: Insufficient documentation

## 2018-09-13 DIAGNOSIS — R300 Dysuria: Secondary | ICD-10-CM | POA: Insufficient documentation

## 2018-09-13 DIAGNOSIS — N898 Other specified noninflammatory disorders of vagina: Secondary | ICD-10-CM | POA: Insufficient documentation

## 2018-09-13 LAB — URINALYSIS, ROUTINE W REFLEX MICROSCOPIC
Bacteria, UA: NONE SEEN
Bilirubin Urine: NEGATIVE
GLUCOSE, UA: NEGATIVE mg/dL
Ketones, ur: NEGATIVE mg/dL
Nitrite: NEGATIVE
PROTEIN: NEGATIVE mg/dL
Specific Gravity, Urine: 1.021 (ref 1.005–1.030)
pH: 5 (ref 5.0–8.0)

## 2018-09-13 LAB — I-STAT BETA HCG BLOOD, ED (MC, WL, AP ONLY): I-stat hCG, quantitative: <5 m[IU]/mL (ref <5)

## 2018-09-13 LAB — WET PREP, GENITAL
Trich, Wet Prep: NONE SEEN
Yeast Wet Prep HPF POC: NONE SEEN

## 2018-09-13 LAB — POC URINE PREG, ED: PREG TEST UR: NEGATIVE

## 2018-09-13 MED ORDER — ONDANSETRON HCL 4 MG/2ML IJ SOLN: 4.0000 mg | Freq: Once | INTRAMUSCULAR | Status: DC

## 2018-09-13 MED ORDER — AZITHROMYCIN 250 MG PO TABS
1000.0000 mg | ORAL_TABLET | Freq: Once | ORAL | Status: AC
  Administered 2018-09-13: 1000 mg via ORAL
  Filled 2018-09-13: qty 4

## 2018-09-13 MED ORDER — PHENAZOPYRIDINE HCL 100 MG PO TABS
100.0000 mg | ORAL_TABLET | Freq: Once | ORAL | Status: AC
  Administered 2018-09-13: 100 mg via ORAL
  Filled 2018-09-13: qty 1

## 2018-09-13 MED ORDER — IBUPROFEN 200 MG PO TABS
600.0000 mg | ORAL_TABLET | Freq: Once | ORAL | Status: AC
  Administered 2018-09-13: 600 mg via ORAL
  Filled 2018-09-13: qty 3

## 2018-09-13 MED ORDER — CEPHALEXIN 500 MG PO CAPS: 500.0000 mg | ORAL_CAPSULE | Freq: Two times a day (BID) | ORAL | 0 refills | Status: AC

## 2018-09-13 MED ORDER — CEFTRIAXONE SODIUM 250 MG IJ SOLR
250.0000 mg | Freq: Once | INTRAMUSCULAR | Status: AC
  Administered 2018-09-13: 250 mg via INTRAMUSCULAR
  Filled 2018-09-13: qty 250

## 2018-09-13 MED ORDER — PHENAZOPYRIDINE HCL 100 MG PO TABS: 100.0000 mg | ORAL_TABLET | Freq: Three times a day (TID) | ORAL | 0 refills | Status: DC

## 2018-09-13 MED ORDER — ONDANSETRON 4 MG PO TBDP
4.0000 mg | ORAL_TABLET | Freq: Once | ORAL | Status: AC
  Administered 2018-09-13: 4 mg via ORAL
  Filled 2018-09-13: qty 1

## 2018-09-13 MED ORDER — LIDOCAINE HCL 1 % IJ SOLN
INTRAMUSCULAR | Status: AC
  Administered 2018-09-13: 1 mL
  Filled 2018-09-13: qty 20
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
## 2018-09-13 NOTE — Consult Note (Signed)
The SANE/FNE Teacher, music(Forensic Nurse Examiner) consult has been completed. The PA has been notified. A referral for follow up has been sent to women's clinic via Epic. Please contact the SANE/FNE nurse on call (listed in Amion) with any further concerns.

## 2018-09-13 NOTE — Discharge Instructions (Addendum)
Take Keflex twice daily for 5 days.  Take Pyridium 3 times daily as needed for urinary discomfort.  Please follow-up with the women's outpatient clinic for recheck of your urine and further evaluation of your symptoms.  You have been treated for gonorrhea and chlamydia today.  You will be called if you test positive.  Make sure to tell your sexual partners that they would need to be treated as well if you are positive.  You also be called if your urine culture results and you need a different antibiotic.  Please return to the emergency department if you develop any new or worsening symptoms including fever over 100.4, severe worsening abdominal pain, or any other concerning symptoms.
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
## 2018-09-13 NOTE — ED Notes (Signed)
Vonna KotykJay from lab confirmed that they have the urine culture tube and will run the urine culture.

## 2018-09-14 LAB — GC/CHLAMYDIA PROBE AMP (~~LOC~~) NOT AT ARMC
Chlamydia: NEGATIVE
Neisseria Gonorrhea: NEGATIVE

## 2018-09-16 LAB — URINE CULTURE: Culture: >=100000 — AB

## 2018-09-17 ENCOUNTER — Encounter: Payer: Self-pay | Admitting: *Deleted

## 2018-09-17 ENCOUNTER — Ambulatory Visit: Payer: Self-pay

## 2018-09-17 ENCOUNTER — Telehealth: Payer: Self-pay | Admitting: *Deleted

## 2018-09-17 NOTE — Telephone Encounter (Signed)
Post ED Visit - Positive Culture Follow-up  Culture report reviewed by antimicrobial stewardship pharmacist:  []  Enzo BiNathan Batchelder, Pharm.D. []  Celedonio MiyamotoJeremy Frens, Pharm.D., BCPS AQ-ID []  Garvin FilaMike Maccia, Pharm.D., BCPS []  Georgina PillionElizabeth Martin, [address]Pharm[address]D., BCPS []  Big PointMinh Pham, Vermont[place]arm.D., BCPS, AAHIVP []  Estella HuskMichelle Turner, Pharm.D., BCPS, AAHIVP [x]  Lysle Pearlachel Rumbarger, PharmD, BCPS []  Phillips Climeshuy Dang, PharmD, BCPS []  Agapito GamesAlison Masters, PharmD, BCPS []  Verlan FriendsErin Deja, PharmD  Positive urine culture Treated with Cephalexin, organism sensitive to the same and no further patient follow-up is required at this time.  Virl AxeRobertson, Chyrl [facility] 09/17/2018, 11:35 AM

## 2018-09-26 ENCOUNTER — Ambulatory Visit: Payer: Self-pay | Admitting: Advanced Practice Midwife
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
## 2018-11-01 ENCOUNTER — Other Ambulatory Visit: Payer: Self-pay | Admitting: General Practice

## 2018-11-01 DIAGNOSIS — B9689 Other specified bacterial agents as the cause of diseases classified elsewhere: Secondary | ICD-10-CM

## 2018-11-01 DIAGNOSIS — N760 Acute vaginitis: Principal | ICD-10-CM

## 2018-11-01 MED ORDER — METRONIDAZOLE 500 MG PO TABS: 500.0000 mg | ORAL_TABLET | Freq: Two times a day (BID) | ORAL | 0 refills | Status: DC

## 2019-04-08 ENCOUNTER — Encounter: Payer: Self-pay | Admitting: Physician Assistant

## 2019-04-08 ENCOUNTER — Telehealth: Payer: Self-pay | Admitting: Emergency Medicine

## 2019-04-08 ENCOUNTER — Telehealth: Payer: Self-pay | Admitting: Physician Assistant

## 2019-04-08 DIAGNOSIS — B9689 Other specified bacterial agents as the cause of diseases classified elsewhere: Secondary | ICD-10-CM

## 2019-04-08 DIAGNOSIS — N760 Acute vaginitis: Secondary | ICD-10-CM

## 2019-04-08 MED ORDER — METRONIDAZOLE 500 MG PO TABS: 500.0000 mg | ORAL_TABLET | Freq: Two times a day (BID) | ORAL | 0 refills | Status: DC

## 2019-04-08 MED ORDER — FLUCONAZOLE 150 MG PO TABS: 150.0000 mg | ORAL_TABLET | Freq: Once | ORAL | 0 refills | Status: DC

## 2019-04-08 NOTE — Addendum Note (Signed)
Addended by: Waldon Merl on: 04/08/2019 12:00 PM   Modules accepted: Orders

## 2019-04-08 NOTE — Progress Notes (Signed)
We are sorry that you are not feeling well. Here is how we plan to help! Based on what you shared with me it looks like you: May have a yeast vaginosis  Vaginosis is an inflammation of the vagina that can result in discharge, itching and pain. The cause is usually a change in the normal balance of vaginal bacteria or an infection. Vaginosis can also result from reduced estrogen levels after menopause.  The most common causes of vaginosis are:   Bacterial vaginosis which results from an overgrowth of one on several organisms that are normally present in your vagina.   Yeast infections which are caused by a naturally occurring fungus called candida.   Vaginal atrophy (atrophic vaginosis) which results from the thinning of the vagina from reduced estrogen levels after menopause.   Trichomoniasis which is caused by a parasite and is commonly transmitted by sexual intercourse.  Factors that increase your risk of developing vaginosis include: . Medications, such as antibiotics and steroids . Uncontrolled diabetes . Use of hygiene products such as bubble bath, vaginal spray or vaginal deodorant . Douching . Wearing damp or tight-fitting clothing . Using an intrauterine device (IUD) for birth control . Hormonal changes, such as those associated with pregnancy, birth control pills or menopause . Sexual activity . Having a sexually transmitted infection  Your treatment plan is A single Diflucan (fluconazole) 150mg tablet once.  I have electronically sent this prescription into the pharmacy that you have chosen.  Be sure to take all of the medication as directed. Stop taking any medication if you develop a rash, tongue swelling or shortness of breath. Mothers who are breast feeding should consider pumping and discarding their breast milk while on these antibiotics. However, there is no consensus that infant exposure at these doses would be harmful.  Remember that medication creams can weaken latex  condoms. .   HOME CARE:  Good hygiene may prevent some types of vaginosis from recurring and may relieve some symptoms:  . Avoid baths, hot tubs and whirlpool spas. Rinse soap from your outer genital area after a shower, and dry the area well to prevent irritation. Don't use scented or harsh soaps, such as those with deodorant or antibacterial action. . Avoid irritants. These include scented tampons and pads. . Wipe from front to back after using the toilet. Doing so avoids spreading fecal bacteria to your vagina.  Other things that may help prevent vaginosis include:  . Don't douche. Your vagina doesn't require cleansing other than normal bathing. Repetitive douching disrupts the normal organisms that reside in the vagina and can actually increase your risk of vaginal infection. Douching won't clear up a vaginal infection. . Use a latex condom. Both female and female latex condoms may help you avoid infections spread by sexual contact. . Wear cotton underwear. Also wear pantyhose with a cotton crotch. If you feel comfortable without it, skip wearing underwear to bed. Yeast thrives in moist environments Your symptoms should improve in the next day or two.  GET HELP RIGHT AWAY IF:  . You have pain in your lower abdomen ( pelvic area or over your ovaries) . You develop nausea or vomiting . You develop a fever . Your discharge changes or worsens . You have persistent pain with intercourse . You develop shortness of breath, a rapid pulse, or you faint.  These symptoms could be signs of problems or infections that need to be evaluated by a medical provider now.  MAKE SURE YOU      Understand these instructions.  Will watch your condition.  Will get help right away if you are not doing well or get worse.  Your e-visit answers were reviewed by a board certified advanced clinical practitioner to complete your personal care plan. Depending upon the condition, your plan could have included  both over the counter or prescription medications. Please review your pharmacy choice to make sure that you have choses a pharmacy that is open for you to pick up any needed prescription, Your safety is important to us. If you have drug allergies check your prescription carefully.   You can use MyChart to ask questions about today's visit, request a non-urgent call back, or ask for a work or school excuse for 24 hours related to this e-Visit. If it has been greater than 24 hours you will need to follow up with your provider, or enter a new e-Visit to address those concerns. You will get a MyChart message within the next two days asking about your experience. I hope that your e-visit has been valuable and will speed your recovery.  I spent 5-10 minutes on review and completion of this note-   PAC  

## 2019-04-08 NOTE — Telephone Encounter (Signed)
Pt called and left a message on the nurse voicemail line stating that she needed to make an appointment and was unable to get through on the appointment line.

## 2019-04-08 NOTE — Progress Notes (Signed)
Spoke to patient, indicates discharge is typical of BV, which she gets frequently. Advised pt to have in office evaluation to rule out other infections such as STD. Will cancel Diflucan Rx and will send in Metronidazole.

## 2019-04-10 ENCOUNTER — Telehealth: Payer: Self-pay | Admitting: Family Medicine

## 2019-04-10 NOTE — Telephone Encounter (Signed)
Spoke with patient about her appointment on 6/11 @ 9:20. Patient was instructed to wear a face mask for the entire appointment and no visitors are allowed with her. Patient was screened for covid symptoms and denied having any.

## 2019-04-11 ENCOUNTER — Other Ambulatory Visit (HOSPITAL_COMMUNITY)
Admission: RE | Admit: 2019-04-11 | Discharge: 2019-04-11 | Disposition: A | Discharge status: Home / Self Care | Payer: Medicaid Other | Source: Ambulatory Visit | Attending: Family Medicine | Admitting: Family Medicine

## 2019-04-11 ENCOUNTER — Telehealth: Payer: Self-pay | Admitting: Family Medicine

## 2019-04-11 ENCOUNTER — Other Ambulatory Visit: Payer: Self-pay

## 2019-04-11 ENCOUNTER — Ambulatory Visit (INDEPENDENT_AMBULATORY_CARE_PROVIDER_SITE_OTHER): Payer: Self-pay

## 2019-04-11 DIAGNOSIS — Z202 Contact with and (suspected) exposure to infections with a predominantly sexual mode of transmission: Secondary | ICD-10-CM | POA: Insufficient documentation

## 2019-04-11 NOTE — Progress Notes (Signed)
Pt here today due to her wanting to be tested for STD.  Pt request BV and yeast even though she is already taking Flagyl prophylaxis due to report of fishy vaginal odor.  Informed pt that it will take 24-48 hours for results and we will call with abnormal results.  Pt verbalized understanding.   Mel Almond, RN 04/11/19

## 2019-04-12 LAB — CERVICOVAGINAL ANCILLARY ONLY
Bacterial vaginitis: POSITIVE — AB
Candida vaginitis: NEGATIVE
Chlamydia: NEGATIVE
Neisseria Gonorrhea: NEGATIVE
Trichomonas: NEGATIVE

## 2019-04-12 NOTE — Progress Notes (Signed)
I agree with the nurses note and documentation.  Noni Saupe I, NP 04/12/2019 8:14 AM

## 2019-06-22 ENCOUNTER — Emergency Department (HOSPITAL_COMMUNITY)
Admission: EM | Admit: 2019-06-22 | Discharge: 2019-06-22 | Disposition: A | Discharge status: Home / Self Care | Payer: Self-pay | Attending: Emergency Medicine | Admitting: Emergency Medicine

## 2019-06-22 ENCOUNTER — Encounter (HOSPITAL_COMMUNITY): Payer: Self-pay | Admitting: *Deleted

## 2019-06-22 ENCOUNTER — Other Ambulatory Visit: Payer: Self-pay

## 2019-06-22 ENCOUNTER — Emergency Department (HOSPITAL_COMMUNITY): Payer: Self-pay

## 2019-06-22 DIAGNOSIS — F1210 Cannabis abuse, uncomplicated: Secondary | ICD-10-CM | POA: Insufficient documentation

## 2019-06-22 DIAGNOSIS — J45909 Unspecified asthma, uncomplicated: Secondary | ICD-10-CM | POA: Insufficient documentation

## 2019-06-22 DIAGNOSIS — R0789 Other chest pain: Secondary | ICD-10-CM | POA: Insufficient documentation

## 2019-06-22 DIAGNOSIS — R1012 Left upper quadrant pain: Secondary | ICD-10-CM | POA: Insufficient documentation

## 2019-06-22 LAB — CBC WITH DIFFERENTIAL/PLATELET
Abs Immature Granulocytes: 0.01 10*3/uL (ref 0.00–0.07)
Basophils Absolute: 0.1 10*3/uL (ref 0.0–0.1)
Basophils Relative: 2 %
Eosinophils Absolute: 0 10*3/uL (ref 0.0–0.5)
Eosinophils Relative: 1 %
HCT: 38 % (ref 36.0–46.0)
Hemoglobin: 12.1 g/dL (ref 12.0–15.0)
Immature Granulocytes: 0 %
Lymphocytes Relative: 58 %
Lymphs Abs: 2 10*3/uL (ref 0.7–4.0)
MCH: 27.3 pg (ref 26.0–34.0)
MCHC: 31.8 g/dL (ref 30.0–36.0)
MCV: 85.8 fL (ref 80.0–100.0)
Monocytes Absolute: 0.4 10*3/uL (ref 0.1–1.0)
Monocytes Relative: 12 %
Neutro Abs: 0.9 10*3/uL — ABNORMAL LOW (ref 1.7–7.7)
Neutrophils Relative %: 27 %
Platelets: 200 10*3/uL (ref 150–400)
RBC: 4.43 MIL/uL (ref 3.87–5.11)
RDW: 12.6 % (ref 11.5–15.5)
WBC: 3.4 10*3/uL — ABNORMAL LOW (ref 4.0–10.5)
nRBC: 0 % (ref 0.0–0.2)

## 2019-06-22 LAB — COMPREHENSIVE METABOLIC PANEL
ALT: 13 U/L (ref 0–44)
AST: 18 U/L (ref 15–41)
Albumin: 4.7 g/dL (ref 3.5–5.0)
Alkaline Phosphatase: 45 U/L (ref 38–126)
Anion gap: 8 (ref 5–15)
BUN: 13 mg/dL (ref 6–20)
CO2: 25 mmol/L (ref 22–32)
Calcium: 9.4 mg/dL (ref 8.9–10.3)
Chloride: 106 mmol/L (ref 98–111)
Creatinine, Ser: 0.58 mg/dL (ref 0.44–1.00)
GFR calc Af Amer: >60 mL/min (ref >60)
GFR calc non Af Amer: >60 mL/min (ref >60)
Glucose, Bld: 81 mg/dL (ref 70–99)
Potassium: 3.6 mmol/L (ref 3.5–5.1)
Sodium: 139 mmol/L (ref 135–145)
Total Bilirubin: 1.2 mg/dL (ref 0.3–1.2)
Total Protein: 7.8 g/dL (ref 6.5–8.1)

## 2019-06-22 LAB — URINALYSIS, ROUTINE W REFLEX MICROSCOPIC
Bilirubin Urine: NEGATIVE
Glucose, UA: NEGATIVE mg/dL
Ketones, ur: NEGATIVE mg/dL
Leukocytes,Ua: NEGATIVE
Nitrite: NEGATIVE
Protein, ur: NEGATIVE mg/dL
Specific Gravity, Urine: 1.015 (ref 1.005–1.030)
pH: 7 (ref 5.0–8.0)

## 2019-06-22 LAB — I-STAT BETA HCG BLOOD, ED (MC, WL, AP ONLY): I-stat hCG, quantitative: <5 m[IU]/mL (ref <5)

## 2019-06-22 LAB — LIPASE, BLOOD: Lipase: 26 U/L (ref 11–51)

## 2019-06-22 MED ORDER — LIDOCAINE 5 % EX PTCH
1.0000 | MEDICATED_PATCH | CUTANEOUS | Status: DC
  Administered 2019-06-22: 14:00:00 1 via TRANSDERMAL
  Filled 2019-06-22: qty 1

## 2019-06-22 MED ORDER — KETOROLAC TROMETHAMINE 30 MG/ML IJ SOLN
30.0000 mg | Freq: Once | INTRAMUSCULAR | Status: AC
  Administered 2019-06-22: 30 mg via INTRAVENOUS
  Filled 2019-06-22: qty 1

## 2019-06-22 MED ORDER — NAPROXEN 500 MG PO TABS: 500.0000 mg | ORAL_TABLET | Freq: Two times a day (BID) | ORAL | 0 refills | Status: DC

## 2019-06-22 NOTE — Discharge Instructions (Signed)
Your work-up today was very reassuring, likely musculoskeletal chest wall pain, take Naprosyn twice daily, you can take Tylenol in addition to this but do not combine with any other over-the-counter pain relievers, you can also use over-the-counter lidocaine patches or muscle rubs like Biofreeze.  If symptoms or not improving please follow-up with your primary doctor within the next few days if you have any worsening pain, shortness of breath, fevers cough or any other new or concerning symptoms please return for reevaluation.

## 2019-06-22 NOTE — ED Triage Notes (Signed)
Reports 2 days of upper left quad pain. No V/D/N noted, Also reports cramping with her period.

## 2019-06-22 NOTE — ED Provider Notes (Signed)
[facility]-EMERGENCY DEPT Provider Note   CSN: [id number] Arrival date & time: 06/22/19  1002     History   Chief Complaint Chief Complaint  Patient presents with  . left anterior rib pain  . Abdominal Pain    HPI Rebekah Rhodes is a 23 y.o. female.     Rebekah Rhodes is a 23 y.o. female with history of asthma eczema, anxiety and depression, who presents to the emergency department for evaluation over her left lower chest that extends into the left upper quadrant of the abdomen.  Pain has been present for 2 days.  It is worse with certain movements and palpation.  Pain is nonradiating.  He denies any associated shortness of breath, pain is not pleuritic.  No associated cough.  She denies any trauma or injury to the area.  No associated nausea, vomiting, diarrhea, blood in the stools.  She denies any flank pain, dysuria or urinary frequency.  She has not taken anything to treat this pain prior to arrival.  Denies previous history of the same.  No prior history of heart or lung issues.  No other aggravating or alleviating factors.  Estrogen-containing contraceptives, no history of PE or DVT, no lower extremity pain or swelling.  No recent travel or surgeries.     Past Medical History:  Diagnosis Date  . Anxiety   . Asthma   . Chlamydia   . Depression    was rx meds, has not picked them up  . Eczema   . Genital herpes   . HA (headache)   . Infection    UTI    Patient Active Problem List   Diagnosis Date Noted  . MDD (major depressive disorder), recurrent episode, severe ([facility]) 08/22/2018  . Bacterial vaginitis 05/16/2018  . Vaginal irritation 05/16/2018  . Cutaneous candidiasis 05/16/2018  . 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 Migraine 03/10/2014  . HA (headache)     Past Surgical History:  Procedure Laterality Date  . NO PAST SURGERIES    . None    . WISDOM TOOTH EXTRACTION       OB History    Gravida  0   Para  0   Term  0   Preterm  0   AB  0   Living  0     SAB  0   TAB  0   Ectopic  0   Multiple  0   Live Births               Home Medications    Prior to Admission medications   Medication Sig Start Date End Date Taking? Authorizing Provider  naproxen (NAPROSYN) 500 MG tablet Take 1 tablet (500 mg total) by mouth 2 (two) times daily. 06/22/19   Dartha LodgeFord, Truong Delcastillo N, PA-C  phenazopyridine (PYRIDIUM) 100 MG tablet Take 1 tablet (100 mg total) by mouth 3 (three) times daily. Patient not taking: Reported on 06/22/2019 09/13/18   Emi HolesLaw, Alexandra M, PA-C    Family History Family History  Problem Relation Age of Onset  . Breast cancer Mother   . Cancer Maternal Grandmother        breast; also great grandma    Social History Social History   Tobacco Use  . Smoking status: Never Smoker  . Smokeless tobacco: Never Used  Substance Use Topics  . Alcohol use: No  . Drug use: Yes    Frequency: 7.0 times per week  Types: Marijuana     Allergies   Amoxicillin and Penicillins   Review of Systems Review of Systems  Constitutional: Negative for chills and fever.  Respiratory: Negative for cough and shortness of breath.   Cardiovascular: Positive for chest pain.  Gastrointestinal: Positive for abdominal pain. Negative for constipation, diarrhea, nausea and vomiting.  Genitourinary: Negative for dysuria, flank pain and frequency.  Musculoskeletal: Negative for arthralgias and myalgias.  Skin: Negative for color change and rash.  Neurological: Negative for dizziness, syncope and light-headedness.  All other systems reviewed and are negative.    Physical Exam Updated Vital Signs BP 110/75 (BP Location: Left Arm)   Pulse 90   Temp 98.6 F (37 C) (Oral)   Resp 17   Ht 5\' 7"  (1.702 m)   SpO2 97%   BMI 18.45 kg/m   Physical Exam Vitals signs and nursing note reviewed.  Constitutional:      General: She is not in acute distress.    Appearance: She is well-developed and  normal weight. She is not ill-appearing or diaphoretic.  HENT:     Head: Normocephalic and atraumatic.     Mouth/Throat:     Mouth: Mucous membranes are moist.     Pharynx: Oropharynx is clear.  Eyes:     General:        Right eye: No discharge.        Left eye: No discharge.     Pupils: Pupils are equal, round, and reactive to light.  Neck:     Musculoskeletal: Neck supple.  Cardiovascular:     Rate and Rhythm: Normal rate and regular rhythm.     Heart sounds: Normal heart sounds. No murmur. No friction rub. No gallop.   Pulmonary:     Effort: Pulmonary effort is normal. No respiratory distress.     Breath sounds: Normal breath sounds. No wheezing or rales.  Chest:     Chest wall: Tenderness present.    Abdominal:     General: Bowel sounds are normal. There is no distension.     Palpations: Abdomen is soft. There is no mass.     Tenderness: There is abdominal tenderness. There is no guarding.     Comments: Abdomen soft and nondistended, bowel sounds present throughout, chest wall tenderness extends into the left upper quadrant with some mild tenderness present, no guarding or peritoneal signs, all other quadrants nontender patient.  No CVA tenderness.  Musculoskeletal:        General: No deformity.  Skin:    General: Skin is warm and dry.     Capillary Refill: Capillary refill takes less than 2 seconds.  Neurological:     Mental Status: She is alert and oriented to person, place, and time.     Coordination: Coordination normal.     Comments: Speech is clear, able to follow commands Moves extremities without ataxia, coordination intact  Psychiatric:        Mood and Affect: Mood normal.        Behavior: Behavior normal.      ED Treatments / Results  Labs (all labs ordered are listed, but only abnormal results are displayed) Labs Reviewed  CBC WITH DIFFERENTIAL/PLATELET - Abnormal; Notable for the following components:      Result Value   WBC 3.4 (*)    Neutro Abs  0.9 (*)    All other components within normal limits  URINALYSIS, ROUTINE W REFLEX MICROSCOPIC - Abnormal; Notable for the following components:   Hgb  urine dipstick LARGE (*)    Bacteria, UA RARE (*)    All other components within normal limits  COMPREHENSIVE METABOLIC PANEL  LIPASE, BLOOD  I-STAT BETA HCG BLOOD, ED ([facility], [facility], [facility] ONLY)    EKG EKG Interpretation  Date/Time:  Saturday June 22 2019 13:52:23 EDT Ventricular Rate:  60 PR Interval:    QRS Duration: 78 QT Interval:  398 QTC Calculation: 398 R Axis:   80 Text Interpretation:  Sinus rhythm Since last tracing QT has shortened and T wave abnormality resolved Confirmed by Daleen Bo [phone]) on 06/23/2019 4:11:55 PM   Radiology Dg Ribs Unilateral W/chest Left  Result Date: 06/22/2019 CLINICAL DATA:  Lower left anterior rib pain. EXAM: LEFT RIBS AND CHEST - 3+ VIEW COMPARISON:  11/28/2017 FINDINGS: Radio-opaque marker has been placed on the skin at the site of patient concern. No fracture or other bone lesions are seen involving the ribs. There is no evidence of pneumothorax or pleural effusion. Both lungs are clear. Heart size and mediastinal contours are within normal limits. IMPRESSION: Negative. Electronically Signed   By: Misty Stanley M.D.   On: 06/22/2019 14:39    Procedures Procedures (including critical care time)  Medications Ordered in ED Medications  lidocaine (LIDODERM) 5 % 1 patch (1 patch Transdermal Patch Applied 06/22/19 1409)  ketorolac (TORADOL) 30 MG/ML injection 30 mg (30 mg Intravenous Given 06/22/19 1409)     Initial Impression / Assessment and Plan / ED Course  I have reviewed the triage vital signs and the nursing notes.  Pertinent labs & imaging results that were available during my care of the patient were reviewed by me and considered in my medical decision making (see chart for details).  23 year old female presents for evaluation of pain over the left lower chest wall and left upper  quadrant.  Pain started yesterday, denies trauma or injury.  On arrival patient with normal vitals.  On exam pain is consistently reproducible with palpation of the left lower anterior lateral chest wall, no overlying ecchymosis or palpable deformity, no vesicular lesions to suggest shingles.  EKG shows normal sinus rhythm without any concerning changes.  Chest x-ray with dedicated rib views shows no evidence of rib fracture, pneumonia or other active cardiopulmonary disease.  Given that pain is also mildly present in the left upper quadrant of the abdomen, labs ordered which are very reassuring, no leukocytosis, no acute electrolyte derangements, normal renal and liver function, normal lipase and urinalysis without signs of infection.  Patient has not had any associated nausea vomiting or diarrhea, and does not have any peritoneal signs, do not think that abdominal imaging is indicated at this time.  Doubt PE as patient is PERC negative, and pain is repeatedly reproducible, suggestive of musculoskeletal chest wall pain, will treat with NSAIDs, lidocaine patches.  PCP follow-up encouraged.  Strict return precautions discussed.  Patient expresses understanding and agreement with plan.  Discharged home in good condition.  Final Clinical Impressions(s) / ED Diagnoses   Final diagnoses:  Chest wall pain    ED Discharge Orders         Ordered    naproxen (NAPROSYN) 500 MG tablet  2 times daily     06/22/19 1517           Jacqlyn Larsen, [place] 06/23/19 1737    Lucrezia Starch, MD 06/24/19 1322

## 2019-06-22 NOTE — ED Notes (Signed)
Urine culture sent with UA sample if needed 

## 2019-06-22 NOTE — ED Notes (Signed)
Patient transported to X-ray 

## 2019-08-14 ENCOUNTER — Other Ambulatory Visit: Payer: Self-pay | Admitting: Emergency Medicine

## 2019-08-14 ENCOUNTER — Telehealth: Payer: Medicaid Other

## 2019-08-14 ENCOUNTER — Telehealth: Payer: Self-pay

## 2019-08-14 DIAGNOSIS — A6004 Herpesviral vulvovaginitis: Secondary | ICD-10-CM

## 2019-08-14 DIAGNOSIS — B009 Herpesviral infection, unspecified: Secondary | ICD-10-CM

## 2019-08-14 MED ORDER — VALACYCLOVIR HCL 1 G PO TABS: 1000.0000 mg | ORAL_TABLET | Freq: Two times a day (BID) | ORAL | 3 refills | Status: DC

## 2019-08-14 NOTE — Telephone Encounter (Signed)
Pt called 2 times to ask for med refill. Pt reports she has a history of HSV. Today she is having an outbreak of HSV orally and on her genitals. Valtrex ordered per protocol for GYN Patients.   Advised pt that she is due for her yearly exam as it has been almost 2 years. Sent message to front desk to have them call and schedule pt to come in for her yearly exam.

## 2019-08-14 NOTE — Addendum Note (Signed)
Addended by: Donn Pierini on: 08/14/2019 03:40 PM   Modules accepted: Orders

## 2019-08-14 NOTE — Telephone Encounter (Signed)
Pt called thru Nurse line regarding a Rx, did not give name, wanted call back. Called pt back, no answer, left VM for her to call back with more info on what she was needing.

## 2019-08-14 NOTE — Telephone Encounter (Signed)
Pt called and left a message on the nurse voicemail line stating that she needed a prescription refill for outbreaks that she occasionally has.

## 2019-09-03 ENCOUNTER — Ambulatory Visit: Payer: Medicaid Other | Admitting: Advanced Practice Midwife

## 2019-10-18 IMAGING — CR LEFT RIBS AND CHEST - 3+ VIEW
3 series · 3 of 3 positions shown · non-contrast
Comparison: 11/28/2017

CLINICAL DATA: Lower left anterior rib pain.

EXAM:
LEFT RIBS AND CHEST - 3+ VIEW

[w chest pa]
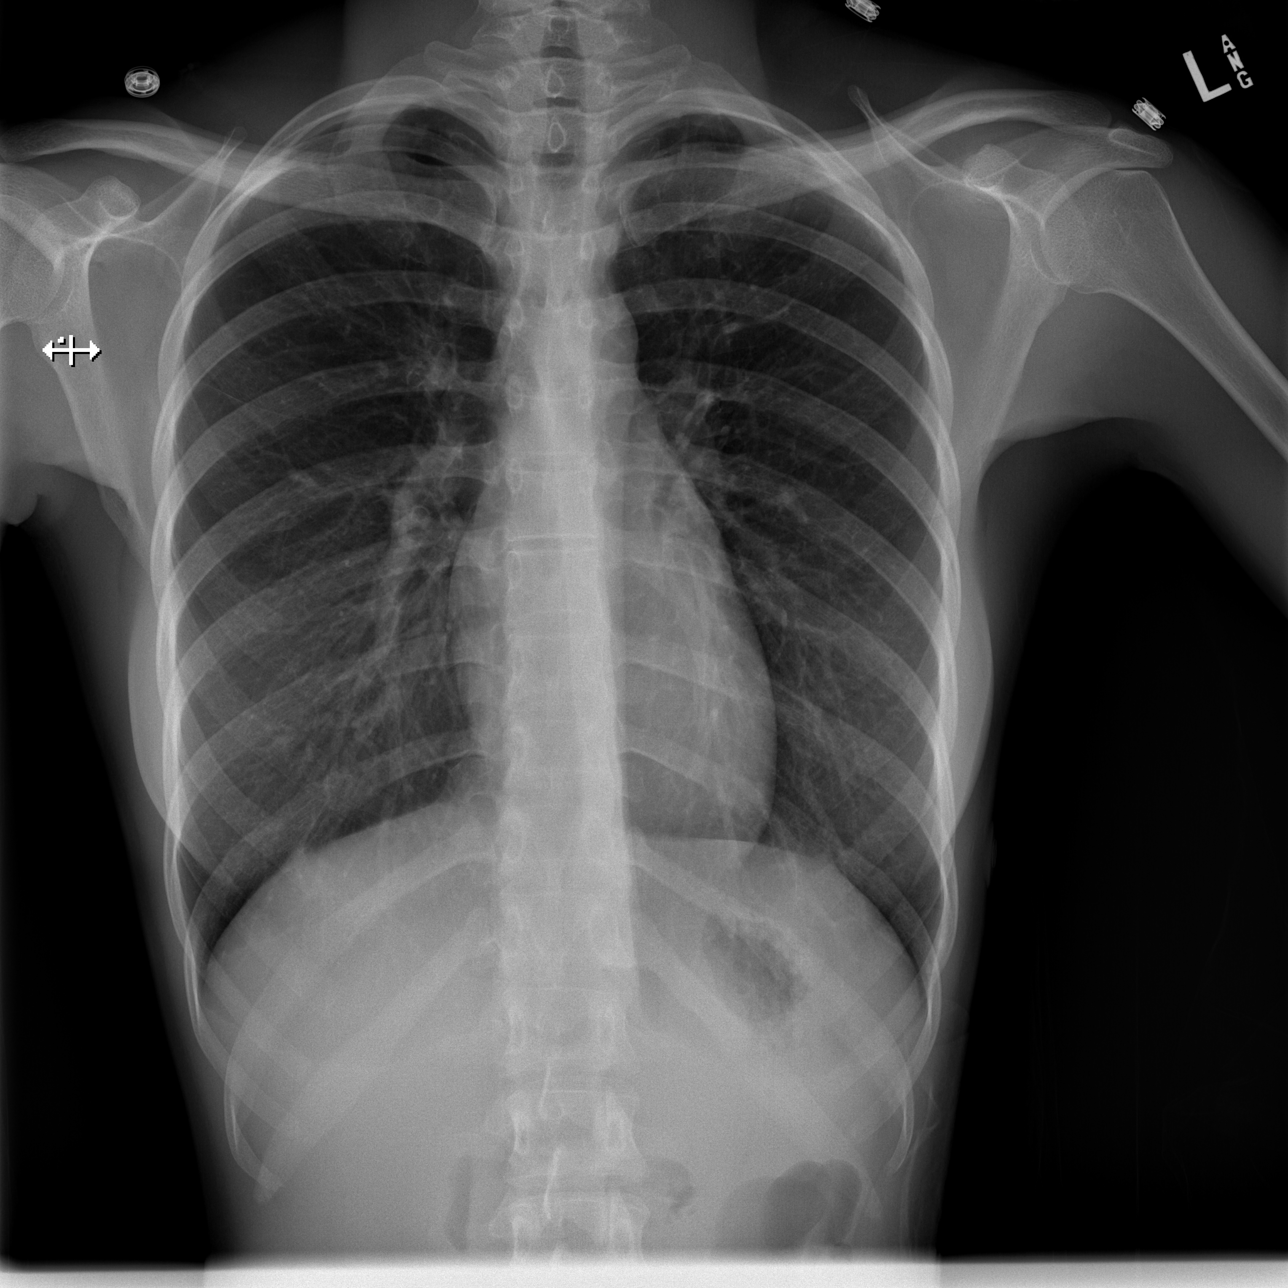

[w ribs ap upper left]
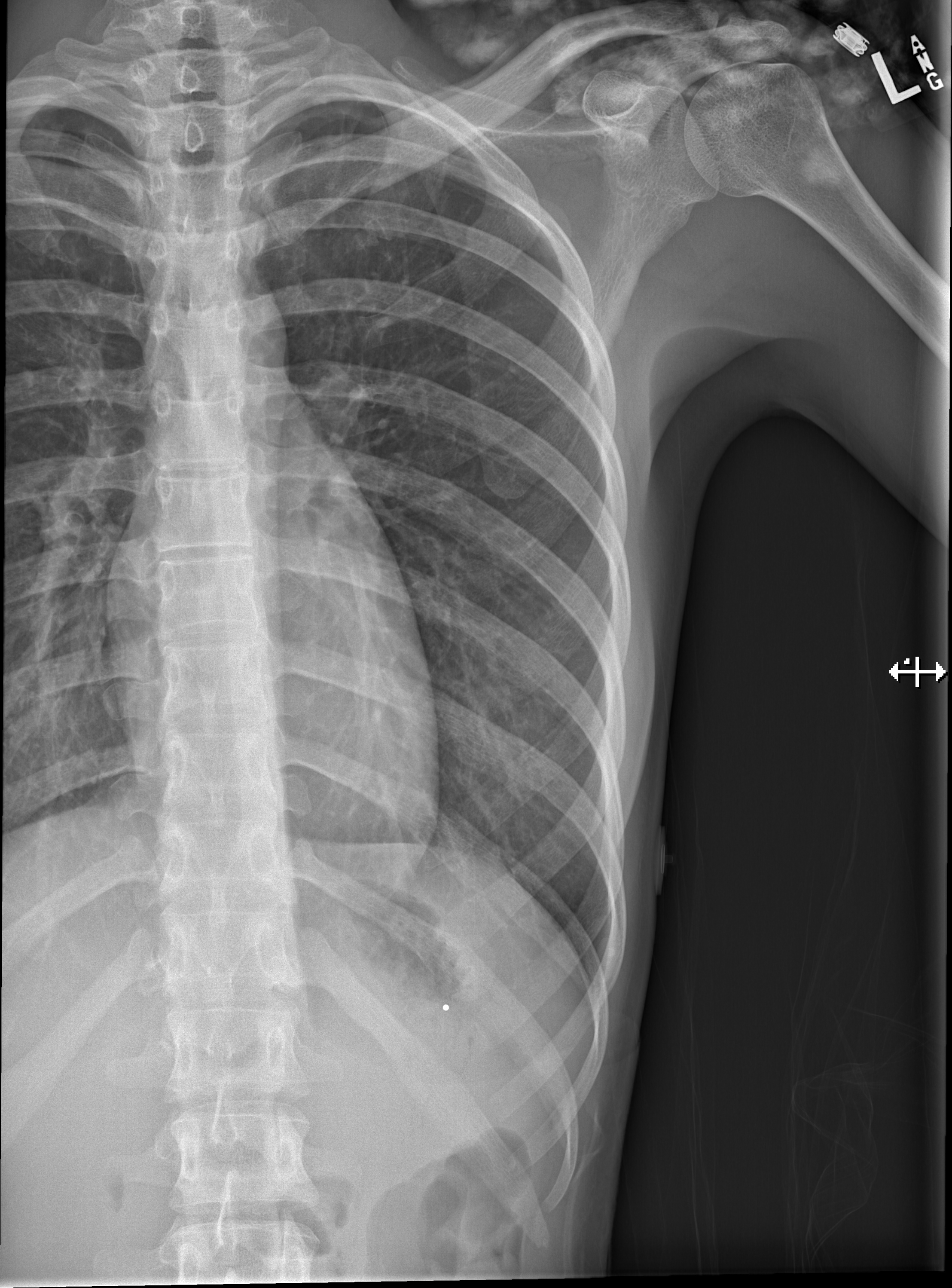

[w ribs obl left]
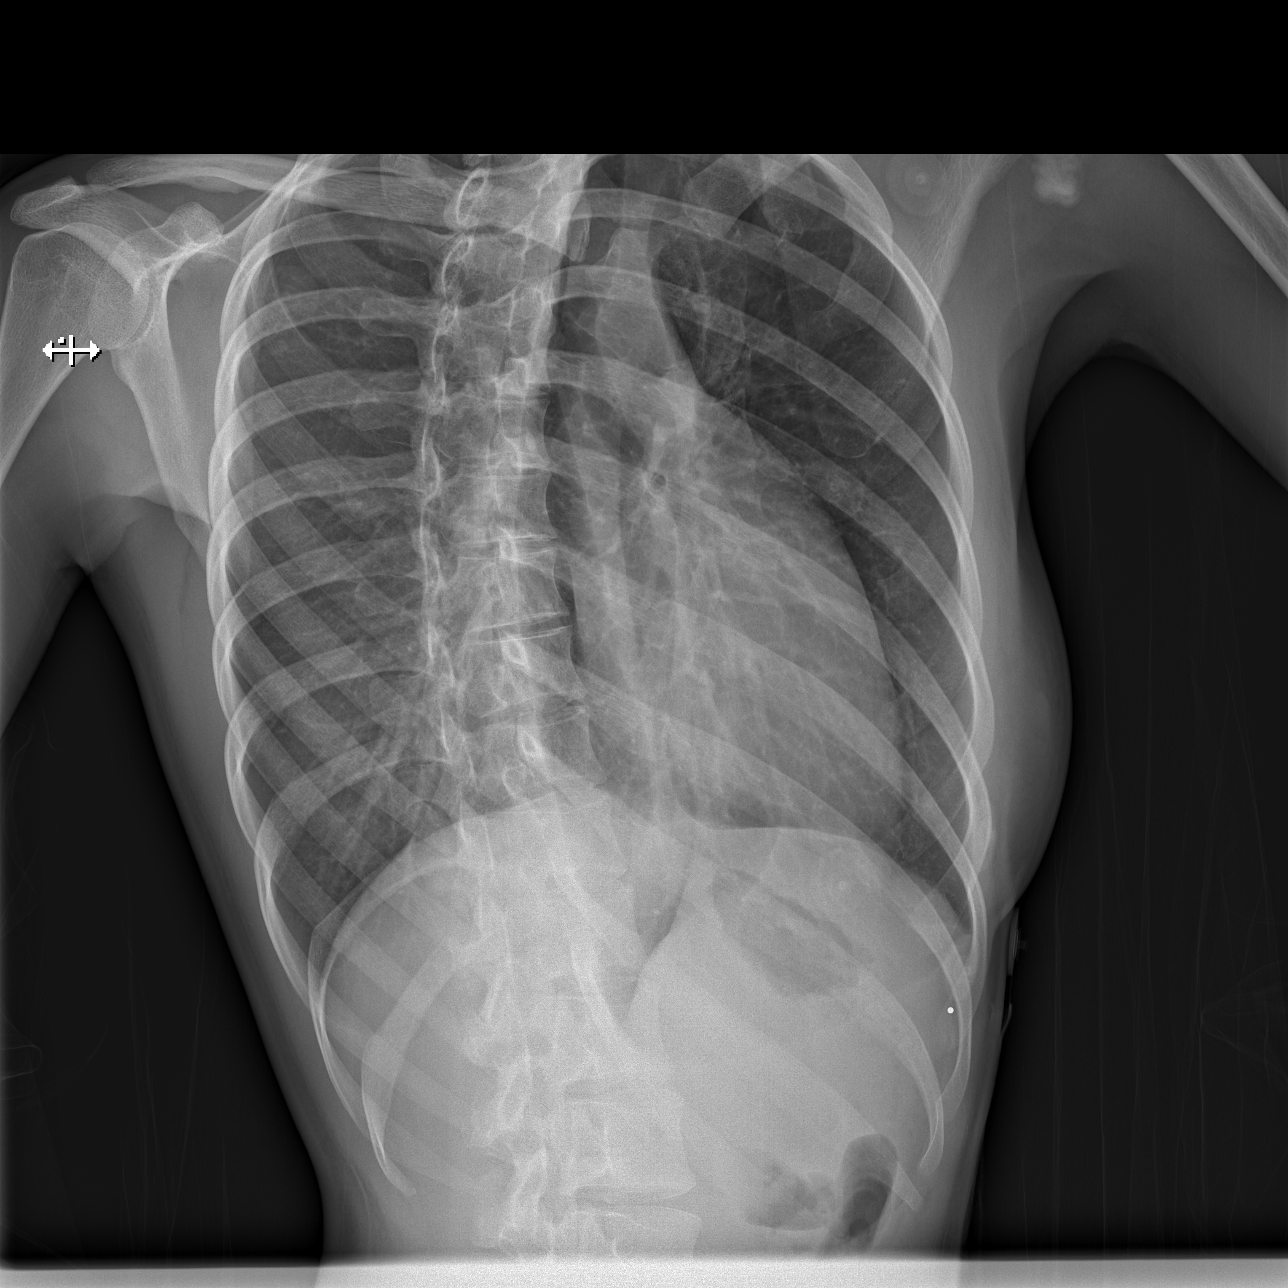

[3 of 3 positions shown; findings below may reference images not displayed]

FINDINGS: Radio-opaque marker has been placed on the skin at the site of
patient concern. No fracture or other bone lesions are seen
involving the ribs. There is no evidence of pneumothorax or pleural
effusion. Both lungs are clear. Heart size and mediastinal contours
are within normal limits.
IMPRESSION: Negative.

## 2020-04-15 ENCOUNTER — Emergency Department (HOSPITAL_COMMUNITY)
Admission: EM | Admit: 2020-04-15 | Discharge: 2020-04-15 | Disposition: A | Discharge status: Home / Self Care | Payer: Medicaid Other | Attending: Emergency Medicine | Admitting: Emergency Medicine

## 2020-04-15 ENCOUNTER — Encounter (HOSPITAL_COMMUNITY): Payer: Self-pay

## 2020-04-15 ENCOUNTER — Other Ambulatory Visit: Payer: Self-pay

## 2020-04-15 DIAGNOSIS — Z791 Long term (current) use of non-steroidal anti-inflammatories (NSAID): Secondary | ICD-10-CM | POA: Insufficient documentation

## 2020-04-15 DIAGNOSIS — R05 Cough: Secondary | ICD-10-CM | POA: Insufficient documentation

## 2020-04-15 DIAGNOSIS — Z880 Allergy status to penicillin: Secondary | ICD-10-CM | POA: Insufficient documentation

## 2020-04-15 DIAGNOSIS — J029 Acute pharyngitis, unspecified: Secondary | ICD-10-CM | POA: Insufficient documentation

## 2020-04-15 DIAGNOSIS — J45909 Unspecified asthma, uncomplicated: Secondary | ICD-10-CM | POA: Insufficient documentation

## 2020-04-15 DIAGNOSIS — Z20822 Contact with and (suspected) exposure to covid-19: Secondary | ICD-10-CM | POA: Insufficient documentation

## 2020-04-15 DIAGNOSIS — J069 Acute upper respiratory infection, unspecified: Secondary | ICD-10-CM | POA: Insufficient documentation

## 2020-04-15 LAB — SARS CORONAVIRUS 2 (TAT 6-24 HRS): SARS Coronavirus 2: NEGATIVE

## 2020-04-15 MED ORDER — BENZONATATE 100 MG PO CAPS: 100.0000 mg | ORAL_CAPSULE | Freq: Three times a day (TID) | ORAL | 0 refills | Status: DC

## 2020-04-15 MED ORDER — FLUTICASONE PROPIONATE 50 MCG/ACT NA SUSP: 2.0000 | Freq: Every day | NASAL | 0 refills | Status: DC

## 2020-04-15 MED ORDER — PROMETHAZINE-DM 6.25-15 MG/5ML PO SYRP: 2.5000 mL | ORAL_SOLUTION | Freq: Every evening | ORAL | 0 refills | Status: DC

## 2020-04-15 NOTE — ED Provider Notes (Addendum)
[facility] DEPT Provider Note   CSN: [id number] Arrival date & time: 04/15/20  1031     History Chief Complaint  Patient presents with  . Sore Throat  . Nasal Congestion    Rebekah Rhodes is a 24 y.o. female.  HPI  Patient is a 24 year old female with 3 days of cough, congestion, sore throat, chills without fever, general fatigue.  She denies any nausea, vomiting, lightheadedness.  She states that she does occasionally cough so hard that she feels like she has no vomit however this is transient.  She denies any fevers.  Denies any neck pain or headache.  She states that she has had sinus congestion and productive of clear mucus.  She states that she will cough up some yellow to clear phlegm occasionally.  No hemoptysis.  She denies any shortness of breath or chest pain.  She has taken some ibuprofen without significant relief.  No aggravating or mitigating factors of her symptoms.     Past Medical History:  Diagnosis Date  . Anxiety   . Asthma   . Chlamydia   . Depression    was rx meds, has not picked them up  . Eczema   . Genital herpes   . HA (headache)   . Infection    UTI    Patient Active Problem List   Diagnosis Date Noted  . MDD (major depressive disorder), recurrent episode, severe ([place]) 08/22/2018  . Bacterial vaginitis 05/16/2018  . Vaginal irritation 05/16/2018  . Cutaneous candidiasis 05/16/2018  . 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 Migraine 03/10/2014  . HA (headache)     Past Surgical History:  Procedure Laterality Date  . NO PAST SURGERIES    . None    . WISDOM TOOTH EXTRACTION       OB History    Gravida  0   Para  0   Term  0   Preterm  0   AB  0   Living  0     SAB  0   TAB  0   Ectopic  0   Multiple  0   Live Births              Family History  Problem Relation Age of Onset  . Breast cancer Mother   . Cancer Maternal Grandmother        breast; also great  grandma    Social History   Tobacco Use  . Smoking status: Never Smoker  . Smokeless tobacco: Never Used  Vaping Use  . Vaping Use: Never used  Substance Use Topics  . Alcohol use: No  . Drug use: Yes    Frequency: 7.0 times per week    Types: Marijuana    Home Medications Prior to Admission medications   Medication Sig Start Date End Date Taking? Authorizing Provider  benzonatate (TESSALON) 100 MG capsule Take 1 capsule (100 mg total) by mouth every 8 (eight) hours. 04/15/20   Marca Gadsby, Kathleene Hazel, PA  fluticasone (FLONASE) 50 MCG/ACT nasal spray Place 2 sprays into both nostrils daily for 14 days. 04/15/20 04/29/20  Tedd Sias, PA  naproxen (NAPROSYN) 500 MG tablet Take 1 tablet (500 mg total) by mouth 2 (two) times daily. 06/22/19   Jacqlyn Larsen, PA-C  phenazopyridine (PYRIDIUM) 100 MG tablet Take 1 tablet (100 mg total) by mouth 3 (three) times daily. Patient not taking: Reported on 06/22/2019 09/13/18   Frederica Kuster, PA-C  promethazine-dextromethorphan (PROMETHAZINE-DM) 6.25-15 MG/5ML syrup Take 2.5 mLs by mouth at bedtime. 04/15/20   Gailen Shelter, PA  valACYclovir (VALTREX) 1000 MG tablet Take 1 tablet (1,000 mg total) by mouth 2 (two) times daily. 08/14/19   Marny Lowenstein, PA-C    Allergies    Amoxicillin and Penicillins  Review of Systems   Review of Systems  Constitutional: Positive for chills and fatigue. Negative for fever.  HENT: Positive for congestion and sore throat.   Eyes: Negative for pain.  Respiratory: Positive for cough. Negative for shortness of breath.   Cardiovascular: Negative for chest pain and leg swelling.  Gastrointestinal: Negative for abdominal pain, diarrhea and vomiting.  Genitourinary: Negative for dysuria.  Musculoskeletal: Negative for myalgias.  Skin: Negative for rash.  Neurological: Negative for dizziness and headaches.    Physical Exam Updated Vital Signs BP 109/80   Pulse 82   Temp 98.3 F (36.8 C) (Oral)   Resp 16    LMP 02/29/2020   SpO2 100%   Physical Exam Vitals and nursing note reviewed.  Constitutional:      General: She is not in acute distress. HENT:     Head: Normocephalic and atraumatic.     Nose: Nose normal.     Mouth/Throat:     Mouth: Mucous membranes are moist.     Comments: Moist oral mucosa.  No tonsillar hypertrophy or exudates.  No posterior pharynx erythema. Eyes:     General: No scleral icterus. Cardiovascular:     Rate and Rhythm: Normal rate and regular rhythm.     Pulses: Normal pulses.     Heart sounds: Normal heart sounds.  Pulmonary:     Effort: Pulmonary effort is normal. No respiratory distress.     Breath sounds: No wheezing.     Comments: Lungs are clear in all fields.  No increased work of breathing. Abdominal:     Palpations: Abdomen is soft.     Tenderness: There is no abdominal tenderness. There is no guarding or rebound.  Musculoskeletal:     Cervical back: Normal range of motion.     Right lower leg: No edema.     Left lower leg: No edema.  Skin:    General: Skin is warm and dry.     Capillary Refill: Capillary refill takes less than 2 seconds.  Neurological:     Mental Status: She is alert. Mental status is at baseline.  Psychiatric:        Mood and Affect: Mood normal.        Behavior: Behavior normal.     ED Results / Procedures / Treatments   Labs (all labs ordered are listed, but only abnormal results are displayed) Labs Reviewed  SARS CORONAVIRUS 2 (TAT 6-24 HRS)    EKG None  Radiology No results found.  Procedures Procedures (including critical care time)  Medications Ordered in ED Medications - No data to display  ED Course  I have reviewed the triage vital signs and the nursing notes.  Pertinent labs & imaging results that were available during my care of the patient were reviewed by me and considered in my medical decision making (see chart for details).    MDM Rules/Calculators/A&P                           Patient is healthy 24 year old female presented today with symptoms of URI including cough, congestion, sinus drainage and fatigue.  She denies any  chest pain, nausea, vomiting, fevers.  She is well-appearing physical exam she does appear to have some congestion but nothing significant.  She does cough occasionally during my examination.  Lungs are clear in all fields.  Vital signs are normal with SPO2 100% on room air and normal pulse rate.  Patient discharged with conservative therapy for her viral URI.  Fluticasone, Promethazine DM for nighttime cough, Tessalon Perles for daytime use.  Also given Tylenol, ibuprofen, fluid and food recommendations.  Given return precautions.  She will follow up with his PCP.  Rebekah Rhodes was evaluated in Emergency Department on 04/15/2020 for the symptoms described in the history of present illness. She was evaluated in the context of the global COVID-19 pandemic, which necessitated consideration that the patient might be at risk for infection with the SARS-CoV-2 virus that causes COVID-19. Institutional protocols and algorithms that pertain to the evaluation of patients at risk for COVID-19 are in a state of rapid change based on information released by regulatory bodies including the CDC and federal and state organizations. These policies and algorithms were followed during the patient's care in the ED.  Patient's Covid test is pending.  Provided with work note until her test results.  Final Clinical Impression(s) / ED Diagnoses Final diagnoses:  Viral URI with cough    Rx / DC Orders ED Discharge Orders         Ordered    fluticasone (FLONASE) 50 MCG/ACT nasal spray  Daily     Discontinue  Reprint     04/15/20 1211    promethazine-dextromethorphan (PROMETHAZINE-DM) 6.25-15 MG/5ML syrup  Nightly     Discontinue  Reprint     04/15/20 1211    benzonatate (TESSALON) 100 MG capsule  Every 8 hours     Discontinue  Reprint     04/15/20 1211            Gailen Shelter, PA 04/15/20 1216    Solon Augusta [place], [place] 04/15/20 1216    Gerhard Munch, MD 04/16/20 2238

## 2020-04-15 NOTE — ED Triage Notes (Signed)
C/O nasal congestion, sore throat, coughing, and headache.   Sore throat pain 7/10   A/Ox4 Ambulatory in triage.  Patient able to speak in complete sentences with no problems noted.

## 2020-04-15 NOTE — Discharge Instructions (Addendum)
As we discussed please use Benadryl, Zyrtec and pseudoephedrine for congestion.  Also highly recommend nasal rinses with Nettie pot or other such product.  These can be bought at [facility], [address] or [address]. Also recommend mentholated cough drops.  I have prescribed you fluticasone, benzonatate and a cough syrup to use at night  As we discussed I highly recommend that you get vaccinated for COVID-19 as soon as possible.  I recommend doing this when you are feeling improved from your current symptoms.  Your COVID test is pending; you should expect results in 2-3 days. You can access your results on your MyChart--if you test positive you should receive a phone call.  In the meantime follow CDC guidelines and quarantine, wear a mask, wash hands often.   Please take over the counter vitamin D 2000-4000 units per day. I also recommend zinc 50 mg per day for the next two weeks.   Please return to ED if you feel have difficulty breathing or have emergent, new or concerning symptoms.  Patients who have symptoms consistent with COVID-19 should self isolated for: At least 3 days (72 hours) have passed since recovery, defined as resolution of fever without the use of fever reducing medications and improvement in respiratory symptoms (e.g., cough, shortness of breath), and At least 7 days have passed since symptoms first appeared.       Person Under Monitoring Name: Rebekah Rhodes  Location: [address] [place] [address]   Infection Prevention Recommendations for Individuals Confirmed to have, or Being Evaluated for, 2019 Novel Coronavirus (COVID-19) Infection Who Receive Care at Home  Individuals who are confirmed to have, or are being evaluated for, COVID-19 should follow the prevention steps below until a healthcare provider or local or state health department says they can return to normal activities.  Stay home except to get medical care You should restrict activities  outside your home, except for getting medical care. Do not go to work, school, or public areas, and do not use public transportation or taxis.  Call ahead before visiting your doctor Before your medical appointment, call the healthcare provider and tell them that you have, or are being evaluated for, COVID-19 infection. This will help the healthcare provider's office take steps to keep other people from getting infected. Ask your healthcare provider to call the local or state health department.  Monitor your symptoms Seek prompt medical attention if your illness is worsening (e.g., difficulty breathing). Before going to your medical appointment, call the healthcare provider and tell them that you have, or are being evaluated for, COVID-19 infection. Ask your healthcare provider to call the local or state health department.  Wear a facemask You should wear a facemask that covers your nose and mouth when you are in the same room with other people and when you visit a healthcare provider. People who live with or visit you should also wear a facemask while they are in the same room with you.  Separate yourself from other people in your home As much as possible, you should stay in a different room from other people in your home. Also, you should use a separate bathroom, if available.  Avoid sharing household items You should not share dishes, drinking glasses, cups, eating utensils, towels, bedding, or other items with other people in your home. After using these items, you should wash them thoroughly with soap and water.  Cover your coughs and sneezes Cover your mouth and nose with a tissue  when you cough or sneeze, or you can cough or sneeze into your sleeve. Throw used tissues in a lined trash can, and immediately wash your hands with soap and water for at least 20 seconds or use an alcohol-based hand rub.  Wash your [facility] your hands often and thoroughly with soap and water for at  least 20 seconds. You can use an alcohol-based hand sanitizer if soap and water are not available and if your hands are not visibly dirty. Avoid touching your eyes, nose, and mouth with unwashed hands.   Prevention Steps for Caregivers and Household Members of Individuals Confirmed to have, or Being Evaluated for, COVID-19 Infection Being Cared for in the Home  If you live with, or provide care at home for, a person confirmed to have, or being evaluated for, COVID-19 infection please follow these guidelines to prevent infection:  Follow healthcare provider's instructions Make sure that you understand and can help the patient follow any healthcare provider instructions for all care.  Provide for the patient's basic needs You should help the patient with basic needs in the home and provide support for getting groceries, prescriptions, and other personal needs.  Monitor the patient's symptoms If they are getting sicker, call his or her medical provider and tell them that the patient has, or is being evaluated for, COVID-19 infection. This will help the healthcare provider's office take steps to keep other people from getting infected. Ask the healthcare provider to call the local or state health department.  Limit the number of people who have contact with the patient If possible, have only one caregiver for the patient. Other household members should stay in another home or place of residence. If this is not possible, they should stay in another room, or be separated from the patient as much as possible. Use a separate bathroom, if available. Restrict visitors who do not have an essential need to be in the home.  Keep older adults, very young children, and other sick people away from the patient Keep older adults, very young children, and those who have compromised immune systems or chronic health conditions away from the patient. This includes people with chronic heart, lung, or kidney  conditions, diabetes, and cancer.  Ensure good ventilation Make sure that shared spaces in the home have good air flow, such as from an air conditioner or an opened window, weather permitting.  Wash your hands often Wash your hands often and thoroughly with soap and water for at least 20 seconds. You can use an alcohol based hand sanitizer if soap and water are not available and if your hands are not visibly dirty. Avoid touching your eyes, nose, and mouth with unwashed hands. Use disposable paper towels to dry your hands. If not available, use dedicated cloth towels and replace them when they become wet.  Wear a facemask and gloves Wear a disposable facemask at all times in the room and gloves when you touch or have contact with the patient's blood, body fluids, and/or secretions or excretions, such as sweat, saliva, sputum, nasal mucus, vomit, urine, or feces.  Ensure the mask fits over your nose and mouth tightly, and do not touch it during use. Throw out disposable facemasks and gloves after using them. Do not reuse. Wash your hands immediately after removing your facemask and gloves. If your personal clothing becomes contaminated, carefully remove clothing and launder. Wash your hands after handling contaminated clothing. Place all used disposable facemasks, gloves, and other waste in a  lined container before disposing them with other household waste. Remove gloves and wash your hands immediately after handling these items.  Do not share dishes, glasses, or other household items with the patient Avoid sharing household items. You should not share dishes, drinking glasses, cups, eating utensils, towels, bedding, or other items with a patient who is confirmed to have, or being evaluated for, COVID-19 infection. After the person uses these items, you should wash them thoroughly with soap and water.  Wash laundry thoroughly Immediately remove and wash clothes or bedding that have blood, body  fluids, and/or secretions or excretions, such as sweat, saliva, sputum, nasal mucus, vomit, urine, or feces, on them. Wear gloves when handling laundry from the patient. Read and follow directions on labels of laundry or clothing items and detergent. In general, wash and dry with the warmest temperatures recommended on the label.  Clean all areas the individual has used often Clean all touchable surfaces, such as counters, tabletops, doorknobs, bathroom fixtures, toilets, phones, keyboards, tablets, and bedside tables, every day. Also, clean any surfaces that may have blood, body fluids, and/or secretions or excretions on them. Wear gloves when cleaning surfaces the patient has come in contact with. Use a diluted bleach solution (e.g., dilute bleach with 1 part bleach and 10 parts water) or a household disinfectant with a label that says EPA-registered for coronaviruses. To make a bleach solution at home, add 1 tablespoon of bleach to 1 quart (4 cups) of water. For a larger supply, add  cup of bleach to 1 gallon (16 cups) of water. Read labels of cleaning products and follow recommendations provided on product labels. Labels contain instructions for safe and effective use of the cleaning product including precautions you should take when applying the product, such as wearing gloves or eye protection and making sure you have good ventilation during use of the product. Remove gloves and wash hands immediately after cleaning.  Monitor yourself for signs and symptoms of illness Caregivers and household members are considered close contacts, should monitor their health, and will be asked to limit movement outside of the home to the extent possible. Follow the monitoring steps for close contacts listed on the symptom monitoring form.   ? If you have additional questions, contact your local health department or call the epidemiologist on call at [phone] (available 24/7). ? This guidance is subject  to change. For the most up-to-date guidance from [facility], please refer to their website: [email or web address]

## 2020-06-07 ENCOUNTER — Other Ambulatory Visit: Payer: Self-pay

## 2020-06-07 ENCOUNTER — Encounter (HOSPITAL_COMMUNITY): Payer: Self-pay

## 2020-06-07 DIAGNOSIS — U071 COVID-19: Secondary | ICD-10-CM | POA: Insufficient documentation

## 2020-06-07 DIAGNOSIS — J45909 Unspecified asthma, uncomplicated: Secondary | ICD-10-CM | POA: Diagnosis not present

## 2020-06-07 DIAGNOSIS — Z7951 Long term (current) use of inhaled steroids: Secondary | ICD-10-CM | POA: Insufficient documentation

## 2020-06-07 DIAGNOSIS — R439 Unspecified disturbances of smell and taste: Secondary | ICD-10-CM | POA: Diagnosis present

## 2020-06-07 NOTE — ED Triage Notes (Signed)
Pt reports chills starting yesterday and loss of taste and smell starting today. Denies cough. Has not been vaccinated or had COVID.

## 2020-06-08 ENCOUNTER — Emergency Department (HOSPITAL_COMMUNITY)
Admission: EM | Admit: 2020-06-08 | Discharge: 2020-06-08 | Disposition: A | Discharge status: Home / Self Care | Payer: HRSA Program | Attending: Emergency Medicine | Admitting: Emergency Medicine

## 2020-06-08 DIAGNOSIS — Z20822 Contact with and (suspected) exposure to covid-19: Secondary | ICD-10-CM

## 2020-06-08 LAB — SARS CORONAVIRUS 2 BY RT PCR (HOSPITAL ORDER, PERFORMED IN ~~LOC~~ HOSPITAL LAB): SARS Coronavirus 2: POSITIVE — AB

## 2020-06-08 MED ORDER — BUTALBITAL-APAP-CAFFEINE 50-325-40 MG PO TABS: 1.0000 | ORAL_TABLET | Freq: Three times a day (TID) | ORAL | 0 refills | Status: DC | PRN

## 2020-06-08 NOTE — ED Provider Notes (Signed)
[facility]-EMERGENCY DEPT Provider Note   CSN: [id number] Arrival date & time: 06/07/20  2145     History Chief Complaint  Patient presents with  . Loss of Taste    Rebekah Rhodes is a 24 y.o. female.   24 year old female presents to the emergency department for evaluation of upper respiratory symptoms.  She reports congestion as well as sinus pressure, sinus headache, and chills which began yesterday.  This morning, she noticed loss of smell and taste.  She has no cough, shortness of breath, fevers, vomiting, diarrhea.  Unsure of any sick contacts.  Has not been vaccinated for Covid nor wears a mask when in public.  The history is provided by the patient. No language interpreter was used.       Past Medical History:  Diagnosis Date  . Anxiety   . Asthma   . Chlamydia   . Depression    was rx meds, has not picked them up  . Eczema   . Genital herpes   . HA (headache)   . Infection    UTI    Patient Active Problem List   Diagnosis Date Noted  . MDD (major depressive disorder), recurrent episode, severe (HCC) 08/22/2018  . Bacterial vaginitis 05/16/2018  . Vaginal irritation 05/16/2018  . Cutaneous candidiasis 05/16/2018  . 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 Migraine 03/10/2014  . HA (headache)     Past Surgical History:  Procedure Laterality Date  . NO PAST SURGERIES    . None    . WISDOM TOOTH EXTRACTION       OB History    Gravida  0   Para  0   Term  0   Preterm  0   AB  0   Living  0     SAB  0   TAB  0   Ectopic  0   Multiple  0   Live Births              Family History  Problem Relation Age of Onset  . Breast cancer Mother   . Cancer Maternal Grandmother        breast; also great grandma    Social History   Tobacco Use  . Smoking status: Never Smoker  . Smokeless tobacco: Never Used  Vaping Use  . Vaping Use: Never used  Substance Use Topics  . Alcohol use: No  . Drug use:  Yes    Frequency: 7.0 times per week    Types: Marijuana    Home Medications Prior to Admission medications   Medication Sig Start Date End Date Taking? Authorizing Provider  benzonatate (TESSALON) 100 MG capsule Take 1 capsule (100 mg total) by mouth every 8 (eight) hours. 04/15/20   Gailen Shelter, PA  butalbital-acetaminophen-caffeine (FIORICET) [phone] MG tablet Take 1-2 tablets by mouth every 8 (eight) hours as needed for headache. 06/08/20 06/08/21  Antony Madura, PA-C  fluticasone (FLONASE) 50 MCG/ACT nasal spray Place 2 sprays into both nostrils daily for 14 days. 04/15/20 04/29/20  Gailen Shelter, PA  naproxen (NAPROSYN) 500 MG tablet Take 1 tablet (500 mg total) by mouth 2 (two) times daily. 06/22/19   Dartha Lodge, PA-C  phenazopyridine (PYRIDIUM) 100 MG tablet Take 1 tablet (100 mg total) by mouth 3 (three) times daily. Patient not taking: Reported on 06/22/2019 09/13/18   Emi Holes, PA-C  promethazine-dextromethorphan (PROMETHAZINE-DM) 6.25-15 MG/5ML syrup Take 2.5 mLs by mouth at  bedtime. 04/15/20   Gailen Shelter, PA  valACYclovir (VALTREX) 1000 MG tablet Take 1 tablet (1,000 mg total) by mouth 2 (two) times daily. 08/14/19   Marny Lowenstein, PA-C    Allergies    Amoxicillin and Penicillins  Review of Systems   Review of Systems  Ten systems reviewed and are negative for acute change, except as noted in the HPI.    Physical Exam Updated Vital Signs BP 115/77 (BP Location: Right Arm)   Pulse (!) 59   Temp 98 F (36.7 C)   Resp 16   Ht 5\' 7"  (1.702 m)   Wt 54.4 kg   SpO2 100%   BMI 18.79 kg/m   Physical Exam Vitals and nursing note reviewed.  Constitutional:      General: She is not in acute distress.    Appearance: She is well-developed. She is not diaphoretic.     Comments: Nontoxic-appearing and in no distress  HENT:     Head: Normocephalic and atraumatic.  Eyes:     General: No scleral icterus.    Conjunctiva/sclera: Conjunctivae normal.    Cardiovascular:     Rate and Rhythm: Normal rate and regular rhythm.     Pulses: Normal pulses.  Pulmonary:     Effort: Pulmonary effort is normal. No respiratory distress.     Breath sounds: No wheezing, rhonchi or rales.     Comments: Respirations even and unlabored Musculoskeletal:        General: Normal range of motion.     Cervical back: Normal range of motion.  Skin:    General: Skin is warm and dry.     Coloration: Skin is not pale.     Findings: No erythema or rash.  Neurological:     Mental Status: She is alert and oriented to person, place, and time.  Psychiatric:        Behavior: Behavior normal.     ED Results / Procedures / Treatments   Labs (all labs ordered are listed, but only abnormal results are displayed) Labs Reviewed  SARS CORONAVIRUS 2 BY RT PCR (HOSPITAL ORDER, PERFORMED IN [facility]) - Abnormal; Notable for the following components:      Result Value   SARS Coronavirus 2 POSITIVE (*)    All other components within normal limits    EKG None  Radiology No results found.  Procedures Procedures (including critical care time)  Medications Ordered in ED Medications - No data to display  ED Course  I have reviewed the triage vital signs and the nursing notes.  Pertinent labs & imaging results that were available during my care of the patient were reviewed by me and considered in my medical decision making (see chart for details).    MDM Rules/Calculators/A&P                          24 year old female presents to the emergency department for evaluation of symptoms suspicious for COVID-19 infection.  She has no complaints of shortness of breath or cough.  Patient afebrile in the ED with stable vital signs.  No hypoxia or signs of respiratory distress.  We will continue with outpatient symptomatic management.  Return precautions discussed and provided.  Patient discharged in stable condition with no unaddressed concerns.  DANY HARTEN was evaluated in Emergency Department on 06/08/2020 for the symptoms described in the history of present illness. She was evaluated in the context of the  global COVID-19 pandemic, which necessitated consideration that the patient might be at risk for infection with the SARS-CoV-2 virus that causes COVID-19. Institutional protocols and algorithms that pertain to the evaluation of patients at risk for COVID-19 are in a state of rapid change based on information released by regulatory bodies including the CDC and federal and state organizations. These policies and algorithms were followed during the patient's care in the ED.   Final Clinical Impression(s) / ED Diagnoses Final diagnoses:  Suspected COVID-19 virus infection    Rx / DC Orders ED Discharge Orders         Ordered    butalbital-acetaminophen-caffeine (FIORICET) 50-325-40 MG tablet  Every 8 hours PRN     Discontinue  Reprint     06/08/20 0253           Antony Madura, PA-C 06/08/20 4967    Marily Memos, MD 06/08/20 [phone]

## 2021-04-01 NOTE — Telephone Encounter (Signed)
Open in error

## 2021-04-14 ENCOUNTER — Other Ambulatory Visit: Payer: Self-pay

## 2021-04-14 ENCOUNTER — Ambulatory Visit (INDEPENDENT_AMBULATORY_CARE_PROVIDER_SITE_OTHER): Payer: Medicaid Other | Admitting: General Practice

## 2021-04-14 ENCOUNTER — Inpatient Hospital Stay (HOSPITAL_COMMUNITY)
Admission: AD | Admit: 2021-04-14 | Discharge: 2021-04-14 | Disposition: A | Discharge status: Home / Self Care | Payer: Medicaid Other | Attending: Obstetrics and Gynecology | Admitting: Obstetrics and Gynecology

## 2021-04-14 DIAGNOSIS — N926 Irregular menstruation, unspecified: Secondary | ICD-10-CM

## 2021-04-14 DIAGNOSIS — N912 Amenorrhea, unspecified: Secondary | ICD-10-CM | POA: Insufficient documentation

## 2021-04-14 DIAGNOSIS — Z3201 Encounter for pregnancy test, result positive: Secondary | ICD-10-CM

## 2021-04-14 LAB — POCT PREGNANCY, URINE: Preg Test, Ur: POSITIVE — AB

## 2021-04-14 NOTE — Discharge Instructions (Signed)

## 2021-04-14 NOTE — Progress Notes (Signed)
Patient came by office and dropped off urine sample for UPT. UPT +.  Called patient at home and she reports first positive home test on Monday. LMP 03/12/21 EDD 12/17/2021 [redacted]w[redacted]d. Patient denies taking any vitamins/medicines. Recommended she begin taking prenatal vitamins and start OB care. Patient verbalized understanding & desires to start care here. Told patient someone from our front office would reach out to her regarding initial OB appt.  Chase Caller RN BSN 04/14/21

## 2021-04-14 NOTE — MAU Note (Signed)
+  HPT yesterday and the day before.  Wants confirmation.  No problems. Denies pain or bleeding.

## 2021-04-14 NOTE — MAU Provider Note (Signed)
  S:   25 y.o. G0P0000 @Unknown  by LMP presents to MAU for pregnancy confirmation.  She denies abdominal pain or vaginal bleeding today.    O: BP 129/81 (BP Location: Right Arm)   Pulse 91   Temp 98.7 F (37.1 C) (Oral)   Resp 16   Ht 5\' 7"  (1.702 m)   Wt 50.5 kg   LMP 03/12/2021   SpO2 98%   BMI 17.45 kg/m  Physical Examination: General appearance - alert, well appearing, and in no distress, oriented to person, place, and time and acyanotic, in no respiratory distress  No results found for this or any previous visit (from the past 48 hour(s)).  A: Missed menses  P: D/C home Informed pt we do not do pregnancy verifications in MAU Referred to [facility] for pregnancy test & verification Return to MAU as needed for pregnancy related emergencies  , NP 12:56 PM

## 2021-04-15 NOTE — Progress Notes (Signed)
Patient was assessed and managed by nursing staff during this encounter. I have reviewed the chart and agree with the documentation and plan.   Edd Arbour, MSN, CNM, [facility] 04/15/21 10:02 AM

## 2021-06-09 ENCOUNTER — Encounter: Payer: Self-pay | Admitting: Family Medicine

## 2021-06-09 ENCOUNTER — Encounter: Payer: Medicaid Other | Admitting: Family Medicine

## 2021-06-09 NOTE — Progress Notes (Signed)
Patient did not keep appointment today. She will be called to reschedule.  

## 2021-12-14 ENCOUNTER — Encounter: Payer: Self-pay | Admitting: Emergency Medicine

## 2021-12-14 ENCOUNTER — Other Ambulatory Visit: Payer: Self-pay

## 2021-12-14 ENCOUNTER — Ambulatory Visit
Admission: EM | Admit: 2021-12-14 | Discharge: 2021-12-14 | Disposition: A | Discharge status: Home / Self Care | Payer: Medicaid Other | Attending: Physician Assistant | Admitting: Physician Assistant

## 2021-12-14 DIAGNOSIS — L309 Dermatitis, unspecified: Secondary | ICD-10-CM

## 2021-12-14 MED ORDER — KETOCONAZOLE 2 % EX CREA: 1.0000 "application " | TOPICAL_CREAM | Freq: Every day | CUTANEOUS | 0 refills | Status: DC

## 2021-12-14 MED ORDER — PREDNISONE 20 MG PO TABS: 40.0000 mg | ORAL_TABLET | Freq: Every day | ORAL | 0 refills | Status: AC

## 2021-12-14 NOTE — ED Provider Notes (Signed)
[facility]    CSN: [id number] Arrival date & time: 12/14/21  1431      History   Chief Complaint Chief Complaint  Patient presents with   Rash    HPI Rebekah Rhodes is a 26 y.o. female.   Patient here today for evaluation of widespread rash that she has noticed over the last week.  She states that rash is very itchy.  She has been using over-the-counter medication without significant relief.  She does have a dog at home but has never had this rash before.  She denies any fever.  She has not had any trouble breathing or shortness of breath.  She denies any trouble swallowing.  The history is provided by the patient.   Past Medical History:  Diagnosis Date   Anxiety    Asthma    Chlamydia    Depression    was rx meds, has not picked them up   Eczema    Genital herpes    HA (headache)    Infection    UTI    Patient Active Problem List   Diagnosis Date Noted   MDD (major depressive disorder), recurrent episode, severe (HCC) 08/22/2018   Bacterial vaginitis 05/16/2018   Vaginal irritation 05/16/2018   Cutaneous candidiasis 05/16/2018   HSV-1 (herpes simplex virus 1) infection 11/13/2017   Migraine 03/10/2014   HA (headache)     Past Surgical History:  Procedure Laterality Date   NO PAST SURGERIES     None     WISDOM TOOTH EXTRACTION      OB History     Gravida  1   Para  0   Term  0   Preterm  0   AB  0   Living  0      SAB  0   IAB  0   Ectopic  0   Multiple  0   Live Births               Home Medications    Prior to Admission medications   Medication Sig Start Date End Date Taking? Authorizing Provider  ketoconazole (NIZORAL) 2 % cream Apply 1 application topically daily. 12/14/21  Yes Tomi Bamberger, PA-C  predniSONE (DELTASONE) 20 MG tablet Take 2 tablets (40 mg total) by mouth daily with breakfast for 5 days. 12/14/21 12/19/21 Yes Tomi Bamberger, PA-C  valACYclovir (VALTREX) 1000 MG tablet Take 1 tablet (1,000  mg total) by mouth 2 (two) times daily. 08/14/19   Marny Lowenstein, PA-C    Family History Family History  Problem Relation Age of Onset   Breast cancer Mother    Cancer Maternal Grandmother        breast; also great grandma    Social History Social History   Tobacco Use   Smoking status: Never   Smokeless tobacco: Never  Vaping Use   Vaping Use: Never used  Substance Use Topics   Alcohol use: No   Drug use: Yes    Frequency: 7.0 times per week    Types: Marijuana     Allergies   Amoxicillin and Penicillins   Review of Systems Review of Systems  Constitutional:  Negative for chills and fever.  HENT:  Negative for drooling and trouble swallowing.   Eyes:  Negative for discharge and redness.  Respiratory:  Negative for shortness of breath and wheezing.   Gastrointestinal:  Negative for nausea and vomiting.  Skin:  Positive for rash.  Physical Exam Triage Vital Signs ED Triage Vitals  Enc Vitals Group     BP      Pulse      Resp      Temp      Temp src      SpO2      Weight      Height      Head Circumference      Peak Flow      Pain Score      Pain Loc      Pain Edu?      Excl. in GC?    No data found.  Updated Vital Signs BP 131/80 (BP Location: Left Arm)    Pulse 95    Temp 98.1 F (36.7 C) (Oral)    Resp 18    LMP 03/12/2021    SpO2 99%    Breastfeeding Unknown      Physical Exam Vitals and nursing note reviewed.  Constitutional:      General: She is not in acute distress.    Appearance: Normal appearance. She is not ill-appearing.  HENT:     Head: Normocephalic and atraumatic.     Nose: Nose normal.  Cardiovascular:     Rate and Rhythm: Normal rate and regular rhythm.     Heart sounds: Normal heart sounds. No murmur heard. Pulmonary:     Effort: Pulmonary effort is normal. No respiratory distress.     Breath sounds: Normal breath sounds. No wheezing, rhonchi or rales.  Skin:    General: Skin is warm and dry.     Findings: Rash  (annular macular rash spread diffusely to arms and trunk with mild erythema, dry appearing, various sizes) present.  Neurological:     Mental Status: She is alert.  Psychiatric:        Mood and Affect: Mood normal.        Thought Content: Thought content normal.     UC Treatments / Results  Labs (all labs ordered are listed, but only abnormal results are displayed) Labs Reviewed - No data to display  EKG   Radiology No results found.  Procedures Procedures (including critical care time)  Medications Ordered in UC Medications - No data to display  Initial Impression / Assessment and Plan / UC Course  I have reviewed the triage vital signs and the nursing notes.  Pertinent labs & imaging results that were available during my care of the patient were reviewed by me and considered in my medical decision making (see chart for details).    Rash consistent with pityriasis rosea vs tinea- will treat with steroid burst and antifungal treat topically. Encouraged follow up with any further concerns or if symptoms fail to improve.   Final Clinical Impressions(s) / UC Diagnoses   Final diagnoses:  Dermatitis   Discharge Instructions   None    ED Prescriptions     Medication Sig Dispense Auth. Provider   ketoconazole (NIZORAL) 2 % cream Apply 1 application topically daily. 30 g Erma Pinto F, PA-C   predniSONE (DELTASONE) 20 MG tablet Take 2 tablets (40 mg total) by mouth daily with breakfast for 5 days. 10 tablet Tomi Bamberger, PA-C      PDMP not reviewed this encounter.   Tomi Bamberger, PA-C 12/14/21 1651

## 2021-12-14 NOTE — ED Triage Notes (Signed)
Pt here for generalized itchy rash x 1 week; pt sts using OTC creams without relief

## 2021-12-23 ENCOUNTER — Other Ambulatory Visit: Payer: Self-pay

## 2021-12-23 ENCOUNTER — Encounter (HOSPITAL_COMMUNITY): Payer: Self-pay | Admitting: Emergency Medicine

## 2021-12-23 ENCOUNTER — Emergency Department (HOSPITAL_COMMUNITY)
Admission: EM | Admit: 2021-12-23 | Discharge: 2021-12-23 | Disposition: A | Discharge status: Home / Self Care | Payer: Medicaid Other | Attending: Emergency Medicine | Admitting: Emergency Medicine

## 2021-12-23 DIAGNOSIS — R21 Rash and other nonspecific skin eruption: Secondary | ICD-10-CM

## 2021-12-23 DIAGNOSIS — L42 Pityriasis rosea: Secondary | ICD-10-CM | POA: Insufficient documentation

## 2021-12-23 NOTE — ED Provider Notes (Signed)
[facility] DEPT Provider Note   CSN: [id number] Arrival date & time: 12/23/21  1323     History  Chief Complaint  Patient presents with   Allergic Reaction    Rebekah Rhodes is a 26 y.o. female who presents the emergency department complaining of a rash for 3 weeks.  Patient was initially seen at urgent care on 2/14 for the same, she was given steroid pills as well as an antifungal cream with no relief.  Patient initially thought that the rash was related to an allergic reaction, so she changed out her detergent, and had no improvement in her symptoms.  She has been using hydrocortisone cream, Benadryl cream, taking some oral Benadryl.  She states that the rash is not worsening, but is not getting any better.  She also reports a prior history of oral herpes.  No fevers, chills, difficulty breathing or difficulty swallowing.   Allergic Reaction Presenting symptoms: rash   Presenting symptoms: no difficulty swallowing       Home Medications Prior to Admission medications   Medication Sig Start Date End Date Taking? Authorizing Provider  ketoconazole (NIZORAL) 2 % cream Apply 1 application topically daily. 12/14/21   Francene Finders, PA-C  valACYclovir (VALTREX) 1000 MG tablet Take 1 tablet (1,000 mg total) by mouth 2 (two) times daily. 08/14/19   Luvenia Redden, PA-C      Allergies    Amoxicillin and Penicillins    Review of Systems   Review of Systems  Constitutional:  Negative for chills and fever.  HENT:  Negative for sore throat and trouble swallowing.   Respiratory:  Negative for shortness of breath.   Skin:  Positive for rash.  All other systems reviewed and are negative.  Physical Exam Updated Vital Signs BP 128/81 (BP Location: Right Arm)    Pulse 77    Temp 98.5 F (36.9 C) (Oral)    Resp 18    LMP 03/12/2021    SpO2 99%  Physical Exam Vitals and nursing note reviewed.  Constitutional:      Appearance: Normal appearance.  HENT:      Head: Normocephalic and atraumatic.  Eyes:     Conjunctiva/sclera: Conjunctivae normal.  Pulmonary:     Effort: Pulmonary effort is normal. No respiratory distress.  Skin:    General: Skin is warm and dry.     Comments: Diffuse maculopapular rash over the bilateral arms and trunk.  1 darker appearing scaling lesion on the anterior left elbow.  Some evidence of excoriation, no overlying erythema or evidence of cellulitis.  Neurological:     Mental Status: She is alert.  Psychiatric:        Mood and Affect: Mood normal.        Behavior: Behavior normal.    ED Results / Procedures / Treatments   Labs (all labs ordered are listed, but only abnormal results are displayed) Labs Reviewed - No data to display  EKG None  Radiology No results found.  Procedures Procedures    Medications Ordered in ED Medications - No data to display  ED Course/ Medical Decision Making/ A&P                           Medical Decision Making  Patient is an otherwise healthy 26 year old female presents to the emergency department complaining of a rash for 3 weeks.    Additional history: She was seen at urgent care and  was diagnosed with tinea infection, given steroid burst and antifungal cream.  I reviewed the note from this visit on 2/14 as well as the discharge summary.  Physical exam: On my exam patient has a diffuse maculopapular rash over her bilateral arms and trunk.  She does have 1 darker appearing lesion that looks scaly on her anterior left elbow.  There is no evidence of cellulitis.  Patient is afebrile, is clinically well-appearing, and is in no acute distress.  Disposition: Discussed with the patient that I think her symptoms are most likely related to pityriasis rosea.  She previously received treatment for a fungal infection, and I think that this would have been a likely diagnosis, but the fact that her rash did not improve with this only points to a different diagnosis.  Also  due to the timeline of the patient's symptoms, I think this aligns with the most likely diagnosis.  Discussed with the patient that this is most commonly related to a herpes infection, patient does report a prior history of oral herpes.  She is not requiring admission or inpatient treatment for her symptoms.  We discussed symptomatic management of the rash including antihistamines, topical steroids and topical antihistamines.  Advised her that it could take up to 12 weeks for her symptoms to resolve.  If her symptoms do not resolve by then, I advised her to follow-up with a dermatologist.  Discussed reasons to return to the emergency department, patient agreeable to the plan.   Final Clinical Impression(s) / ED Diagnoses Final diagnoses:  Pityriasis rosea  Rash    Rx / DC Orders ED Discharge Orders     None      Portions of this report may have been transcribed using voice recognition software. Every effort was made to ensure accuracy; however, inadvertent computerized transcription errors may be present.    Kateri Plummer, PA-C 12/23/21 1531    Lorelle Gibbs, DO 12/24/21 2359

## 2021-12-23 NOTE — ED Triage Notes (Signed)
Pt reports breaking out in hives and a rash x 3 weeks now. Pt was seen at [facility] for same. Pt was given steroid creams with no relief.

## 2021-12-23 NOTE — Discharge Instructions (Addendum)
You were seen in the emergency department for a rash.   As we discussed I think your rash is called Pityriasis rosea. This is often a viral rash related to the herpes virus. It is not contagious, and often resolves in 4-12 weeks. It usually begins with a single "herald" patch and then the rash distributes from there. Your rash does not look infected.  Some studies show that natural sunlight can help speed up the resolution, as well as topical antihistamine creams (benadryl) or barrier creams (zinc oxide, calamine lotion). I'd like you to start taking an allergy medicine daily (Claritin, Zyrtec, Allegra) and then additional bendaryl on top of that if needed.   I'd recommend that if your rash does not resolve in 12 weeks after the time it started, to follow up with a dermatologist.

## 2023-03-17 ENCOUNTER — Encounter: Payer: Self-pay | Admitting: Student

## 2023-03-17 ENCOUNTER — Inpatient Hospital Stay (HOSPITAL_COMMUNITY)
Admission: AD | Admit: 2023-03-17 | Discharge: 2023-03-17 | Disposition: A | Discharge status: Home / Self Care | Payer: Managed Care, Other (non HMO) | Attending: Obstetrics and Gynecology | Admitting: Obstetrics and Gynecology

## 2023-03-17 DIAGNOSIS — Z3A01 Less than 8 weeks gestation of pregnancy: Secondary | ICD-10-CM | POA: Insufficient documentation

## 2023-03-17 DIAGNOSIS — Z3201 Encounter for pregnancy test, result positive: Secondary | ICD-10-CM | POA: Diagnosis not present

## 2023-03-17 LAB — POCT PREGNANCY, URINE: Preg Test, Ur: POSITIVE — AB

## 2023-03-17 NOTE — Discharge Instructions (Signed)
[facility] Ob/Gyn Providers          [facility]  [address], [place], [place] [address]  [phone]  [facility]  [address], [place], [place] [address]  [phone]  [facility]  [address], [place], [place] [address]  [phone]  [facility] [address], [place], [place] [address] [phone]  [facility] [place] [facility]  [address], [place], [place] [address]  [phone]  [facility]  [address] floor), [place], [place] [address]  [phone]  [facility]  [address], [place], [place] [address]  [phone]  [facility]  [address], [place], [place] [address]  [phone]  [facility]  [address], [place], [place] [address]  [phone]  [facility]  [address], [place], [place] [address]  [phone]  [facility]  [address], [place], [place] [address]  [phone]  [facility]  [address], [place], [place] [address]  [phone]  [facility]   [address], [place], [place] [address]  [phone]  [facility]  [address], [place], [place] [address]   [phone]  [facility] [address], [place],  [address] [phone]  [facility]  [address], [place],  [address]  [phone]      Safe Medications in Pregnancy   Acne: Benzoyl Peroxide Salicylic Acid  Backache/Headache: Tylenol: 2 regular strength every 4 hours OR              2 Extra strength every 6 hours  Colds/Coughs/Allergies: Benadryl (alcohol free) 25 mg every 6 hours as needed Breath right strips Claritin Cepacol throat  lozenges Chloraseptic throat spray Cold-Eeze- up to three times per day Cough drops, alcohol free Flonase (by prescription only) Guaifenesin Mucinex Robitussin DM (plain only, alcohol free) Saline nasal spray/drops Sudafed (pseudoephedrine) & Actifed ** use only after [redacted] weeks gestation and if you do not have high blood pressure Tylenol Vicks Vaporub Zinc lozenges Zyrtec   Constipation: Colace Ducolax suppositories Fleet enema Glycerin suppositories Metamucil Milk of magnesia Miralax Senokot Smooth move tea  Diarrhea: Kaopectate Imodium A-D  *NO pepto Bismol  Hemorrhoids: Anusol Anusol HC Preparation H Tucks  Indigestion: Tums Maalox Mylanta Zantac  Pepcid  Insomnia: Benadryl (alcohol free) 25mg every 6 hours as needed Tylenol PM Unisom, no Gelcaps  Leg Cramps: Tums MagGel  Nausea/Vomiting:  Bonine Dramamine Emetrol Ginger extract Sea bands Meclizine  Nausea medication to take during pregnancy:  Unisom (doxylamine succinate 25 mg tablets) Take one tablet daily at bedtime. If symptoms are not adequately controlled, the dose can be increased to a maximum recommended dose of two tablets daily (1/2 tablet in the morning, 1/2 tablet mid-afternoon and one at bedtime). Vitamin B6 100mg tablets. Take one tablet twice a day (up to 200 mg per day).  Skin Rashes: Aveeno products Benadryl cream or 25mg every 6 hours as needed Calamine Lotion 1% cortisone cream  Yeast infection: Gyne-lotrimin 7 Monistat 7  Gum/tooth pain: Anbesol  **If taking multiple medications, please check labels to avoid duplicating the same active ingredients **take medication as directed on the label ** Do not exceed 4000 mg of tylenol   in 24 hours **Do not take medications that contain aspirin or ibuprofen    

## 2023-03-17 NOTE — MAU Note (Signed)
Pt says she took 2 UPT at home and both were positive . LMP-02-16-2023 No BC Came to see how far preg she is.

## 2023-03-17 NOTE — MAU Provider Note (Signed)
  S:   27 y.o. G2P0000 @[redacted]w[redacted]d  by LMP presents to MAU for pregnancy confirmation.  She denies abdominal pain or vaginal bleeding today.    O: BP 129/74 (BP Location: Right Arm)   Pulse (!) 103   Temp 98.1 F (36.7 C) (Oral)   Resp 16   Ht 5\' 7"  (1.702 m)   Wt 55.4 kg   LMP 02/16/2023   BMI 19.14 kg/m  Physical Examination: General appearance - alert, well appearing, and in no distress, oriented to person, place, and time and acyanotic, in no respiratory distress  Results for orders placed or performed during the hospital encounter of 03/17/23 (from the past 48 hour(s))  Pregnancy, urine POC     Status: Abnormal   Collection Time: 03/17/23  7:36 PM  Result Value Ref Range   Preg Test, Ur POSITIVE (A) NEGATIVE    Comment:        THE SENSITIVITY OF THIS METHODOLOGY IS >24 mIU/mL     A: 1. Positive pregnancy test   2. [redacted] weeks gestation of pregnancy      P: D/C home No complaints today. Given list of ob providers & scheduled for viability scan at [facility] in 2 weeks. Reviewed reasons to return to MAU  Judeth Horn, NP 7:49 PM

## 2023-03-20 ENCOUNTER — Other Ambulatory Visit: Payer: Self-pay

## 2023-03-20 ENCOUNTER — Other Ambulatory Visit (INDEPENDENT_AMBULATORY_CARE_PROVIDER_SITE_OTHER): Payer: Managed Care, Other (non HMO)

## 2023-03-20 DIAGNOSIS — E0590 Thyrotoxicosis, unspecified without thyrotoxic crisis or storm: Secondary | ICD-10-CM

## 2023-03-21 LAB — T4, FREE: Free T4: 3.16 ng/dL — ABNORMAL HIGH (ref 0.60–1.60)

## 2023-03-21 LAB — TSH: TSH: <0.01 u[IU]/mL — ABNORMAL LOW (ref 0.35–5.50)

## 2023-03-21 LAB — T3, FREE: T3, Free: 10.8 pg/mL — ABNORMAL HIGH (ref 2.3–4.2)

## 2023-03-22 ENCOUNTER — Encounter: Payer: Self-pay | Admitting: "Endocrinology

## 2023-03-22 ENCOUNTER — Ambulatory Visit (INDEPENDENT_AMBULATORY_CARE_PROVIDER_SITE_OTHER): Payer: Managed Care, Other (non HMO) | Admitting: "Endocrinology

## 2023-03-22 VITALS — BP 128/80 | HR 105 | Ht 67.0 in | Wt 117.2 lb

## 2023-03-22 DIAGNOSIS — Z87891 Personal history of nicotine dependence: Secondary | ICD-10-CM

## 2023-03-22 DIAGNOSIS — E0590 Thyrotoxicosis, unspecified without thyrotoxic crisis or storm: Secondary | ICD-10-CM | POA: Diagnosis not present

## 2023-03-22 DIAGNOSIS — Z3A01 Less than 8 weeks gestation of pregnancy: Secondary | ICD-10-CM

## 2023-03-22 MED ORDER — PROPYLTHIOURACIL 50 MG PO TABS: 50.0000 mg | ORAL_TABLET | Freq: Two times a day (BID) | ORAL | 0 refills | Status: DC

## 2023-03-22 NOTE — Progress Notes (Signed)
Outpatient Endocrinology Note Altamese Layhill, MD  03/22/23   Rebekah Rhodes 1996/09/18 [id number]  Referring Provider: No ref. provider found Primary Care Provider: Patient, No Pcp Per Subjective  No chief complaint on file.   Assessment & Plan  Diagnoses and all orders for this visit:  Hyperthyroidism -     TSH; Future -     T4, free; Future -     T3, free; Future -     US THYROID; Future  Less than [redacted] weeks gestation of pregnancy -     US THYROID; Future  Smoker within last 12 months  Other orders -     propylthiouracil (PTU) 50 MG tablet; Take 1 tablet (50 mg total) by mouth 2 (two) times daily.    Rebekah Rhodes is currently not taking any thyroid medication. Pt was diagnosed frank hyperthyroid at 0-1 week pregnancy. Patient currently clinically and biochemically hyperthyroid.  Educated on thyroid axis.  Discussed the etiology for hyperthyroidism. Educated on thyroid axis.  Recommend the following: Take PTU 50 mg twice a day. Repeat labs sooner if symptoms of hyper or hypothyroidism develop.  Counseled on: -complications of untreated hyperthyroidism including atrial fibrillation, heart failure and osteoporosis -side effects of Methimazole including but not limited to allergic reaction, rash, bone marrow suppression, liver dysfunction and teratogenic potential -implications in pregnancy and breastfeeding -compliance and follow up needs    Thyromegaly noted without obstructive symptoms Ordered thyroid ultrasound per patient agreement  Recently quit smoking after she found out pregnancy Encouraged and motivated   If you notice any symptoms of worsening fatigue, fever with sore throat, loss of appetite, yellowing of eyes, dark urine, joint pains, sores in the mouth, itchy rash, light colored stools or abdominal pain, please stop the medication and call us immediately as this can be a serious side effect of the medication.   I have reviewed current  medications, nurse's notes, allergies, vital signs, past medical and surgical history, family medical history, and social history for this encounter. Counseled patient on symptoms, examination findings, lab findings, imaging results, treatment decisions and monitoring and prognosis. The patient understood the recommendations and agrees with the treatment plan. All questions regarding treatment plan were fully answered.   Return in about 19 days (around 04/10/2023) for visit + labs before next visit-double book at 9:40 am.   Altamese Greenfield, MD  03/22/23   I have reviewed current medications, nurse's notes, allergies, vital signs, past medical and surgical history, family medical history, and social history for this encounter. Counseled patient on symptoms, examination findings, lab findings, imaging results, treatment decisions and monitoring and prognosis. The patient understood the recommendations and agrees with the treatment plan. All questions regarding treatment plan were fully answered.   History of Present Illness Rebekah Rhodes is a 27 y.o. year old female who presents to our clinic with hyperthyroidism diagnosed in 2024 found on regular check up.    Pt is 4-[redacted] week pregnant with her first child.  Symptoms suggestive of HYPERTHYROIDISM:  weight loss  Yes, lost appetite heat intolerance Yes hyperdefecation  No palpitations  Yes  Compressive symptoms:  dysphagia  No dysphonia  No positional dyspnea (especially with simultaneous arms elevation)  No  Smokes No On biotin  No Personal history of head/neck surgery/irradiation  No  Grave's Ophthalmopathy Clinical Activity Score: 0/9  Apr 30 FT4 4.12 TSH <0.005  Physical Exam  BP 128/80   Pulse (!) 105   Ht 5\' 7"  (1.702 m)  Wt 117 lb 3.2 oz (53.2 kg)   LMP 02/16/2023   SpO2 99%   BMI 18.36 kg/m  Constitutional: well developed, well nourished Head: normocephalic, atraumatic, no exophthalmos Eyes: sclera anicteric, no  redness Neck: + thyromegaly, no thyroid tenderness; no nodules palpated Lungs: normal respiratory effort Neurology: alert and oriented, no fine hand tremor Skin: dry, no appreciable rashes Musculoskeletal: no appreciable defects Psychiatric: normal mood and affect  Allergies Allergies  Allergen Reactions   Amoxicillin Hives, Diarrhea and Nausea And Vomiting    Childhood allergy Has patient had a PCN reaction causing immediate rash, facial/tongue/throat swelling, SOB or lightheadedness with hypotension: Yes Has patient had a PCN reaction causing severe rash involving mucus membranes or skin necrosis: No Has patient had a PCN reaction that required hospitalization: No Has patient had a PCN reaction occurring within the last 10 years: No If all of the above answers are "NO", then may proceed with Cephalosporin use.   Penicillins Hives, Diarrhea and Nausea And Vomiting    Has patient had a PCN reaction causing immediate rash, facial/tongue/throat swelling, SOB or lightheadedness with hypotension: Yes Has patient had a PCN reaction causing severe rash involving mucus membranes or skin necrosis: No Has patient had a PCN reaction that required hospitalization No Has patient had a PCN reaction occurring within the last 10 years: No If all of the above answers are "NO", then may proceed with Cephalosporin    Current Medications Patient's Medications  New Prescriptions   PROPYLTHIOURACIL (PTU) 50 MG TABLET    Take 1 tablet (50 mg total) by mouth 2 (two) times daily.  Previous Medications   No medications on file  Modified Medications   No medications on file  Discontinued Medications   No medications on file    Past Medical History Past Medical History:  Diagnosis Date   Anxiety    Asthma    Chlamydia    Depression    was rx meds, has not picked them up   Eczema    Genital herpes    HA (headache)    Infection    UTI    Past Surgical History Past Surgical History:   Procedure Laterality Date   NO PAST SURGERIES     None     WISDOM TOOTH EXTRACTION      Family History family history includes Breast cancer in her mother; Cancer in her maternal grandmother.  Social History Social History   Socioeconomic History   Marital status: Single    Spouse name: Not on file   Number of children: 0   Years of education: 12   Highest education level: Not on file  Occupational History    Comment: High school student  Tobacco Use   Smoking status: Never   Smokeless tobacco: Never  Vaping Use   Vaping Use: Never used  Substance and Sexual Activity   Alcohol use: No   Drug use: Yes    Frequency: 7.0 times per week    Types: Marijuana   Sexual activity: Yes    Birth control/protection: Implant  Other Topics Concern   Not on file  Social History Narrative   Patient lives at home with her father Rebekah Rhodes).   Patient is a high Ecologist.   Right handed.   Caffeine soda one daily.   Social Determinants of Health   Financial Resource Strain: Not on file  Food Insecurity: Not on file  Transportation Needs: Not on file  Physical Activity: Not on file  Stress: Not on file  Social Connections: Not on file  Intimate Partner Violence: Not on file    Laboratory Investigations Lab Results  Component Value Date   TSH <0.01 Repeated and verified X2. (L) 03/20/2023   FREET4 3.16 (H) 03/20/2023     No results found for: "TSI"   No components found for: "TRAB"   No results found for: "CHOL" No results found for: "HDL" No results found for: "LDLCALC" No results found for: "TRIG" No results found for: "CHOLHDL" Lab Results  Component Value Date   CREATININE 0.58 06/22/2019   No results found for: "GFR"    Component Value Date/Time   NA 139 06/22/2019 1405   K 3.6 06/22/2019 1405   CL 106 06/22/2019 1405   CO2 25 06/22/2019 1405   GLUCOSE 81 06/22/2019 1405   BUN 13 06/22/2019 1405   CREATININE 0.58 06/22/2019 1405   CALCIUM 9.4  06/22/2019 1405   PROT 7.8 06/22/2019 1405   ALBUMIN 4.7 06/22/2019 1405   AST 18 06/22/2019 1405   ALT 13 06/22/2019 1405   ALKPHOS 45 06/22/2019 1405   BILITOT 1.2 06/22/2019 1405   GFRNONAA >60 06/22/2019 1405   GFRAA >60 06/22/2019 1405      Latest Ref Rng & Units 06/22/2019    2:05 PM 08/22/2018    1:03 AM 01/02/2018   12:35 AM  BMP  Glucose 70 - 99 mg/dL 81  88  93   BUN 6 - 20 mg/dL 13  14  14    Creatinine 0.44 - 1.00 mg/dL 1.61  0.96  0.45   Sodium 135 - 145 mmol/L 139  139  139   Potassium 3.5 - 5.1 mmol/L 3.6  3.5  4.1   Chloride 98 - 111 mmol/L 106  108  107   CO2 22 - 32 mmol/L 25  24  24    Calcium 8.9 - 10.3 mg/dL 9.4  9.1  9.4        Component Value Date/Time   WBC 3.4 (L) 06/22/2019 1405   RBC 4.43 06/22/2019 1405   HGB 12.1 06/22/2019 1405   HCT 38.0 06/22/2019 1405   PLT 200 06/22/2019 1405   MCV 85.8 06/22/2019 1405   MCH 27.3 06/22/2019 1405   MCHC 31.8 06/22/2019 1405   RDW 12.6 06/22/2019 1405   LYMPHSABS 2.0 06/22/2019 1405   MONOABS 0.4 06/22/2019 1405   EOSABS 0.0 06/22/2019 1405   BASOSABS 0.1 06/22/2019 1405      Parts of this note may have been dictated using voice recognition software. There may be variances in spelling and vocabulary which are unintentional. Not all errors are proofread. Please notify the Thereasa Parkin if any discrepancies are noted or if the meaning of any statement is not clear.

## 2023-03-23 LAB — THYROID STIMULATING IMMUNOGLOBULIN: TSI: 306 % baseline — ABNORMAL HIGH (ref <140)

## 2023-03-23 LAB — TRAB (TSH RECEPTOR BINDING ANTIBODY): TRAB: 8.41 IU/L — ABNORMAL HIGH (ref <=2.00)

## 2023-03-28 DIAGNOSIS — E0590 Thyrotoxicosis, unspecified without thyrotoxic crisis or storm: Secondary | ICD-10-CM | POA: Insufficient documentation

## 2023-04-03 ENCOUNTER — Other Ambulatory Visit: Payer: Self-pay | Admitting: General Practice

## 2023-04-03 DIAGNOSIS — O3680X1 Pregnancy with inconclusive fetal viability, fetus 1: Secondary | ICD-10-CM

## 2023-04-04 ENCOUNTER — Ambulatory Visit: Payer: Managed Care, Other (non HMO)

## 2023-04-04 ENCOUNTER — Other Ambulatory Visit: Payer: Managed Care, Other (non HMO)

## 2023-04-04 DIAGNOSIS — Z3A01 Less than 8 weeks gestation of pregnancy: Secondary | ICD-10-CM | POA: Diagnosis not present

## 2023-04-04 DIAGNOSIS — O3680X1 Pregnancy with inconclusive fetal viability, fetus 1: Secondary | ICD-10-CM | POA: Diagnosis not present

## 2023-04-06 ENCOUNTER — Other Ambulatory Visit (INDEPENDENT_AMBULATORY_CARE_PROVIDER_SITE_OTHER): Payer: Managed Care, Other (non HMO)

## 2023-04-06 DIAGNOSIS — E0590 Thyrotoxicosis, unspecified without thyrotoxic crisis or storm: Secondary | ICD-10-CM

## 2023-04-06 LAB — T4, FREE: Free T4: 2.05 ng/dL — ABNORMAL HIGH (ref 0.60–1.60)

## 2023-04-06 LAB — TSH: TSH: <0.01 u[IU]/mL — ABNORMAL LOW (ref 0.35–5.50)

## 2023-04-06 LAB — T3, FREE: T3, Free: 7.5 pg/mL — ABNORMAL HIGH (ref 2.3–4.2)

## 2023-04-10 ENCOUNTER — Encounter: Payer: Self-pay | Admitting: "Endocrinology

## 2023-04-10 ENCOUNTER — Ambulatory Visit (INDEPENDENT_AMBULATORY_CARE_PROVIDER_SITE_OTHER): Payer: Managed Care, Other (non HMO) | Admitting: "Endocrinology

## 2023-04-10 VITALS — BP 120/68 | HR 103 | Ht 67.0 in | Wt 118.4 lb

## 2023-04-10 DIAGNOSIS — E0500 Thyrotoxicosis with diffuse goiter without thyrotoxic crisis or storm: Secondary | ICD-10-CM

## 2023-04-10 DIAGNOSIS — Z3A01 Less than 8 weeks gestation of pregnancy: Secondary | ICD-10-CM | POA: Diagnosis not present

## 2023-04-10 LAB — T3, FREE: T3, Free: 7.1 pg/mL — ABNORMAL HIGH (ref 2.3–4.2)

## 2023-04-10 LAB — T4, FREE: Free T4: 1.92 ng/dL — ABNORMAL HIGH (ref 0.60–1.60)

## 2023-04-10 LAB — TSH: TSH: <0.01 u[IU]/mL — ABNORMAL LOW (ref 0.35–5.50)

## 2023-04-10 MED ORDER — PROPYLTHIOURACIL 50 MG PO TABS: 100.0000 mg | ORAL_TABLET | Freq: Two times a day (BID) | ORAL | 2 refills | Status: DC

## 2023-04-10 NOTE — Progress Notes (Signed)
Outpatient Endocrinology Note Rebekah Rhodes [place], MD  04/10/23   ALAUNA Rhodes [birth year] [id number]  Referring Provider: No ref. provider found Primary Care Provider: Patient, No Pcp Per Subjective  No chief complaint on file.   Assessment & Plan  Diagnoses and all orders for this visit:  Graves disease -     T4, free; Future -     T3, free; Future -     TSH; Future  Less than [redacted] weeks gestation of pregnancy  Other orders -     propylthiouracil (PTU) 50 MG tablet; Take 2 tablets (100 mg total) by mouth 2 (two) times daily.   Rebekah Rhodes is currently taking propylthiouracil 50 mg twice daily.  Patient is not able to tolerate 3 times daily dosing due to pregnancy related nausea vomiting. Patient explained the diagnosis of Graves' disease at length and what it means for her and her baby.  Patient understands that it can cause tachycardia and to inform her OB/GYN, the neonatologist and pediatrician timely.  Explained how autoimmune conditions can run together and to let her PCP know if she experiences any new symptoms. Pt was diagnosed frank hyperthyroid at 0-1 week pregnancy. Patient currently clinically and biochemically hyperthyroid.  Educated on thyroid axis.  Recommend the following: Take PTU 100 mg twice a day. Repeat labs sooner if symptoms of hyper or hypothyroidism develop.  Counseled on: -complications of untreated hyperthyroidism including atrial fibrillation, heart failure and osteoporosis -side effects of Methimazole including but not limited to allergic reaction, rash, bone marrow suppression, liver dysfunction and teratogenic potential -implications in pregnancy and breastfeeding -compliance and follow up needs    Thyromegaly noted without obstructive symptoms Ordered thyroid ultrasound per patient agreement, pending ultrasound at this point  Recently quit smoking after she found out pregnancy Encouraged and motivated   If you notice any symptoms of  worsening fatigue, fever with sore throat, loss of appetite, yellowing of eyes, dark urine, joint pains, sores in the mouth, itchy rash, light colored stools or abdominal pain, please stop the medication and call us immediately as this can be a serious side effect of the medication.   I have reviewed current medications, nurse's notes, allergies, vital signs, past medical and surgical history, family medical history, and social history for this encounter. Counseled patient on symptoms, examination findings, lab findings, imaging results, treatment decisions and monitoring and prognosis. The patient understood the recommendations and agrees with the treatment plan. All questions regarding treatment plan were fully answered.   Return in about 4 weeks (around 05/08/2023).   Rebekah Rhodes , MD  04/10/23   I have reviewed current medications, nurse's notes, allergies, vital signs, past medical and surgical history, family medical history, and social history for this encounter. Counseled patient on symptoms, examination findings, lab findings, imaging results, treatment decisions and monitoring and prognosis. The patient understood the recommendations and agrees with the treatment plan. All questions regarding treatment plan were fully answered.   History of Present Illness Rebekah Rhodes Rebekah Rhodes is a 27 y.o. year old female who presents to our clinic with hyperthyroidism diagnosed in 2024 found on regular check up.    Pt is 7 weeks + 4 days pregnant with her first child.  Symptoms suggestive of HYPERTHYROIDISM:  weight loss  No, has nausea/vomiting heat intolerance Yes hyperdefecation  No palpitations  Yes on exertion  Compressive symptoms:  dysphagia  No dysphonia  No positional dyspnea (especially with simultaneous arms elevation)  No  Smokes No On biotin  No Personal history of head/neck surgery/irradiation  No  Grave's Ophthalmopathy Clinical Activity Score: 0/9  Apr 30 FT4 4.12 TSH  <0.005  Physical Exam  BP 120/68   Pulse (!) 103   Ht 5\' 7"  (1.702 m)   Wt 118 lb 6.4 oz (53.7 kg)   LMP 02/16/2023   SpO2 97%   BMI 18.54 kg/m  Constitutional: well developed, well nourished Head: normocephalic, atraumatic, no exophthalmos Eyes: sclera anicteric, no redness Neck: + thyromegaly, no thyroid tenderness; no nodules palpated Lungs: normal respiratory effort Neurology: alert and oriented, no fine hand tremor Skin: dry, no appreciable rashes Musculoskeletal: no appreciable defects Psychiatric: normal mood and affect  Allergies Allergies  Allergen Reactions   Amoxicillin Hives, Diarrhea and Nausea And Vomiting    Childhood allergy Has patient had a PCN reaction causing immediate rash, facial/tongue/throat swelling, SOB or lightheadedness with hypotension: Yes Has patient had a PCN reaction causing severe rash involving mucus membranes or skin necrosis: No Has patient had a PCN reaction that required hospitalization: No Has patient had a PCN reaction occurring within the last 10 years: No If all of the above answers are "NO", then may proceed with Cephalosporin use.   Penicillins Hives, Diarrhea and Nausea And Vomiting    Has patient had a PCN reaction causing immediate rash, facial/tongue/throat swelling, SOB or lightheadedness with hypotension: Yes Has patient had a PCN reaction causing severe rash involving mucus membranes or skin necrosis: No Has patient had a PCN reaction that required hospitalization No Has patient had a PCN reaction occurring within the last 10 years: No If all of the above answers are "NO", then may proceed with Cephalosporin    Current Medications Patient's Medications  New Prescriptions   No medications on file  Previous Medications   No medications on file  Modified Medications   Modified Medication Previous Medication   PROPYLTHIOURACIL (PTU) 50 MG TABLET propylthiouracil (PTU) 50 MG tablet      Take 2 tablets (100 mg total) by  mouth 2 (two) times daily.    Take 1 tablet (50 mg total) by mouth 2 (two) times daily.  Discontinued Medications   No medications on file    Past Medical History Past Medical History:  Diagnosis Date   Anxiety    Asthma    Chlamydia    Depression    was rx meds, has not picked them up   Eczema    Genital herpes    HA (headache)    Infection    UTI    Past Surgical History Past Surgical History:  Procedure Laterality Date   NO PAST SURGERIES     None     WISDOM TOOTH EXTRACTION      Family History family history includes Breast cancer in her mother; Cancer in her maternal grandmother.  Social History Social History   Socioeconomic History   Marital status: Single    Spouse name: Not on file   Number of children: 0   Years of education: 12   Highest education level: Not on file  Occupational History    Comment: High school student  Tobacco Use   Smoking status: Never   Smokeless tobacco: Never  Vaping Use   Vaping Use: Never used  Substance and Sexual Activity   Alcohol use: No   Drug use: Yes    Frequency: 7.0 times per week    Types: Marijuana   Sexual activity: Yes    Birth control/protection: Implant  Other Topics Concern  Not on file  Social History Narrative   Patient lives at home with her father Shauntelle Jamerson).   Patient is a high Ecologist.   Right handed.   Caffeine soda one daily.   Social Determinants of Health   Financial Resource Strain: Not on file  Food Insecurity: Not on file  Transportation Needs: Not on file  Physical Activity: Not on file  Stress: Not on file  Social Connections: Not on file  Intimate Partner Violence: Not on file    Laboratory Investigations Lab Results  Component Value Date   TSH <0.01 (L) 04/06/2023   TSH <0.01 Repeated and verified X2. (L) 03/20/2023   FREET4 2.05 (H) 04/06/2023   FREET4 3.16 (H) 03/20/2023     Lab Results  Component Value Date   TSI 306 (H) 03/20/2023     No components  found for: "TRAB"   No results found for: "CHOL" No results found for: "HDL" No results found for: "LDLCALC" No results found for: "TRIG" No results found for: "CHOLHDL" Lab Results  Component Value Date   CREATININE 0.58 06/22/2019   No results found for: "GFR"    Component Value Date/Time   NA 139 06/22/2019 1405   K 3.6 06/22/2019 1405   CL 106 06/22/2019 1405   CO2 25 06/22/2019 1405   GLUCOSE 81 06/22/2019 1405   BUN 13 06/22/2019 1405   CREATININE 0.58 06/22/2019 1405   CALCIUM 9.4 06/22/2019 1405   PROT 7.8 06/22/2019 1405   ALBUMIN 4.7 06/22/2019 1405   AST 18 06/22/2019 1405   ALT 13 06/22/2019 1405   ALKPHOS 45 06/22/2019 1405   BILITOT 1.2 06/22/2019 1405   GFRNONAA >60 06/22/2019 1405   GFRAA >60 06/22/2019 1405      Latest Ref Rng & Units 06/22/2019    2:05 PM 08/22/2018    1:03 AM 01/02/2018   12:35 AM  BMP  Glucose 70 - 99 mg/dL 81  88  93   BUN 6 - 20 mg/dL 13  14  14    Creatinine 0.44 - 1.00 mg/dL 9.14  7.82  9.56   Sodium 135 - 145 mmol/L 139  139  139   Potassium 3.5 - 5.1 mmol/L 3.6  3.5  4.1   Chloride 98 - 111 mmol/L 106  108  107   CO2 22 - 32 mmol/L 25  24  24    Calcium 8.9 - 10.3 mg/dL 9.4  9.1  9.4        Component Value Date/Time   WBC 3.4 (L) 06/22/2019 1405   RBC 4.43 06/22/2019 1405   HGB 12.1 06/22/2019 1405   HCT 38.0 06/22/2019 1405   PLT 200 06/22/2019 1405   MCV 85.8 06/22/2019 1405   MCH 27.3 06/22/2019 1405   MCHC 31.8 06/22/2019 1405   RDW 12.6 06/22/2019 1405   LYMPHSABS 2.0 06/22/2019 1405   MONOABS 0.4 06/22/2019 1405   EOSABS 0.0 06/22/2019 1405   BASOSABS 0.1 06/22/2019 1405      Parts of this note may have been dictated using voice recognition software. There may be variances in spelling and vocabulary which are unintentional. Not all errors are proofread. Please notify the Thereasa Parkin if any discrepancies are noted or if the meaning of any statement is not clear.

## 2023-04-17 ENCOUNTER — Emergency Department (HOSPITAL_COMMUNITY)
Admission: EM | Admit: 2023-04-17 | Discharge: 2023-04-17 | Disposition: A | Discharge status: Home / Self Care | Payer: Managed Care, Other (non HMO) | Attending: Emergency Medicine | Admitting: Emergency Medicine

## 2023-04-17 ENCOUNTER — Emergency Department (HOSPITAL_COMMUNITY): Payer: Managed Care, Other (non HMO)

## 2023-04-17 ENCOUNTER — Other Ambulatory Visit: Payer: Self-pay

## 2023-04-17 ENCOUNTER — Encounter (HOSPITAL_COMMUNITY): Payer: Self-pay

## 2023-04-17 DIAGNOSIS — E0590 Thyrotoxicosis, unspecified without thyrotoxic crisis or storm: Secondary | ICD-10-CM | POA: Diagnosis not present

## 2023-04-17 DIAGNOSIS — O219 Vomiting of pregnancy, unspecified: Secondary | ICD-10-CM | POA: Insufficient documentation

## 2023-04-17 DIAGNOSIS — O99281 Endocrine, nutritional and metabolic diseases complicating pregnancy, first trimester: Secondary | ICD-10-CM | POA: Diagnosis not present

## 2023-04-17 DIAGNOSIS — J45909 Unspecified asthma, uncomplicated: Secondary | ICD-10-CM | POA: Insufficient documentation

## 2023-04-17 DIAGNOSIS — N946 Dysmenorrhea, unspecified: Secondary | ICD-10-CM

## 2023-04-17 DIAGNOSIS — Z3A08 8 weeks gestation of pregnancy: Secondary | ICD-10-CM | POA: Insufficient documentation

## 2023-04-17 DIAGNOSIS — O99891 Other specified diseases and conditions complicating pregnancy: Secondary | ICD-10-CM | POA: Diagnosis present

## 2023-04-17 DIAGNOSIS — E049 Nontoxic goiter, unspecified: Secondary | ICD-10-CM | POA: Diagnosis not present

## 2023-04-17 HISTORY — DX: Dysmenorrhea, unspecified: N94.6

## 2023-04-17 LAB — CBC WITH DIFFERENTIAL/PLATELET
Abs Immature Granulocytes: 0.06 10*3/uL (ref 0.00–0.07)
Basophils Absolute: 0 10*3/uL (ref 0.0–0.1)
Basophils Relative: 1 %
Eosinophils Absolute: 0.1 10*3/uL (ref 0.0–0.5)
Eosinophils Relative: 1 %
HCT: 36.3 % (ref 36.0–46.0)
Hemoglobin: 11.7 g/dL — ABNORMAL LOW (ref 12.0–15.0)
Immature Granulocytes: 1 %
Lymphocytes Relative: 33 %
Lymphs Abs: 1.9 10*3/uL (ref 0.7–4.0)
MCH: 24.3 pg — ABNORMAL LOW (ref 26.0–34.0)
MCHC: 32.2 g/dL (ref 30.0–36.0)
MCV: 75.5 fL — ABNORMAL LOW (ref 80.0–100.0)
Monocytes Absolute: 0.6 10*3/uL (ref 0.1–1.0)
Monocytes Relative: 10 %
Neutro Abs: 3.1 10*3/uL (ref 1.7–7.7)
Neutrophils Relative %: 54 %
Platelets: 271 10*3/uL (ref 150–400)
RBC: 4.81 MIL/uL (ref 3.87–5.11)
RDW: 14.6 % (ref 11.5–15.5)
WBC: 5.7 10*3/uL (ref 4.0–10.5)
nRBC: 0 % (ref 0.0–0.2)

## 2023-04-17 LAB — COMPREHENSIVE METABOLIC PANEL
ALT: 20 U/L (ref 0–44)
AST: 18 U/L (ref 15–41)
Albumin: 4 g/dL (ref 3.5–5.0)
Alkaline Phosphatase: 72 U/L (ref 38–126)
Anion gap: 8 (ref 5–15)
BUN: 9 mg/dL (ref 6–20)
CO2: 24 mmol/L (ref 22–32)
Calcium: 9.5 mg/dL (ref 8.9–10.3)
Chloride: 104 mmol/L (ref 98–111)
Creatinine, Ser: 0.44 mg/dL (ref 0.44–1.00)
GFR, Estimated: >60 mL/min (ref >60)
Glucose, Bld: 89 mg/dL (ref 70–99)
Potassium: 3.7 mmol/L (ref 3.5–5.1)
Sodium: 136 mmol/L (ref 135–145)
Total Bilirubin: 0.7 mg/dL (ref 0.3–1.2)
Total Protein: 7 g/dL (ref 6.5–8.1)

## 2023-04-17 LAB — TSH: TSH: <0.01 u[IU]/mL — ABNORMAL LOW (ref 0.350–4.500)

## 2023-04-17 LAB — HCG, QUANTITATIVE, PREGNANCY: hCG, Beta Chain, Quant, S: 118724 m[IU]/mL — ABNORMAL HIGH (ref <5)

## 2023-04-17 LAB — T4, FREE: Free T4: 1.35 ng/dL — ABNORMAL HIGH (ref 0.61–1.12)

## 2023-04-17 MED ORDER — DOXYLAMINE-PYRIDOXINE 10-10 MG PO TBEC: DELAYED_RELEASE_TABLET | ORAL | 1 refills | Status: DC

## 2023-04-17 MED ORDER — PROPYLTHIOURACIL 50 MG PO TABS
100.0000 mg | ORAL_TABLET | Freq: Once | ORAL | Status: AC
  Administered 2023-04-17: 100 mg via ORAL
  Filled 2023-04-17: qty 2

## 2023-04-17 MED ORDER — LACTATED RINGERS IV BOLUS
1000.0000 mL | Freq: Once | INTRAVENOUS | Status: AC
  Administered 2023-04-17: 1000 mL via INTRAVENOUS

## 2023-04-17 MED ORDER — ONDANSETRON 4 MG PO TBDP: 4.0000 mg | ORAL_TABLET | Freq: Three times a day (TID) | ORAL | 0 refills | Status: DC | PRN

## 2023-04-17 MED ORDER — ONDANSETRON HCL 4 MG/2ML IJ SOLN
4.0000 mg | Freq: Once | INTRAMUSCULAR | Status: AC
  Administered 2023-04-17: 4 mg via INTRAVENOUS
  Filled 2023-04-17: qty 2

## 2023-04-17 NOTE — ED Provider Notes (Signed)
[facility] AT [facility] Provider Note   CSN: [id number] Arrival date & time: 04/17/23  1031     History  Chief Complaint  Patient presents with   Oral Swelling    Rebekah Rhodes is a 27 y.o. female G1P0 with graves disease on PTU, migraines, and MDD who presents with neck swelling, nausea/vomiting.   Pt coming in today complaining of a swelling in the neck area that began Friday. Pt is unsure if it her throat or thyroid (hx of graves disease on PTU). Takes PTU 200 mg BID normally, but she has had so much nausea/vomiting in this pregnancy that for the last 3-4 days she hasn't been able to keep her meds down.  She denies abdominal pain, vaginal bleeding, gush of fluid.  She is [redacted] weeks pregnant and has in her first pregnancy.  She has already had an ultrasound with OB that she was told was okay.  She denies any sensation of oral or laryngeal swelling, no muffled voice, difficulty breathing.  She does state that she felt as though she had some difficulty breathing while laying down to sleep last night which made her very nervous and is what prompted her to come in today.  She is never had anything like this happen with her thyroid before.  She denies any chest pain, palpitations, leg swelling, headache but does endorse eye watering bilaterally. Thyroid is not painful but is mildly TTP.  Per chart review, patient recently saw [facility] endocrinology on 03/22/2023 and 04/10/2023.  Was recently diagnosed with hyperthyroidism Graves' disease and started on PTU but has had difficulty tolerating it due to pregnancy.  Endocrine note documented thyromegaly noted without obstructive symptoms and there was plan for thyroid ultrasound as an outpatient.  Labs demonstrate TSH has been less than 0.01 but free T4 has been slowly decreasing, last was 1.92 on 04/10/23.   HPI     Home Medications Prior to Admission medications   Medication Sig Start Date End Date Taking? Authorizing  Provider  Doxylamine-Pyridoxine 10-10 MG TBEC Initial, 2 tablets (doxylamine succinate 10 mg/pyridoxine hydrochloride 10 mg per tablet) orally at bedtime on day 1 and 2; if symptoms persist, take 1 tablet in morning and 2 tablets at bedtime on day 3; if symptoms persist, may increase to MAX 4 tablets per day, administered as 1 tablet in the morning, 1 tablet in mid-afternoon and 2 tablets at bedtime 04/17/23  Yes Loetta Rough, MD  ondansetron (ZOFRAN-ODT) 4 MG disintegrating tablet Take 1 tablet (4 mg total) by mouth every 8 (eight) hours as needed for nausea or vomiting. 04/17/23  Yes Loetta Rough, MD  propylthiouracil (PTU) 50 MG tablet Take 2 tablets (100 mg total) by mouth 2 (two) times daily. 04/10/23   [place], MD      Allergies    Amoxicillin and Penicillins    Review of Systems   Review of Systems A 10 point review of systems was performed and is negative unless otherwise reported in HPI.  Physical Exam Updated Vital Signs BP 128/83   Pulse 86   Temp 98.5 F (36.9 C)   Resp (!) 22   Ht 5\' 7"  (1.702 m)   Wt 53.5 kg   LMP 02/16/2023   SpO2 96%   BMI 18.48 kg/m  Physical Exam General: Normal appearing female, lying in bed.  HEENT: PERRLA, Sclera anicteric, MMM, trachea midline. No nuchal rigidity. No obvious submandibular or neck swelling. Thyroid palpable bilaterally with mild  TTP, does not feel significantly enlarged and without any nodules/masses palpable. No muffled voice.  Cardiology: RRR, no murmurs/rubs/gallops. BL radial and DP pulses equal bilaterally.  Resp: Normal respiratory rate and effort. CTAB, no wheezes, rhonchi, crackles.  Abd: Soft, non-tender, non-distended. No rebound tenderness or guarding.  GU: Deferred. MSK: No peripheral edema or signs of trauma. Extremities without deformity or TTP. No cyanosis or clubbing. Skin: warm, dry.  Neuro: A&Ox4, CNs II-XII grossly intact. MAEs. Sensation grossly intact.  Psych: Normal mood and affect.   ED  Results / Procedures / Treatments   Labs (all labs ordered are listed, but only abnormal results are displayed) Labs Reviewed  CBC WITH DIFFERENTIAL/PLATELET - Abnormal; Notable for the following components:      Result Value   Hemoglobin 11.7 (*)    MCV 75.5 (*)    MCH 24.3 (*)    All other components within normal limits  T4, FREE - Abnormal; Notable for the following components:   Free T4 1.35 (*)    All other components within normal limits  TSH - Abnormal; Notable for the following components:   TSH <0.010 (*)    All other components within normal limits  HCG, QUANTITATIVE, PREGNANCY - Abnormal; Notable for the following components:   hCG, Beta Francene Finders 161,096 (*)    All other components within normal limits  COMPREHENSIVE METABOLIC PANEL    EKG EKG Interpretation  Date/Time:  Monday April 17 2023 11:25:32 EDT Ventricular Rate:  95 PR Interval:  161 QRS Duration: 82 QT Interval:  291 QTC Calculation: 366 R Axis:   55 Text Interpretation: Sinus rhythm Confirmed by Vivi Barrack [phone]) on 04/17/2023 11:58:22 AM  Radiology US THYROID  Result Date: 04/17/2023 CLINICAL DATA:  Neck fullness and swelling. History of grave's disease. EXAM: THYROID ULTRASOUND TECHNIQUE: Ultrasound examination of the thyroid gland and adjacent soft tissues was performed. COMPARISON:  None Available. FINDINGS: Parenchymal Echotexture: Moderately heterogenous Isthmus: 0.9 cm Right lobe: 5.5 x 2.0 x 2.0 cm Left lobe: 5.2 x 1.9 x 2.0 cm _________________________________________________________ Estimated total number of nodules >/= 1 cm: 0 Number of spongiform nodules >/=  2 cm not described below (TR1): 0 Number of mixed cystic and solid nodules >/= 1.5 cm not described below (TR2): 0 _________________________________________________________ No discrete nodules are seen within the thyroid gland. The thyroid parenchyma demonstrates subjectively increased vascularity by color Doppler. No enlarged or  abnormal appearing lymph nodes are identified. IMPRESSION: Mildly enlarged and moderately heterogeneous thyroid gland with subjectively increased vascularity. No discrete thyroid nodules identified. The above is in keeping with the ACR TI-RADS recommendations - J Am Coll Radiol 2017;14:587-595. Electronically Signed   By: Irish Lack M.D.   On: 04/17/2023 13:19    Procedures Procedures    Medications Ordered in ED Medications  lactated ringers bolus 1,000 mL (1,000 mLs Intravenous New Bag/Given 04/17/23 1231)  ondansetron (ZOFRAN) injection 4 mg (4 mg Intravenous Given 04/17/23 1232)  propylthiouracil (PTU) tablet 100 mg (100 mg Oral Given 04/17/23 1358)    ED Course/ Medical Decision Making/ A&P                          Medical Decision Making Amount and/or Complexity of Data Reviewed Labs: ordered. Decision-making details documented in ED Course. Radiology: ordered. Decision-making details documented in ED Course.  Risk Prescription drug management.    This patient presents to the ED for concern of neck swelling, h/o graves disease in pregnancy,  this involves an extensive number of treatment options, and is a complaint that carries with it a high risk of complications and morbidity.  I considered the following differential and admission for this acute, potentially life threatening condition.   MDM:    Pt has h/o graves disease on PTU. Patient doesn't have pain in thyroid gland, consider known graves disease, subacute painless thyroiditis, lower c/f suppurative thyroiditis. Must also consider molar pregnancy given [redacted] weeks pregnant, but patient had an TV OB ultrasound on 04/04/23 that showed single live IUP. Consider thyroid cancer or tumor.  Patient presents tachycardic, consider spectrum of hyperthyroidism and thyrotoxicosis. Could have developing thyrotoxicosis precipitated by her lack of being able to keep down her PTU or her pregnancy itself. However, her tachycardia resolves with  fluids and is likely d/t hypovolemia in s/o pregnancy/GI losses. Patient is not hyperthermic and is A&Ox4, lower likelihood for thyrotoxicosis. Patient is N/V, consider electrolyte derangements or renal injury. Consider hyperemesis gravidarum as well. Patient had planned for o/p thyroid ultrasound and will obtain it here.   Clinical Course as of 04/17/23 1430  Mon Apr 17, 2023  1300 HCG, Beta Francene Finders(!): 161,096 C/w dates [HN]  1300 Comprehensive metabolic panel wnl [HN]  1300 WBC: 5.7 No leukocytosis  [HN]  1300 Hemoglobin(!): 11.7 C/w priors [HN]  1407 T4,Free(Direct)(!): 1.35 Decreased from 1.92. [HN]  1408 US THYROID Mildly enlarged and moderately heterogeneous thyroid gland with subjectively increased vascularity. No discrete thyroid nodules identified.   [HN]  1421 Pt reevaluated. She is still airway intact, no resp distress. No significant thyroid tenderness to palpation, no obvious goiter or observed neck swelling on exam. Pt HDS, well-appearing. She has tolerated PO while here and tolerated her PTU dose. Likely patient feeling dehydrated, symptmoatic from hyperthyroid in s/o N/V in pregnancy and missing PTU doses for several days. Encouraged her to f/u with her endocrinologist within the next 1-2 weeks and call them today to let them know she came here and had labs/thyroid US. Will rx diclegis as first line and zofran ODT as second line for N/V at home. Instructed to control N/V so she is able to be compliant with her PTU.  Gave her extensive DC instructions/return precautions, especially specifically related to goiter/airway/neck swelling. Pt reports understanding.  [HN]    Clinical Course User Index [HN] Loetta Rough, MD    Labs: I Ordered, and personally interpreted labs.  The pertinent results include:  those listed above  Imaging Studies ordered: I ordered imaging studies including US thyroid I independently visualized and interpreted imaging. I agree with the  radiologist interpretation  Additional history obtained from chart review.    Cardiac Monitoring: The patient was maintained on a cardiac monitor.  I personally viewed and interpreted the cardiac monitored which showed an underlying rhythm of: NSR  Reevaluation: After the interventions noted above, I reevaluated the patient and found that they have :improved  Social Determinants of Health: Patient lives independently   Disposition:  DC  Co morbidities that complicate the patient evaluation  Past Medical History:  Diagnosis Date   Anxiety    Asthma    Chlamydia    Depression    was rx meds, has not picked them up   Eczema    Genital herpes    HA (headache)    Infection    UTI     Medicines Meds ordered this encounter  Medications   lactated ringers bolus 1,000 mL   ondansetron (ZOFRAN) injection 4 mg  propylthiouracil (PTU) tablet 100 mg   Doxylamine-Pyridoxine 10-10 MG TBEC    Sig: Initial, 2 tablets (doxylamine succinate 10 mg/pyridoxine hydrochloride 10 mg per tablet) orally at bedtime on day 1 and 2; if symptoms persist, take 1 tablet in morning and 2 tablets at bedtime on day 3; if symptoms persist, may increase to MAX 4 tablets per day, administered as 1 tablet in the morning, 1 tablet in mid-afternoon and 2 tablets at bedtime    Dispense:  60 tablet    Refill:  1   ondansetron (ZOFRAN-ODT) 4 MG disintegrating tablet    Sig: Take 1 tablet (4 mg total) by mouth every 8 (eight) hours as needed for nausea or vomiting.    Dispense:  20 tablet    Refill:  0    I have reviewed the patients home medicines and have made adjustments as needed  Problem List / ED Course: Problem List Items Addressed This Visit   None Visit Diagnoses     Hyperthyroidism affecting pregnancy in first trimester    -  Primary   Relevant Medications   propylthiouracil (PTU) tablet 100 mg (Completed)   Nausea and vomiting during pregnancy prior to [redacted] weeks gestation       Enlarged  thyroid gland       Relevant Medications   propylthiouracil (PTU) tablet 100 mg (Completed)                   This note was created using dictation software, which may contain spelling or grammatical errors.    Loetta Rough, MD 04/17/23 [phone]

## 2023-04-17 NOTE — ED Notes (Signed)
US at bedside

## 2023-04-17 NOTE — ED Triage Notes (Addendum)
Pt coming in today complaining of a swelling in the throat area, emesis that began Friday. Pt is unsure if it her throat or thyroid (hx of graves disease). Pt is able to maintain secretions, and talk in full sentences in triage. Pt is also [redacted] wks pregnant.

## 2023-04-17 NOTE — Discharge Instructions (Addendum)
Thank you for coming to [facility] Emergency Department. You were seen for nausea/vomiting, sensation of neck swelling. We did an exam, labs, and imaging, and these showed no acute findings.  Likely you are feeling dehydrated and nausea vomiting from the pregnancy and from your lack of ability to take your PTU due to your vomiting.  We have prescribed a Diclegis which should be your first-line for nausea vomiting in pregnancy.  If this is not sufficient, you can take Zofran under the tongue 4 mg every 8 hours as needed. Please stay well hydrated and take your PTU every day as prescribed.   Your thyroid ultrasound showed a moderately enlarged and hetergeneous thyroid gland. Call your endocrinologist today and let him know that we got the ultrasound today, that your thyroid studies came back decreasing appropriately. Please follow up with your endocrinologist within 1-2 weeks.   Do not hesitate to return to the ED or call 911 if you experience: -Worsening symptoms -Sensation of throat swelling -Difficulty breathing -Chest pain, palpitations -Nausea/vomiting so severe you cannot eat/drink anything, cannot keep your medications down -Lightheadedness, passing out -Fevers/chills -Anything else that concerns you

## 2023-04-18 ENCOUNTER — Other Ambulatory Visit: Payer: Self-pay | Admitting: "Endocrinology

## 2023-04-18 DIAGNOSIS — E0500 Thyrotoxicosis with diffuse goiter without thyrotoxic crisis or storm: Secondary | ICD-10-CM

## 2023-05-02 ENCOUNTER — Other Ambulatory Visit: Payer: Managed Care, Other (non HMO)

## 2023-05-03 ENCOUNTER — Other Ambulatory Visit: Payer: Self-pay

## 2023-05-03 DIAGNOSIS — E0500 Thyrotoxicosis with diffuse goiter without thyrotoxic crisis or storm: Secondary | ICD-10-CM

## 2023-05-03 NOTE — Addendum Note (Signed)
Addended by: Holly Iannaccone C on: 05/03/2023 03:31 PM   Modules accepted: Orders  

## 2023-05-03 NOTE — Addendum Note (Signed)
Addended by: Ilda Foil on: 05/03/2023 03:31 PM   Modules accepted: Orders

## 2023-05-03 NOTE — Addendum Note (Signed)
Addended by: Johncarlos Holtsclaw C on: 05/03/2023 03:31 PM   Modules accepted: Orders  

## 2023-05-04 LAB — T4, FREE: Free T4: 1.3 ng/dL (ref 0.8–1.8)

## 2023-05-04 LAB — TSH: TSH: <0.01 mIU/L — ABNORMAL LOW

## 2023-05-04 LAB — T3, FREE: T3, Free: 4.3 pg/mL — ABNORMAL HIGH (ref 2.3–4.2)

## 2023-05-08 ENCOUNTER — Ambulatory Visit: Payer: Managed Care, Other (non HMO) | Admitting: "Endocrinology

## 2023-05-21 ENCOUNTER — Encounter (HOSPITAL_COMMUNITY): Payer: Self-pay | Admitting: Obstetrics and Gynecology

## 2023-05-21 ENCOUNTER — Inpatient Hospital Stay (HOSPITAL_COMMUNITY)
Admission: AD | Admit: 2023-05-21 | Discharge: 2023-05-21 | Disposition: A | Discharge status: Home / Self Care | Payer: Medicaid Other | Attending: Obstetrics and Gynecology | Admitting: Obstetrics and Gynecology

## 2023-05-21 ENCOUNTER — Other Ambulatory Visit: Payer: Self-pay

## 2023-05-21 DIAGNOSIS — Z3A13 13 weeks gestation of pregnancy: Secondary | ICD-10-CM | POA: Diagnosis not present

## 2023-05-21 DIAGNOSIS — O26891 Other specified pregnancy related conditions, first trimester: Secondary | ICD-10-CM | POA: Insufficient documentation

## 2023-05-21 DIAGNOSIS — R109 Unspecified abdominal pain: Secondary | ICD-10-CM | POA: Diagnosis not present

## 2023-05-21 DIAGNOSIS — R1031 Right lower quadrant pain: Secondary | ICD-10-CM | POA: Diagnosis present

## 2023-05-21 DIAGNOSIS — O26899 Other specified pregnancy related conditions, unspecified trimester: Secondary | ICD-10-CM

## 2023-05-21 LAB — URINALYSIS, ROUTINE W REFLEX MICROSCOPIC
Bilirubin Urine: NEGATIVE
Glucose, UA: NEGATIVE mg/dL
Hgb urine dipstick: NEGATIVE
Ketones, ur: NEGATIVE mg/dL
Leukocytes,Ua: NEGATIVE
Nitrite: NEGATIVE
Protein, ur: NEGATIVE mg/dL
Specific Gravity, Urine: 1.021 (ref 1.005–1.030)
pH: 6 (ref 5.0–8.0)

## 2023-05-21 LAB — COMPREHENSIVE METABOLIC PANEL
ALT: 11 U/L (ref 0–44)
AST: 15 U/L (ref 15–41)
Albumin: 3.4 g/dL — ABNORMAL LOW (ref 3.5–5.0)
Alkaline Phosphatase: 59 U/L (ref 38–126)
Anion gap: 8 (ref 5–15)
BUN: 6 mg/dL (ref 6–20)
CO2: 22 mmol/L (ref 22–32)
Calcium: 8.8 mg/dL — ABNORMAL LOW (ref 8.9–10.3)
Chloride: 104 mmol/L (ref 98–111)
Creatinine, Ser: 0.53 mg/dL (ref 0.44–1.00)
GFR, Estimated: >60 mL/min (ref >60)
Glucose, Bld: 89 mg/dL (ref 70–99)
Potassium: 3.6 mmol/L (ref 3.5–5.1)
Sodium: 134 mmol/L — ABNORMAL LOW (ref 135–145)
Total Bilirubin: 0.2 mg/dL — ABNORMAL LOW (ref 0.3–1.2)
Total Protein: 6.2 g/dL — ABNORMAL LOW (ref 6.5–8.1)

## 2023-05-21 LAB — CBC
HCT: 33.2 % — ABNORMAL LOW (ref 36.0–46.0)
Hemoglobin: 10.7 g/dL — ABNORMAL LOW (ref 12.0–15.0)
MCH: 25.2 pg — ABNORMAL LOW (ref 26.0–34.0)
MCHC: 32.2 g/dL (ref 30.0–36.0)
MCV: 78.1 fL — ABNORMAL LOW (ref 80.0–100.0)
Platelets: 251 10*3/uL (ref 150–400)
RBC: 4.25 MIL/uL (ref 3.87–5.11)
RDW: 16.4 % — ABNORMAL HIGH (ref 11.5–15.5)
WBC: 6 10*3/uL (ref 4.0–10.5)
nRBC: 0 % (ref 0.0–0.2)

## 2023-05-21 NOTE — MAU Provider Note (Signed)
History     CSN: [id number]  Arrival date and time: 05/21/23 1312   Event Date/Time   First Provider Initiated Contact with Patient 05/21/23 1349      Chief Complaint  Patient presents with   Abdominal Pain   Rebekah Rhodes is a 27 y.o. G2P0000 at [redacted]w[redacted]d who presents with right abdominal pain. This pain has been intermittent for 2 weeks but is now constant. She had a sharp pain that woke her up and it is now a dull ache. She denies fever, nausea, vomiting, and back pain. She denies dysuria, vaginal bleeding, and LOF. She was seen for abdominal pain and cramping in the office and was told her fetal US was normal and the cramping pains were normal.    OB History     Gravida  2   Para  0   Term  0   Preterm  0   AB  0   Living  0      SAB  0   IAB  0   Ectopic  0   Multiple  0   Live Births              Past Medical History:  Diagnosis Date   Anxiety    Asthma    Chlamydia    Depression    was rx meds, has not picked them up   Eczema    Genital herpes    HA (headache)    Infection    UTI    Past Surgical History:  Procedure Laterality Date   NO PAST SURGERIES     None     WISDOM TOOTH EXTRACTION      Family History  Problem Relation Age of Onset   Breast cancer Mother    Cancer Maternal Grandmother        breast; also great grandma    Social History   Tobacco Use   Smoking status: Never   Smokeless tobacco: Never  Vaping Use   Vaping status: Never Used  Substance Use Topics   Alcohol use: No   Drug use: Not Currently    Frequency: 7.0 times per week    Types: Marijuana    Allergies:  Allergies  Allergen Reactions   Amoxicillin Hives, Diarrhea and Nausea And Vomiting    Childhood allergy Has patient had a PCN reaction causing immediate rash, facial/tongue/throat swelling, SOB or lightheadedness with hypotension: Yes Has patient had a PCN reaction causing severe rash involving mucus membranes or skin necrosis: No Has patient  had a PCN reaction that required hospitalization: No Has patient had a PCN reaction occurring within the last 10 years: No If all of the above answers are "NO", then may proceed with Cephalosporin use.   Penicillins Hives, Diarrhea and Nausea And Vomiting    Has patient had a PCN reaction causing immediate rash, facial/tongue/throat swelling, SOB or lightheadedness with hypotension: Yes Has patient had a PCN reaction causing severe rash involving mucus membranes or skin necrosis: No Has patient had a PCN reaction that required hospitalization No Has patient had a PCN reaction occurring within the last 10 years: No If all of the above answers are "NO", then may proceed with Cephalosporin    Medications Prior to Admission  Medication Sig Dispense Refill Last Dose   propylthiouracil (PTU) 50 MG tablet Take 2 tablets (100 mg total) by mouth 2 (two) times daily. 120 tablet 2 Past Month   Doxylamine-Pyridoxine 10-10 MG TBEC Initial, 2 tablets (doxylamine  succinate 10 mg/pyridoxine hydrochloride 10 mg per tablet) orally at bedtime on day 1 and 2; if symptoms persist, take 1 tablet in morning and 2 tablets at bedtime on day 3; if symptoms persist, may increase to MAX 4 tablets per day, administered as 1 tablet in the morning, 1 tablet in mid-afternoon and 2 tablets at bedtime 60 tablet 1 Unknown   ondansetron (ZOFRAN-ODT) 4 MG disintegrating tablet Take 1 tablet (4 mg total) by mouth every 8 (eight) hours as needed for nausea or vomiting. 20 tablet 0 Unknown    Review of Systems  Constitutional:  Negative for appetite change and fever.  Eyes:  Negative for visual disturbance.  Respiratory:  Negative for shortness of breath.   Cardiovascular:  Negative for chest pain and leg swelling.  Gastrointestinal:  Positive for abdominal pain. Negative for constipation, diarrhea, nausea and vomiting.  Genitourinary:  Negative for difficulty urinating, dysuria and vaginal bleeding.  Musculoskeletal:  Negative  for back pain and joint swelling.  Neurological:  Negative for headaches.  Psychiatric/Behavioral:  Negative for sleep disturbance.    Physical Exam   Blood pressure 134/75, pulse 85, temperature 98.9 F (37.2 C), temperature source Oral, resp. rate 16, height 5\' 7"  (1.702 m), weight 58.4 kg, last menstrual period 02/16/2023, SpO2 100%, unknown if currently breastfeeding.  Physical Exam Vitals and nursing note reviewed.  Constitutional:      General: She is not in acute distress.    Appearance: She is not ill-appearing, toxic-appearing or diaphoretic.  Eyes:     Extraocular Movements: Extraocular movements intact.  Cardiovascular:     Rate and Rhythm: Normal rate and regular rhythm.  Pulmonary:     Effort: Pulmonary effort is normal.     Breath sounds: Normal breath sounds.  Abdominal:     Palpations: Abdomen is soft. There is no hepatomegaly, splenomegaly or mass.     Tenderness: There is abdominal tenderness in the right upper quadrant and right lower quadrant. There is right CVA tenderness. There is no left CVA tenderness, guarding or rebound. Positive signs include McBurney's sign. Negative signs include Murphy's sign.     Comments: gravid  Neurological:     Mental Status: She is alert and oriented to person, place, and time.  Psychiatric:        Mood and Affect: Mood normal.        Behavior: Behavior normal.    MAU Course  Procedures  MDM Results for orders placed or performed during the hospital encounter of 05/21/23 (from the past 24 hour(s))  Urinalysis, Routine w reflex microscopic -Urine, Clean Catch     Status: None   Collection Time: 05/21/23  1:54 PM  Result Value Ref Range   Color, Urine YELLOW YELLOW   APPearance CLEAR CLEAR   Specific Gravity, Urine 1.021 1.005 - 1.030   pH 6.0 5.0 - 8.0   Glucose, UA NEGATIVE NEGATIVE mg/dL   Hgb urine dipstick NEGATIVE NEGATIVE   Bilirubin Urine NEGATIVE NEGATIVE   Ketones, ur NEGATIVE NEGATIVE mg/dL   Protein, ur  NEGATIVE NEGATIVE mg/dL   Nitrite NEGATIVE NEGATIVE   Leukocytes,Ua NEGATIVE NEGATIVE  CBC     Status: Abnormal   Collection Time: 05/21/23  2:20 PM  Result Value Ref Range   WBC 6.0 4.0 - 10.5 K/uL   RBC 4.25 3.87 - 5.11 MIL/uL   Hemoglobin 10.7 (L) 12.0 - 15.0 g/dL   HCT 60.6 (L) 30.1 - 60.1 %   MCV 78.1 (L) 80.0 - 100.0 fL  MCH 25.2 (L) 26.0 - 34.0 pg   MCHC 32.2 30.0 - 36.0 g/dL   RDW 16.1 (H) 09.6 - 04.5 %   Platelets 251 150 - 400 K/uL   nRBC 0.0 0.0 - 0.2 %  Comprehensive metabolic panel     Status: Abnormal   Collection Time: 05/21/23  2:20 PM  Result Value Ref Range   Sodium 134 (L) 135 - 145 mmol/L   Potassium 3.6 3.5 - 5.1 mmol/L   Chloride 104 98 - 111 mmol/L   CO2 22 22 - 32 mmol/L   Glucose, Bld 89 70 - 99 mg/dL   BUN 6 6 - 20 mg/dL   Creatinine, Ser 4.09 0.44 - 1.00 mg/dL   Calcium 8.8 (L) 8.9 - 10.3 mg/dL   Total Protein 6.2 (L) 6.5 - 8.1 g/dL   Albumin 3.4 (L) 3.5 - 5.0 g/dL   AST 15 15 - 41 U/L   ALT 11 0 - 44 U/L   Alkaline Phosphatase 59 38 - 126 U/L   Total Bilirubin 0.2 (L) 0.3 - 1.2 mg/dL   GFR, Estimated >81 >19 mL/min   Anion gap 8 5 - 15   Upon reassessment abdominal pain improved and is no longer constant. Declined tylenol.    Assessment and Plan  1. Abdominal pain affecting pregnancy Primigravida @[redacted]wks  gestation. Several weeks of intermittent right sided abdominal discomfort that is intermittent. Has a strong right sided pain that woke her up. She was sleeping on this side at the time. Diffuse tenderness to the touch. She is afebrile, normal Diver count, self-limited discomfort that improved in MAU without treatment. Normal UA and LFTs. Suspicion for UTI/pyelo, ICP, and appendicitis low at this time. We discussed precuations and when to return. Likely growing pains from round ligament pain.   2. [redacted] weeks gestation of pregnancy Continue routine OB care.     Yamaris Cummings Autry-Lott 05/21/2023, 1:49 PM

## 2023-05-21 NOTE — MAU Note (Signed)
.  Rebekah Rhodes is a 27 y.o. at [redacted]w[redacted]d here in MAU reporting: right mid/lower abd pain for the past 3 days that has worsened. States pain is constant and sharp. Denies bleeding.  Last took tylenol yesterday  Onset of complaint: 3 days Pain score: 6/10 Vitals:   05/21/23 1325  BP: 132/73  Pulse: 83  Resp: 16  Temp: 98.9 F (37.2 C)  SpO2: 100%     FHT:155  Lab orders placed from triage:   ua

## 2023-06-05 DIAGNOSIS — E0500 Thyrotoxicosis with diffuse goiter without thyrotoxic crisis or storm: Secondary | ICD-10-CM | POA: Insufficient documentation

## 2023-06-05 DIAGNOSIS — Z3490 Encounter for supervision of normal pregnancy, unspecified, unspecified trimester: Secondary | ICD-10-CM | POA: Insufficient documentation

## 2023-06-06 ENCOUNTER — Telehealth: Payer: Self-pay | Admitting: "Endocrinology

## 2023-06-06 NOTE — Telephone Encounter (Signed)
Patient advising she is has received a call advising that she is to have a CT scan done Aug. 8th, be she is advising she just had one don when she went to ED. Patient asking to speak to a clinical staff member. Please advise

## 2023-06-07 NOTE — Telephone Encounter (Signed)
LMTRC  JMiller,RMA 

## 2023-06-08 ENCOUNTER — Ambulatory Visit (HOSPITAL_COMMUNITY): Admission: RE | Admit: 2023-06-08 | Payer: Medicaid Other | Source: Ambulatory Visit

## 2023-06-12 ENCOUNTER — Other Ambulatory Visit: Payer: Self-pay | Admitting: Obstetrics and Gynecology

## 2023-06-12 ENCOUNTER — Telehealth: Payer: Self-pay

## 2023-06-12 DIAGNOSIS — Z363 Encounter for antenatal screening for malformations: Secondary | ICD-10-CM

## 2023-06-30 ENCOUNTER — Ambulatory Visit: Payer: Medicaid Other | Attending: Obstetrics and Gynecology

## 2023-06-30 ENCOUNTER — Other Ambulatory Visit: Payer: Medicaid Other

## 2023-07-21 ENCOUNTER — Inpatient Hospital Stay (HOSPITAL_COMMUNITY)
Admission: AD | Admit: 2023-07-21 | Discharge: 2023-07-21 | Discharge status: Against Medical Advice | Payer: Medicaid Other | Attending: Obstetrics & Gynecology | Admitting: Obstetrics & Gynecology

## 2023-07-21 ENCOUNTER — Encounter (HOSPITAL_COMMUNITY): Payer: Self-pay | Admitting: Obstetrics & Gynecology

## 2023-07-21 DIAGNOSIS — Z5321 Procedure and treatment not carried out due to patient leaving prior to being seen by health care provider: Secondary | ICD-10-CM | POA: Diagnosis not present

## 2023-07-21 DIAGNOSIS — Z3A22 22 weeks gestation of pregnancy: Secondary | ICD-10-CM | POA: Diagnosis not present

## 2023-07-21 DIAGNOSIS — R350 Frequency of micturition: Secondary | ICD-10-CM | POA: Insufficient documentation

## 2023-07-21 DIAGNOSIS — O26892 Other specified pregnancy related conditions, second trimester: Secondary | ICD-10-CM | POA: Insufficient documentation

## 2023-07-21 LAB — URINALYSIS, ROUTINE W REFLEX MICROSCOPIC
Bacteria, UA: NONE SEEN
Bilirubin Urine: NEGATIVE
Glucose, UA: NEGATIVE mg/dL
Hgb urine dipstick: NEGATIVE
Ketones, ur: NEGATIVE mg/dL
Leukocytes,Ua: NEGATIVE
Nitrite: NEGATIVE
Protein, ur: NEGATIVE mg/dL
Specific Gravity, Urine: 1.014 (ref 1.005–1.030)
pH: 7 (ref 5.0–8.0)

## 2023-07-21 NOTE — MAU Note (Signed)
.  Rebekah Rhodes is a 27 y.o. at [redacted]w[redacted]d here in MAU reporting: has been having increased urination over the past 3 days. Denies any pain or burning. Stated she has green colored BM yesterday that was soft.  LMP:  Onset of complaint: 3 days Pain score: 0 Vitals:   07/21/23 1325  BP: 109/63  Pulse: 76  Resp: 18  Temp: 98.9 F (37.2 C)     FHT:153 Lab orders placed from triage:  u/a

## 2023-07-21 NOTE — MAU Note (Signed)
Pt stated she wanted to leave. Informed pt that the provider was reviewing her labs and would talk to her in a few minutes. Pt went back to family room to wait.

## 2023-07-21 NOTE — MAU Note (Signed)
Not in family room when provider went to talk to her.

## 2023-07-21 NOTE — MAU Provider Note (Signed)
Not in family room or lobby x 3 attempts. Left after triage/before being seen by provider.  Raelyn Mora, CNM  07/21/2023 5:25 PM

## 2023-07-28 ENCOUNTER — Ambulatory Visit: Payer: Medicaid Other | Admitting: *Deleted

## 2023-07-28 ENCOUNTER — Ambulatory Visit: Payer: Medicaid Other

## 2023-07-28 ENCOUNTER — Ambulatory Visit: Payer: Medicaid Other | Attending: Obstetrics and Gynecology

## 2023-07-28 ENCOUNTER — Other Ambulatory Visit: Payer: Self-pay | Admitting: *Deleted

## 2023-07-28 ENCOUNTER — Encounter: Payer: Self-pay | Admitting: *Deleted

## 2023-07-28 VITALS — BP 114/61 | HR 81

## 2023-07-28 DIAGNOSIS — E0590 Thyrotoxicosis, unspecified without thyrotoxic crisis or storm: Secondary | ICD-10-CM | POA: Insufficient documentation

## 2023-07-28 DIAGNOSIS — O99282 Endocrine, nutritional and metabolic diseases complicating pregnancy, second trimester: Secondary | ICD-10-CM | POA: Diagnosis present

## 2023-07-28 DIAGNOSIS — Z363 Encounter for antenatal screening for malformations: Secondary | ICD-10-CM | POA: Insufficient documentation

## 2023-08-08 LAB — T4+FREE T4
Free T4 by Dialysis: 0.56 ng/dL
Thyroxine (T4): 8.9 ug/dL

## 2023-08-08 LAB — TSH: TSH: 0.539 u[IU]/mL (ref 0.450–4.500)

## 2023-08-08 LAB — T3: T3, Total: 165 ng/dL (ref 71–180)

## 2023-08-10 ENCOUNTER — Encounter: Payer: Self-pay | Admitting: "Endocrinology

## 2023-08-10 ENCOUNTER — Ambulatory Visit (INDEPENDENT_AMBULATORY_CARE_PROVIDER_SITE_OTHER): Payer: Medicaid Other | Admitting: "Endocrinology

## 2023-08-10 VITALS — BP 115/70 | HR 80 | Ht 67.0 in | Wt 143.0 lb

## 2023-08-10 DIAGNOSIS — Z3A27 27 weeks gestation of pregnancy: Secondary | ICD-10-CM | POA: Diagnosis not present

## 2023-08-10 DIAGNOSIS — E0500 Thyrotoxicosis with diffuse goiter without thyrotoxic crisis or storm: Secondary | ICD-10-CM | POA: Diagnosis not present

## 2023-08-10 NOTE — Progress Notes (Signed)
Outpatient Endocrinology Note Rebekah Tupelo, MD  08/10/23   Rebekah Rhodes 06/29/1996 [id number]  Referring Provider: No ref. provider found Primary Care Provider: Patient, No Pcp Per Subjective  No chief complaint on file.   Assessment & Plan  Diagnoses and all orders for this visit:  Graves disease -     TSH Rfx on Abnormal to Free T4; Future -     T3, free; Future  [redacted] weeks gestation of pregnancy    Rebekah Rhodes is currently not taking any PTU.  She was last on propylthiouracil 100 mg twice daily and she self stopped it around 06/2023 in first trimester.  Patient previously explained the diagnosis of Graves' disease at length and what it means for her and her baby. Patient understands that it can cause tachycardia and to inform her OB/GYN, the neonatologist and pediatrician timely. Explained how autoimmune conditions can run together and to let her PCP know if she experiences any new symptoms. Last thyroid labs WNL. Educated on thyroid axis.  Recommend the following: repeat labs today. Repeat labs sooner if symptoms of hyper or hypothyroidism develop.  Counseled on: -complications of untreated hyperthyroidism including atrial fibrillation, heart failure and osteoporosis -side effects of Methimazole including but not limited to allergic reaction, rash, bone marrow suppression, liver dysfunction and teratogenic potential -implications in pregnancy and breastfeeding -compliance and follow up needs    Thyromegaly noted without obstructive symptoms Ordered thyroid ultrasound: reported thyromegaly without nodules No obstructive symptoms   Recently quit smoking after she found out pregnancy Encouraged and motivated previously   If you notice any symptoms of worsening fatigue, fever with sore throat, loss of appetite, yellowing of eyes, dark urine, joint pains, sores in the mouth, itchy rash, light colored stools or abdominal pain, please stop the medication and call  us immediately as this can be a serious side effect of the medication.   I have reviewed current medications, nurse's notes, allergies, vital signs, past medical and surgical history, family medical history, and social history for this encounter. Counseled patient on symptoms, examination findings, lab findings, imaging results, treatment decisions and monitoring and prognosis. The patient understood the recommendations and agrees with the treatment plan. All questions regarding treatment plan were fully answered.   Return in about 2 months (around 10/10/2023) for visit, labs before next visit.   Rebekah Rewey, MD  08/10/23   I have reviewed current medications, nurse's notes, allergies, vital signs, past medical and surgical history, family medical history, and social history for this encounter. Counseled patient on symptoms, examination findings, lab findings, imaging results, treatment decisions and monitoring and prognosis. The patient understood the recommendations and agrees with the treatment plan. All questions regarding treatment plan were fully answered.   History of Present Illness Rebekah Rhodes is a 27 y.o. year old female who presents to our clinic with hyperthyroidism diagnosed in 2024 found on regular check up.    Pt is [redacted] weeks pregnant with her first child.  Symptoms suggestive of HYPERTHYROIDISM:  weight loss  No, no nausea/vomiting heat intolerance No hyperdefecation  No palpitations  No   Compressive symptoms:  dysphagia  No dysphonia  No positional dyspnea (especially with simultaneous arms elevation)  No  Smokes No On biotin  No Personal history of head/neck surgery/irradiation  No  Grave's Ophthalmopathy Clinical Activity Score: 0/9  Apr 30 FT4 4.12 TSH <0.005  04/16/2013 CLINICAL DATA:  Neck fullness and swelling. History of grave's disease.   EXAM: THYROID  ULTRASOUND   TECHNIQUE: Ultrasound examination of the thyroid gland and adjacent  soft tissues was performed.   COMPARISON:  None Available.   FINDINGS: Parenchymal Echotexture: Moderately heterogenous   Isthmus: 0.9 cm   Right lobe: 5.5 x 2.0 x 2.0 cm   Left lobe: 5.2 x 1.9 x 2.0 cm   _________________________________________________________   Estimated total number of nodules >/= 1 cm: 0   Number of spongiform nodules >/=  2 cm not described below (TR1): 0   Number of mixed cystic and solid nodules >/= 1.5 cm not described below (TR2): 0   _________________________________________________________   No discrete nodules are seen within the thyroid gland. The thyroid parenchyma demonstrates subjectively increased vascularity by color Doppler. No enlarged or abnormal appearing lymph nodes are identified.   IMPRESSION: Mildly enlarged and moderately heterogeneous thyroid gland with subjectively increased vascularity. No discrete thyroid nodules identified.    Physical Exam  BP 115/70   Pulse 80   Ht 5\' 7"  (1.702 m)   Wt 143 lb (64.9 kg)   LMP 02/16/2023   SpO2 99%   BMI 22.40 kg/m  Constitutional: well developed, well nourished Head: normocephalic, atraumatic, no exophthalmos Eyes: sclera anicteric, no redness Neck: + thyromegaly, no thyroid tenderness; no nodules palpated Lungs: normal respiratory effort Neurology: alert and oriented, no fine hand tremor Skin: dry, no appreciable rashes Musculoskeletal: no appreciable defects Psychiatric: normal mood and affect  Allergies Allergies  Allergen Reactions   Amoxicillin Hives, Diarrhea and Nausea And Vomiting    Childhood allergy Has patient had a PCN reaction causing immediate rash, facial/tongue/throat swelling, SOB or lightheadedness with hypotension: Yes Has patient had a PCN reaction causing severe rash involving mucus membranes or skin necrosis: No Has patient had a PCN reaction that required hospitalization: No Has patient had a PCN reaction occurring within the last 10 years: No If  all of the above answers are "NO", then may proceed with Cephalosporin use.   Penicillins Hives, Diarrhea and Nausea And Vomiting    Has patient had a PCN reaction causing immediate rash, facial/tongue/throat swelling, SOB or lightheadedness with hypotension: Yes Has patient had a PCN reaction causing severe rash involving mucus membranes or skin necrosis: No Has patient had a PCN reaction that required hospitalization No Has patient had a PCN reaction occurring within the last 10 years: No If all of the above answers are "NO", then may proceed with Cephalosporin    Current Medications Patient's Medications  New Prescriptions   No medications on file  Previous Medications   DOXYLAMINE-PYRIDOXINE 10-10 MG TBEC    Initial, 2 tablets (doxylamine succinate 10 mg/pyridoxine hydrochloride 10 mg per tablet) orally at bedtime on day 1 and 2; if symptoms persist, take 1 tablet in morning and 2 tablets at bedtime on day 3; if symptoms persist, may increase to MAX 4 tablets per day, administered as 1 tablet in the morning, 1 tablet in mid-afternoon and 2 tablets at bedtime   ONDANSETRON (ZOFRAN-ODT) 4 MG DISINTEGRATING TABLET    Take 1 tablet (4 mg total) by mouth every 8 (eight) hours as needed for nausea or vomiting.   PROPYLTHIOURACIL (PTU) 50 MG TABLET    Take 2 tablets (100 mg total) by mouth 2 (two) times daily.  Modified Medications   No medications on file  Discontinued Medications   No medications on file    Past Medical History Past Medical History:  Diagnosis Date   Anxiety    Asthma    Bacterial vaginitis 05/16/2018  Chlamydia    Cutaneous candidiasis 05/16/2018   Depression    was rx meds, has not picked them up   Eczema    Genital herpes    HA (headache)    Infection    UTI   Migraine 03/10/2014   Vaginal irritation 05/16/2018    Past Surgical History Past Surgical History:  Procedure Laterality Date   NO PAST SURGERIES     None     WISDOM TOOTH EXTRACTION       Family History family history includes Breast cancer in her mother; Cancer in her maternal grandmother.  Social History Social History   Socioeconomic History   Marital status: Single    Spouse name: Not on file   Number of children: 0   Years of education: 12   Highest education level: Not on file  Occupational History    Comment: High school student  Tobacco Use   Smoking status: Never   Smokeless tobacco: Never  Vaping Use   Vaping status: Never Used  Substance and Sexual Activity   Alcohol use: No   Drug use: Not Currently    Frequency: 7.0 times per week    Types: Marijuana    Comment: last used 2 weeks ago   Sexual activity: Yes    Birth control/protection: Implant  Other Topics Concern   Not on file  Social History Narrative   Patient lives at home with her father Justise Ehmann).   Patient is a high Ecologist.   Right handed.   Caffeine soda one daily.   Social Determinants of Health   Financial Resource Strain: Low Risk  (05/02/2023)   Received from [facility], [facility]   Overall Financial Resource Strain (CARDIA)    Difficulty of Paying Living Expenses: Not very hard  Recent Concern: Financial Resource Strain - High Risk (02/28/2023)   Received from [facility]   Overall Financial Resource Strain (CARDIA)    Difficulty of Paying Living Expenses: Very hard  Food Insecurity: Food Insecurity Present (05/02/2023)   Received from [facility], [facility]   Hunger Vital Sign    Worried About Running Out of Food in the Last Year: Sometimes true    Ran Out of Food in the Last Year: Never true  Transportation Needs: Unmet Transportation Needs (05/02/2023)   Received from [facility], [facility]   PRAPARE - Transportation    Lack of Transportation (Medical): Yes    Lack of Transportation (Non-Medical): Yes  Physical Activity: Unknown (05/02/2023)   Received from [facility], [facility]   Exercise Vital Sign    Days of Exercise per  Week: 0 days    Minutes of Exercise per Session: Not on file  Stress: No Stress Concern Present (05/02/2023)   Received from [facility], [facility] of Occupational [facility] - Occupational Stress Questionnaire    Feeling of Stress : Not at all  Social Connections: Socially Integrated (05/02/2023)   Received from [facility], [facility]   Social Network    How would you rate your social network (family, work, friends)?: Good participation with social networks  Intimate Partner Violence: Not At Risk (05/02/2023)   Received from [facility], [facility]   Humiliation, Afraid, Rape, and Kick questionnaire    Fear of Current or Ex-Partner: No    Emotionally Abused: No    Physically Abused: No    Sexually Abused: No    Laboratory Investigations Lab Results  Component Value Date  TSH 0.539 07/28/2023   TSH <0.01 (L) 05/03/2023   TSH <0.010 (L) 04/17/2023   FREET4 1.3 05/03/2023   FREET4 1.35 (H) 04/17/2023   FREET4 1.92 (H) 04/10/2023     Lab Results  Component Value Date   TSI 306 (H) 03/20/2023     No components found for: "TRAB"   No results found for: "CHOL" No results found for: "HDL" No results found for: "LDLCALC" No results found for: "TRIG" No results found for: "CHOLHDL" Lab Results  Component Value Date   CREATININE 0.53 05/21/2023   No results found for: "GFR"    Component Value Date/Time   NA 134 (L) 05/21/2023 1420   K 3.6 05/21/2023 1420   CL 104 05/21/2023 1420   CO2 22 05/21/2023 1420   GLUCOSE 89 05/21/2023 1420   BUN 6 05/21/2023 1420   CREATININE 0.53 05/21/2023 1420   CALCIUM 8.8 (L) 05/21/2023 1420   PROT 6.2 (L) 05/21/2023 1420   ALBUMIN 3.4 (L) 05/21/2023 1420   AST 15 05/21/2023 1420   ALT 11 05/21/2023 1420   ALKPHOS 59 05/21/2023 1420   BILITOT 0.2 (L) 05/21/2023 1420   GFRNONAA >60 05/21/2023 1420   GFRAA >60 06/22/2019 1405      Latest Ref Rng & Units 05/21/2023    2:20 PM 04/17/2023   11:32 AM 06/22/2019     2:05 PM  BMP  Glucose 70 - 99 mg/dL 89  89  81   BUN 6 - 20 mg/dL 6  9  13    Creatinine 0.44 - 1.00 mg/dL 5.78  4.69  6.29   Sodium 135 - 145 mmol/L 134  136  139   Potassium 3.5 - 5.1 mmol/L 3.6  3.7  3.6   Chloride 98 - 111 mmol/L 104  104  106   CO2 22 - 32 mmol/L 22  24  25    Calcium 8.9 - 10.3 mg/dL 8.8  9.5  9.4        Component Value Date/Time   WBC 6.0 05/21/2023 1420   RBC 4.25 05/21/2023 1420   HGB 10.7 (L) 05/21/2023 1420   HCT 33.2 (L) 05/21/2023 1420   PLT 251 05/21/2023 1420   MCV 78.1 (L) 05/21/2023 1420   MCH 25.2 (L) 05/21/2023 1420   MCHC 32.2 05/21/2023 1420   RDW 16.4 (H) 05/21/2023 1420   LYMPHSABS 1.9 04/17/2023 1132   MONOABS 0.6 04/17/2023 1132   EOSABS 0.1 04/17/2023 1132   BASOSABS 0.0 04/17/2023 1132      Parts of this note may have been dictated using voice recognition software. There may be variances in spelling and vocabulary which are unintentional. Not all errors are proofread. Please notify the Thereasa Parkin if any discrepancies are noted or if the meaning of any statement is not clear.

## 2023-08-16 ENCOUNTER — Telehealth: Payer: Self-pay | Admitting: "Endocrinology

## 2023-08-16 NOTE — Telephone Encounter (Signed)
I placed the form in Dr Shellia Cleverly basket waiting for signature

## 2023-08-16 NOTE — Telephone Encounter (Signed)
Patient faxed a surgical clearance form for completion.  Form is in Dr. Grafton Folk folder in the front office.  Patient will come to office to pick up completed form when ready.

## 2023-08-24 ENCOUNTER — Telehealth: Payer: Self-pay

## 2023-08-24 ENCOUNTER — Other Ambulatory Visit: Payer: Self-pay | Admitting: "Endocrinology

## 2023-08-24 NOTE — Telephone Encounter (Signed)
Surgical Clearance Letter was faxed to the [facility] at [phone] and Lakieta is aware

## 2023-09-01 ENCOUNTER — Telehealth: Payer: Self-pay

## 2023-09-01 ENCOUNTER — Ambulatory Visit: Payer: Medicaid Other

## 2023-09-01 NOTE — Telephone Encounter (Signed)
Need to move ultrasound appointment from today due to shortage of ultrasound tech. Requested patient call office back

## 2023-09-04 ENCOUNTER — Ambulatory Visit: Payer: Medicaid Other | Attending: Maternal & Fetal Medicine

## 2023-09-04 ENCOUNTER — Other Ambulatory Visit: Payer: Self-pay | Admitting: *Deleted

## 2023-09-04 DIAGNOSIS — O99282 Endocrine, nutritional and metabolic diseases complicating pregnancy, second trimester: Secondary | ICD-10-CM | POA: Insufficient documentation

## 2023-09-04 DIAGNOSIS — Z148 Genetic carrier of other disease: Secondary | ICD-10-CM | POA: Diagnosis not present

## 2023-09-04 DIAGNOSIS — O99283 Endocrine, nutritional and metabolic diseases complicating pregnancy, third trimester: Secondary | ICD-10-CM | POA: Diagnosis not present

## 2023-09-04 DIAGNOSIS — Z3A28 28 weeks gestation of pregnancy: Secondary | ICD-10-CM

## 2023-09-04 DIAGNOSIS — E0590 Thyrotoxicosis, unspecified without thyrotoxic crisis or storm: Secondary | ICD-10-CM | POA: Insufficient documentation

## 2023-09-19 DIAGNOSIS — Z148 Genetic carrier of other disease: Secondary | ICD-10-CM | POA: Insufficient documentation

## 2023-10-03 ENCOUNTER — Other Ambulatory Visit: Payer: Medicaid Other

## 2023-10-03 ENCOUNTER — Ambulatory Visit: Payer: Medicaid Other | Attending: Obstetrics and Gynecology

## 2023-10-03 ENCOUNTER — Other Ambulatory Visit: Payer: Self-pay

## 2023-10-03 ENCOUNTER — Other Ambulatory Visit: Payer: Self-pay | Admitting: *Deleted

## 2023-10-03 DIAGNOSIS — O285 Abnormal chromosomal and genetic finding on antenatal screening of mother: Secondary | ICD-10-CM | POA: Diagnosis not present

## 2023-10-03 DIAGNOSIS — E0590 Thyrotoxicosis, unspecified without thyrotoxic crisis or storm: Secondary | ICD-10-CM | POA: Diagnosis not present

## 2023-10-03 DIAGNOSIS — O99283 Endocrine, nutritional and metabolic diseases complicating pregnancy, third trimester: Secondary | ICD-10-CM | POA: Insufficient documentation

## 2023-10-03 DIAGNOSIS — Z3A32 32 weeks gestation of pregnancy: Secondary | ICD-10-CM

## 2023-10-03 DIAGNOSIS — Z148 Genetic carrier of other disease: Secondary | ICD-10-CM | POA: Insufficient documentation

## 2023-10-03 DIAGNOSIS — E0500 Thyrotoxicosis with diffuse goiter without thyrotoxic crisis or storm: Secondary | ICD-10-CM

## 2023-10-03 DIAGNOSIS — O2693 Pregnancy related conditions, unspecified, third trimester: Secondary | ICD-10-CM | POA: Diagnosis not present

## 2023-10-03 LAB — OB RESULTS CONSOLE HIV ANTIBODY (ROUTINE TESTING): HIV: NONREACTIVE

## 2023-10-03 LAB — OB RESULTS CONSOLE HEPATITIS B SURFACE ANTIGEN: Hepatitis B Surface Ag: NEGATIVE

## 2023-10-03 LAB — OB RESULTS CONSOLE GC/CHLAMYDIA
Chlamydia: NEGATIVE
Neisseria Gonorrhea: NEGATIVE

## 2023-10-03 LAB — HEPATITIS C ANTIBODY: HCV Ab: NEGATIVE

## 2023-10-03 LAB — OB RESULTS CONSOLE VARICELLA ZOSTER ANTIBODY, IGG: Varicella: IMMUNE

## 2023-10-04 ENCOUNTER — Other Ambulatory Visit: Payer: Medicaid Other

## 2023-10-04 LAB — T3, FREE: T3, Free: 2.7 pg/mL (ref 2.3–4.2)

## 2023-10-05 ENCOUNTER — Ambulatory Visit: Payer: Medicaid Other | Admitting: "Endocrinology

## 2023-10-05 ENCOUNTER — Telehealth: Payer: Self-pay

## 2023-10-05 NOTE — Telephone Encounter (Signed)
Left message with next 3 week appointments.

## 2023-10-09 ENCOUNTER — Encounter: Payer: Self-pay | Admitting: *Deleted

## 2023-10-09 ENCOUNTER — Ambulatory Visit: Payer: Medicaid Other | Attending: Maternal & Fetal Medicine

## 2023-10-09 ENCOUNTER — Ambulatory Visit: Payer: Medicaid Other | Admitting: *Deleted

## 2023-10-09 ENCOUNTER — Other Ambulatory Visit: Payer: Self-pay

## 2023-10-09 DIAGNOSIS — O99283 Endocrine, nutritional and metabolic diseases complicating pregnancy, third trimester: Secondary | ICD-10-CM | POA: Diagnosis present

## 2023-10-09 DIAGNOSIS — O285 Abnormal chromosomal and genetic finding on antenatal screening of mother: Secondary | ICD-10-CM | POA: Diagnosis not present

## 2023-10-09 DIAGNOSIS — E0590 Thyrotoxicosis, unspecified without thyrotoxic crisis or storm: Secondary | ICD-10-CM | POA: Diagnosis not present

## 2023-10-09 DIAGNOSIS — Z148 Genetic carrier of other disease: Secondary | ICD-10-CM

## 2023-10-09 DIAGNOSIS — Z3A33 33 weeks gestation of pregnancy: Secondary | ICD-10-CM

## 2023-10-10 ENCOUNTER — Other Ambulatory Visit: Payer: Self-pay | Admitting: *Deleted

## 2023-10-10 ENCOUNTER — Ambulatory Visit: Payer: Medicaid Other | Admitting: "Endocrinology

## 2023-10-10 DIAGNOSIS — E0590 Thyrotoxicosis, unspecified without thyrotoxic crisis or storm: Secondary | ICD-10-CM

## 2023-10-16 ENCOUNTER — Other Ambulatory Visit: Payer: Self-pay

## 2023-10-16 ENCOUNTER — Ambulatory Visit: Payer: Medicaid Other | Admitting: *Deleted

## 2023-10-16 ENCOUNTER — Encounter: Payer: Self-pay | Admitting: *Deleted

## 2023-10-16 ENCOUNTER — Ambulatory Visit: Payer: Medicaid Other | Attending: Obstetrics and Gynecology

## 2023-10-16 ENCOUNTER — Other Ambulatory Visit: Payer: Self-pay | Admitting: *Deleted

## 2023-10-16 DIAGNOSIS — E0590 Thyrotoxicosis, unspecified without thyrotoxic crisis or storm: Secondary | ICD-10-CM | POA: Diagnosis not present

## 2023-10-16 DIAGNOSIS — O99283 Endocrine, nutritional and metabolic diseases complicating pregnancy, third trimester: Secondary | ICD-10-CM | POA: Insufficient documentation

## 2023-10-16 DIAGNOSIS — Z3A34 34 weeks gestation of pregnancy: Secondary | ICD-10-CM

## 2023-10-23 ENCOUNTER — Ambulatory Visit: Payer: Medicaid Other | Admitting: *Deleted

## 2023-10-23 ENCOUNTER — Encounter: Payer: Self-pay | Admitting: *Deleted

## 2023-10-23 ENCOUNTER — Ambulatory Visit: Payer: Medicaid Other | Attending: Maternal & Fetal Medicine

## 2023-10-23 DIAGNOSIS — O285 Abnormal chromosomal and genetic finding on antenatal screening of mother: Secondary | ICD-10-CM

## 2023-10-23 DIAGNOSIS — O99283 Endocrine, nutritional and metabolic diseases complicating pregnancy, third trimester: Secondary | ICD-10-CM | POA: Diagnosis present

## 2023-10-23 DIAGNOSIS — Z148 Genetic carrier of other disease: Secondary | ICD-10-CM

## 2023-10-23 DIAGNOSIS — Z3A35 35 weeks gestation of pregnancy: Secondary | ICD-10-CM

## 2023-10-23 DIAGNOSIS — E0590 Thyrotoxicosis, unspecified without thyrotoxic crisis or storm: Secondary | ICD-10-CM | POA: Diagnosis present

## 2023-10-24 ENCOUNTER — Inpatient Hospital Stay (HOSPITAL_COMMUNITY)
Admission: AD | Admit: 2023-10-24 | Discharge: 2023-10-24 | Disposition: A | Discharge status: Home / Self Care | Payer: Medicaid Other | Attending: Obstetrics and Gynecology | Admitting: Obstetrics and Gynecology

## 2023-10-24 ENCOUNTER — Encounter (HOSPITAL_COMMUNITY): Payer: Self-pay | Admitting: Obstetrics and Gynecology

## 2023-10-24 DIAGNOSIS — O4703 False labor before 37 completed weeks of gestation, third trimester: Secondary | ICD-10-CM | POA: Diagnosis present

## 2023-10-24 DIAGNOSIS — N898 Other specified noninflammatory disorders of vagina: Secondary | ICD-10-CM | POA: Diagnosis not present

## 2023-10-24 DIAGNOSIS — O26893 Other specified pregnancy related conditions, third trimester: Secondary | ICD-10-CM | POA: Diagnosis not present

## 2023-10-24 DIAGNOSIS — Z3A35 35 weeks gestation of pregnancy: Secondary | ICD-10-CM | POA: Diagnosis not present

## 2023-10-24 LAB — URINALYSIS, ROUTINE W REFLEX MICROSCOPIC
Bilirubin Urine: NEGATIVE
Glucose, UA: NEGATIVE mg/dL
Hgb urine dipstick: NEGATIVE
Ketones, ur: 20 mg/dL — AB
Nitrite: NEGATIVE
Protein, ur: NEGATIVE mg/dL
Specific Gravity, Urine: 1.018 (ref 1.005–1.030)
pH: 7 (ref 5.0–8.0)

## 2023-10-24 LAB — RUPTURE OF MEMBRANE (ROM)PLUS: Rom Plus: NEGATIVE

## 2023-10-24 MED ORDER — NIFEDIPINE 10 MG PO CAPS
10.0000 mg | ORAL_CAPSULE | ORAL | Status: DC | PRN
  Administered 2023-10-24 (×2): 10 mg via ORAL
  Filled 2023-10-24 (×3): qty 1

## 2023-10-24 NOTE — MAU Provider Note (Signed)
Chief Complaint:  Contractions   Event Date/Time   First Provider Initiated Contact with Patient 10/24/23 0225     HPI: Rebekah Rhodes is a 27 y.o. G2P0010 at 18w5dwho presents to maternity admissions reporting painful contractions with vaginal discharge that feels watery  Pain just started tonight. . She reports good fetal movement, denies vaginal bleeding, vaginal itching/burning, urinary symptoms, n/v, diarrhea, constipation or fever/chills.    Abdominal Pain This is a new problem. The current episode started today. The problem occurs intermittently. The problem has been unchanged. The quality of the pain is cramping. The abdominal pain does not radiate. Pertinent negatives include no constipation, diarrhea or dysuria. Nothing aggravates the pain. The pain is relieved by Nothing. She has tried nothing for the symptoms.   RN Note: Rebekah Rhodes is a 27 y.o. at [redacted]w[redacted]d here in MAU reporting: contractions that began in the last 30 minutes that feel like they are every 1-2 minutes. Is having "watery discharge" but has not had a gush of fluid. Denies vaginal bleeding. +FM   Past Medical History: Past Medical History:  Diagnosis Date   Anxiety    Asthma    Bacterial vaginitis 05/16/2018   Chlamydia    Cutaneous candidiasis 05/16/2018   Depression    was rx meds, has not picked them up   Eczema    Genital herpes    HA (headache)    Infection    UTI   Migraine 03/10/2014   Vaginal irritation 05/16/2018    Past obstetric history: OB History  Gravida Para Term Preterm AB Living  2 0 0 0 1 0  SAB IAB Ectopic Multiple Live Births  0 1 0 0     # Outcome Date GA Lbr Len/2nd Weight Sex Type Anes PTL Lv  2 Current           1 IAB             Past Surgical History: Past Surgical History:  Procedure Laterality Date   NO PAST SURGERIES     None     WISDOM TOOTH EXTRACTION      Family History: Family History  Problem Relation Age of Onset   Breast cancer Mother    Cancer  Maternal Grandmother        breast; also great grandma    Social History: Social History   Tobacco Use   Smoking status: Never   Smokeless tobacco: Never  Vaping Use   Vaping status: Never Used  Substance Use Topics   Alcohol use: No   Drug use: Not Currently    Frequency: 7.0 times per week    Types: Marijuana    Comment: last used 2 weeks ago    Allergies:  Allergies  Allergen Reactions   Amoxicillin Hives, Diarrhea and Nausea And Vomiting    Childhood allergy Has patient had a PCN reaction causing immediate rash, facial/tongue/throat swelling, SOB or lightheadedness with hypotension: Yes Has patient had a PCN reaction causing severe rash involving mucus membranes or skin necrosis: No Has patient had a PCN reaction that required hospitalization: No Has patient had a PCN reaction occurring within the last 10 years: No If all of the above answers are "NO", then may proceed with Cephalosporin use.   Penicillins Hives, Diarrhea and Nausea And Vomiting    Has patient had a PCN reaction causing immediate rash, facial/tongue/throat swelling, SOB or lightheadedness with hypotension: Yes Has patient had a PCN reaction causing severe rash involving mucus membranes  or skin necrosis: No Has patient had a PCN reaction that required hospitalization No Has patient had a PCN reaction occurring within the last 10 years: No If all of the above answers are "NO", then may proceed with Cephalosporin    Meds:  Medications Prior to Admission  Medication Sig Dispense Refill Last Dose/Taking   Doxylamine-Pyridoxine 10-10 MG TBEC Initial, 2 tablets (doxylamine succinate 10 mg/pyridoxine hydrochloride 10 mg per tablet) orally at bedtime on day 1 and 2; if symptoms persist, take 1 tablet in morning and 2 tablets at bedtime on day 3; if symptoms persist, may increase to MAX 4 tablets per day, administered as 1 tablet in the morning, 1 tablet in mid-afternoon and 2 tablets at bedtime (Patient not  taking: Reported on 10/16/2023) 60 tablet 1    ferrous sulfate 325 (65 FE) MG EC tablet Take 325 mg by mouth 3 (three) times daily with meals.      ondansetron (ZOFRAN-ODT) 4 MG disintegrating tablet Take 1 tablet (4 mg total) by mouth every 8 (eight) hours as needed for nausea or vomiting. (Patient not taking: Reported on 10/16/2023) 20 tablet 0    propylthiouracil (PTU) 50 MG tablet Take 2 tablets (100 mg total) by mouth 2 (two) times daily. (Patient not taking: Reported on 10/16/2023) 120 tablet 2     I have reviewed patient's Past Medical Hx, Surgical Hx, Family Hx, Social Hx, medications and allergies.   ROS:  Review of Systems  Gastrointestinal:  Positive for abdominal pain. Negative for constipation and diarrhea.  Genitourinary:  Negative for dysuria.   Other systems negative  Physical Exam  Patient Vitals for the past 24 hrs:  BP Temp Temp src Pulse Resp SpO2 Height Weight  10/24/23 0215 -- -- -- -- -- 100 % -- --  10/24/23 0214 126/87 98.7 F (37.1 C) Oral 77 19 -- 5\' 7"  (1.702 m) 71.7 kg   Constitutional: Well-developed, well-nourished female in no acute distress.  Cardiovascular: normal rate Respiratory: normal effort GI: Abd soft, non-tender, gravid appropriate for gestational age.   No rebound or guarding. MS: Extremities nontender, no edema, normal ROM Neurologic: Alert and oriented x 4.  GU: Neg CVAT.  PELVIC EXAM: Dilation: 1 Effacement (%): 80 Station: -1 Presentation: Vertex Exam by:: Sheilah Mins CNM   FHT:  Baseline 140 , moderate variability, accelerations present, no decelerations Contractions: q 3 mins Irregular     Labs: Results for orders placed or performed during the hospital encounter of 10/24/23 (from the past 24 hours)  Urinalysis, Routine w reflex microscopic -Urine, Clean Catch     Status: Abnormal   Collection Time: 10/24/23  2:21 AM  Result Value Ref Range   Color, Urine YELLOW YELLOW   APPearance HAZY (A) CLEAR   Specific Gravity, Urine  1.018 1.005 - 1.030   pH 7.0 5.0 - 8.0   Glucose, UA NEGATIVE NEGATIVE mg/dL   Hgb urine dipstick NEGATIVE NEGATIVE   Bilirubin Urine NEGATIVE NEGATIVE   Ketones, ur 20 (A) NEGATIVE mg/dL   Protein, ur NEGATIVE NEGATIVE mg/dL   Nitrite NEGATIVE NEGATIVE   Leukocytes,Ua TRACE (A) NEGATIVE   RBC / HPF 0-5 0 - 5 RBC/hpf   WBC, UA 0-5 0 - 5 WBC/hpf   Bacteria, UA RARE (A) NONE SEEN   Squamous Epithelial / HPF 0-5 0 - 5 /HPF  Rupture of Membrane (ROM) Plus     Status: None   Collection Time: 10/24/23  3:10 AM  Result Value Ref Range   Rom  Plus NEGATIVE        Imaging:    MAU Course/MDM: I have reviewed the triage vital signs and the nursing notes.   Pertinent labs & imaging results that were available during my care of the patient were reviewed by me and considered in my medical decision making (see chart for details).      I have reviewed her medical records including past results, notes and treatments.   I have ordered labs and reviewed results. ROM+ was negative NST reviewed   Treatments in MAU included Procardia series x 2 doses  Contractions subsided with this treatment. Patient states contractions don't hurt anymore.    Assessment: Single IUP at [redacted]w[redacted]d Preterm labor  Vaginal discharge  Plan: Discharge home Preterm Labor precautions and fetal kick counts Follow up in Office for prenatal visits and recheck Encouraged to return if she develops worsening of symptoms, increase in pain, fever, or other concerning symptoms.    Pt stable at time of discharge.  Wynelle Bourgeois CNM, MSN Certified Nurse-Midwife 10/24/2023 2:25 AM

## 2023-10-24 NOTE — MAU Note (Signed)
..  Rebekah Rhodes is a 27 y.o. at [redacted]w[redacted]d here in MAU reporting: contractions that began in the last 30 minutes that feel like they are every 1-2 minutes. Is having "watery discharge" but has not had a gush of fluid. Denies vaginal bleeding. +FM   Pain score: 7/10 Vitals:   10/24/23 0214 10/24/23 0215  BP: 126/87   Pulse: 77   Resp: 19   Temp: 98.7 F (37.1 C)   SpO2:  100%     WUJ:[id number] in room  Lab orders placed from triage:  UA

## 2023-10-30 ENCOUNTER — Ambulatory Visit: Payer: Medicaid Other | Attending: Obstetrics and Gynecology | Admitting: *Deleted

## 2023-10-30 DIAGNOSIS — E0590 Thyrotoxicosis, unspecified without thyrotoxic crisis or storm: Secondary | ICD-10-CM | POA: Diagnosis not present

## 2023-10-30 DIAGNOSIS — Z3A36 36 weeks gestation of pregnancy: Secondary | ICD-10-CM

## 2023-10-30 DIAGNOSIS — O99283 Endocrine, nutritional and metabolic diseases complicating pregnancy, third trimester: Secondary | ICD-10-CM

## 2023-10-30 NOTE — Procedures (Signed)
Rebekah Rhodes 16-Aug-1996 [redacted]w[redacted]d  Fetus A Non-Stress Test Interpretation for 10/30/23  NST only  Indication:  Hyperthyroidism  Fetal Heart Rate A Mode: External Baseline Rate (A): 145 bpm Variability: Moderate Accelerations: 15 x 15 Decelerations: None Multiple birth?: No  Uterine Activity Mode: Palpation, Toco Contraction Frequency (min): none Resting Tone Palpated: Relaxed  Interpretation (Fetal Testing) Nonstress Test Interpretation: Reactive Overall Impression: Reassuring for gestational age Comments: Dr. Parke Poisson reviewed tracing

## 2023-11-01 NOTE — L&D Delivery Note (Signed)
Delivery Note At 10:33 PM a viable female was delivered via Vaginal, Spontaneous (Presentation: Left Occiput Anterior).  APGAR: , ; weight  .   Placenta status: Pathology;Spontaneous, Intact.  Cord: 3 vessels with the following complications: None.  Cord pH: not sent Head delivered to perineum.  Body delivered without difficulty and placed skin to skin with mom.  Good fetal heart rate.  Cord clamped once pulsationstopped.  Placenta sent to pathology  Anesthesia: Epidural Episiotomy: None Lacerations: 1st degree;Perineal Suture Repair: 2.0 chromic Est. Blood Loss (mL): 200  Mom to postpartum.  Baby to Couplet care / Skin to Skin.  Rebekah Rhodes 11/16/2023, 11:30 PM

## 2023-11-07 ENCOUNTER — Ambulatory Visit: Payer: Medicaid Other

## 2023-11-07 ENCOUNTER — Telehealth: Payer: Self-pay

## 2023-11-07 NOTE — Telephone Encounter (Signed)
 Left message of ultrasound appointment reschedule to Wednesday 11/08/23 - arrive at 3:15pm.

## 2023-11-08 ENCOUNTER — Other Ambulatory Visit: Payer: Self-pay | Admitting: *Deleted

## 2023-11-08 ENCOUNTER — Ambulatory Visit: Payer: Medicaid Other | Attending: Obstetrics

## 2023-11-08 DIAGNOSIS — Z148 Genetic carrier of other disease: Secondary | ICD-10-CM

## 2023-11-08 DIAGNOSIS — O285 Abnormal chromosomal and genetic finding on antenatal screening of mother: Secondary | ICD-10-CM

## 2023-11-08 DIAGNOSIS — O99283 Endocrine, nutritional and metabolic diseases complicating pregnancy, third trimester: Secondary | ICD-10-CM | POA: Diagnosis present

## 2023-11-08 DIAGNOSIS — E0590 Thyrotoxicosis, unspecified without thyrotoxic crisis or storm: Secondary | ICD-10-CM | POA: Diagnosis not present

## 2023-11-08 DIAGNOSIS — Z3A37 37 weeks gestation of pregnancy: Secondary | ICD-10-CM

## 2023-11-15 ENCOUNTER — Other Ambulatory Visit: Payer: Self-pay

## 2023-11-15 ENCOUNTER — Ambulatory Visit: Payer: Medicaid Other | Attending: Obstetrics | Admitting: *Deleted

## 2023-11-15 VITALS — BP 119/67

## 2023-11-15 DIAGNOSIS — Z3A38 38 weeks gestation of pregnancy: Secondary | ICD-10-CM | POA: Insufficient documentation

## 2023-11-15 DIAGNOSIS — E0590 Thyrotoxicosis, unspecified without thyrotoxic crisis or storm: Secondary | ICD-10-CM | POA: Diagnosis not present

## 2023-11-15 DIAGNOSIS — O26893 Other specified pregnancy related conditions, third trimester: Secondary | ICD-10-CM | POA: Diagnosis not present

## 2023-11-15 DIAGNOSIS — O99283 Endocrine, nutritional and metabolic diseases complicating pregnancy, third trimester: Secondary | ICD-10-CM

## 2023-11-15 NOTE — Procedures (Signed)
 Rebekah Rhodes [birth year] [redacted]w[redacted]d  Fetus A Non-Stress Test Interpretation for 11/15/23  Indication:  Hyperthyroidism  Fetal Heart Rate A Mode: External Baseline Rate (A): 135 bpm Variability: Moderate Accelerations: 15 x 15 Decelerations: None Multiple birth?: No  Uterine Activity Mode: Palpation, Toco Contraction Frequency (min): 1 uc Contraction Duration (sec): 80 Contraction Quality: Mild Resting Tone Palpated: Relaxed Resting Time: Adequate  Interpretation (Fetal Testing) Nonstress Test Interpretation: Reactive Overall Impression: Reassuring for gestational age Comments: Dr. Arnie Bibber reviewed tracing

## 2023-11-16 ENCOUNTER — Inpatient Hospital Stay (HOSPITAL_COMMUNITY): Payer: Medicaid Other

## 2023-11-16 ENCOUNTER — Inpatient Hospital Stay (HOSPITAL_COMMUNITY)
Admission: AD | Admit: 2023-11-16 | Discharge: 2023-11-18 | DRG: 806 | Disposition: A | Discharge status: Home / Self Care | Payer: Medicaid Other | Attending: Obstetrics and Gynecology | Admitting: Obstetrics and Gynecology

## 2023-11-16 ENCOUNTER — Encounter (HOSPITAL_COMMUNITY): Payer: Self-pay | Admitting: Obstetrics and Gynecology

## 2023-11-16 DIAGNOSIS — Z3A38 38 weeks gestation of pregnancy: Secondary | ICD-10-CM

## 2023-11-16 DIAGNOSIS — O134 Gestational [pregnancy-induced] hypertension without significant proteinuria, complicating childbirth: Secondary | ICD-10-CM | POA: Diagnosis present

## 2023-11-16 DIAGNOSIS — O9902 Anemia complicating childbirth: Secondary | ICD-10-CM | POA: Diagnosis present

## 2023-11-16 DIAGNOSIS — O99324 Drug use complicating childbirth: Secondary | ICD-10-CM | POA: Diagnosis present

## 2023-11-16 DIAGNOSIS — A6000 Herpesviral infection of urogenital system, unspecified: Secondary | ICD-10-CM | POA: Diagnosis present

## 2023-11-16 DIAGNOSIS — F1290 Cannabis use, unspecified, uncomplicated: Secondary | ICD-10-CM | POA: Diagnosis present

## 2023-11-16 DIAGNOSIS — D509 Iron deficiency anemia, unspecified: Secondary | ICD-10-CM | POA: Diagnosis present

## 2023-11-16 DIAGNOSIS — Z880 Allergy status to penicillin: Secondary | ICD-10-CM | POA: Diagnosis not present

## 2023-11-16 DIAGNOSIS — O26893 Other specified pregnancy related conditions, third trimester: Secondary | ICD-10-CM | POA: Diagnosis present

## 2023-11-16 DIAGNOSIS — O9832 Other infections with a predominantly sexual mode of transmission complicating childbirth: Secondary | ICD-10-CM | POA: Diagnosis present

## 2023-11-16 DIAGNOSIS — O133 Gestational [pregnancy-induced] hypertension without significant proteinuria, third trimester: Secondary | ICD-10-CM | POA: Diagnosis present

## 2023-11-16 HISTORY — DX: Thyrotoxicosis with diffuse goiter without thyrotoxic crisis or storm: E05.00

## 2023-11-16 LAB — CBC
HCT: 33.8 % — ABNORMAL LOW (ref 36.0–46.0)
Hemoglobin: 10.9 g/dL — ABNORMAL LOW (ref 12.0–15.0)
MCH: 26.9 pg (ref 26.0–34.0)
MCHC: 32.2 g/dL (ref 30.0–36.0)
MCV: 83.5 fL (ref 80.0–100.0)
Platelets: 239 10*3/uL (ref 150–400)
RBC: 4.05 MIL/uL (ref 3.87–5.11)
RDW: 13.6 % (ref 11.5–15.5)
WBC: 6.2 10*3/uL (ref 4.0–10.5)
nRBC: 0 % (ref 0.0–0.2)

## 2023-11-16 LAB — TSH: TSH: 2.635 u[IU]/mL (ref 0.350–4.500)

## 2023-11-16 LAB — COMPREHENSIVE METABOLIC PANEL
ALT: 11 U/L (ref 0–44)
AST: 18 U/L (ref 15–41)
Albumin: 3 g/dL — ABNORMAL LOW (ref 3.5–5.0)
Alkaline Phosphatase: 268 U/L — ABNORMAL HIGH (ref 38–126)
Anion gap: 9 (ref 5–15)
BUN: 7 mg/dL (ref 6–20)
CO2: 21 mmol/L — ABNORMAL LOW (ref 22–32)
Calcium: 8.8 mg/dL — ABNORMAL LOW (ref 8.9–10.3)
Chloride: 107 mmol/L (ref 98–111)
Creatinine, Ser: 0.64 mg/dL (ref 0.44–1.00)
GFR, Estimated: >60 mL/min (ref >60)
Glucose, Bld: 76 mg/dL (ref 70–99)
Potassium: 3.4 mmol/L — ABNORMAL LOW (ref 3.5–5.1)
Sodium: 137 mmol/L (ref 135–145)
Total Bilirubin: 0.6 mg/dL (ref 0.0–1.2)
Total Protein: 6.5 g/dL (ref 6.5–8.1)

## 2023-11-16 LAB — TYPE AND SCREEN
ABO/RH(D): O POS
Antibody Screen: NEGATIVE

## 2023-11-16 LAB — PROTEIN / CREATININE RATIO, URINE
Creatinine, Urine: 90 mg/dL
Protein Creatinine Ratio: 0.09 mg/mg{creat} (ref 0.00–0.15)
Total Protein, Urine: 8 mg/dL

## 2023-11-16 LAB — OB RESULTS CONSOLE GBS: GBS: NEGATIVE

## 2023-11-16 LAB — RPR: RPR Ser Ql: NONREACTIVE

## 2023-11-16 MED ORDER — OXYCODONE-ACETAMINOPHEN 5-325 MG PO TABS: 2.0000 | ORAL_TABLET | ORAL | Status: DC | PRN

## 2023-11-16 MED ORDER — LACTATED RINGERS IV SOLN: 500.0000 mL | Freq: Once | INTRAVENOUS | Status: DC

## 2023-11-16 MED ORDER — ALBUTEROL SULFATE (2.5 MG/3ML) 0.083% IN NEBU: 2.5000 mg | INHALATION_SOLUTION | RESPIRATORY_TRACT | Status: DC

## 2023-11-16 MED ORDER — FENTANYL CITRATE (PF) 100 MCG/2ML IJ SOLN: 100.0000 ug | INTRAMUSCULAR | Status: DC | PRN

## 2023-11-16 MED ORDER — ONDANSETRON HCL 4 MG/2ML IJ SOLN
4.0000 mg | Freq: Four times a day (QID) | INTRAMUSCULAR | Status: DC | PRN
  Administered 2023-11-16 (×2): 4 mg via INTRAVENOUS
  Filled 2023-11-16 (×2): qty 2

## 2023-11-16 MED ORDER — FENTANYL-BUPIVACAINE-NACL 0.5-0.125-0.9 MG/250ML-% EP SOLN
12.0000 mL/h | EPIDURAL | Status: DC | PRN
  Administered 2023-11-16: 12 mL/h via EPIDURAL
  Filled 2023-11-16: qty 250

## 2023-11-16 MED ORDER — DIPHENHYDRAMINE HCL 50 MG/ML IJ SOLN: 12.5000 mg | INTRAMUSCULAR | Status: DC | PRN

## 2023-11-16 MED ORDER — PHENYLEPHRINE 80 MCG/ML (10ML) SYRINGE FOR IV PUSH (FOR BLOOD PRESSURE SUPPORT): 80.0000 ug | PREFILLED_SYRINGE | INTRAVENOUS | Status: DC | PRN

## 2023-11-16 MED ORDER — LACTATED RINGERS IV SOLN: INTRAVENOUS | Status: DC

## 2023-11-16 MED ORDER — FENTANYL CITRATE (PF) 100 MCG/2ML IJ SOLN
50.0000 ug | INTRAMUSCULAR | Status: DC | PRN
  Administered 2023-11-16: 50 ug via INTRAVENOUS
  Filled 2023-11-16: qty 2

## 2023-11-16 MED ORDER — OXYTOCIN BOLUS FROM INFUSION
333.0000 mL | Freq: Once | INTRAVENOUS | Status: AC
  Administered 2023-11-16: 333 mL via INTRAVENOUS

## 2023-11-16 MED ORDER — OXYTOCIN-SODIUM CHLORIDE 30-0.9 UT/500ML-% IV SOLN
2.5000 [IU]/h | INTRAVENOUS | Status: DC
  Administered 2023-11-16: 2.5 [IU]/h via INTRAVENOUS
  Filled 2023-11-16: qty 500

## 2023-11-16 MED ORDER — LIDOCAINE HCL (PF) 1 % IJ SOLN
INTRAMUSCULAR | Status: DC | PRN
  Administered 2023-11-16 (×2): 5 mL via EPIDURAL

## 2023-11-16 MED ORDER — EPHEDRINE 5 MG/ML INJ: 10.0000 mg | INTRAVENOUS | Status: DC | PRN

## 2023-11-16 MED ORDER — LACTATED RINGERS IV SOLN: 500.0000 mL | INTRAVENOUS | Status: DC | PRN

## 2023-11-16 MED ORDER — SOD CITRATE-CITRIC ACID 500-334 MG/5ML PO SOLN: 30.0000 mL | ORAL | Status: DC | PRN

## 2023-11-16 MED ORDER — LIDOCAINE HCL (PF) 1 % IJ SOLN: 30.0000 mL | INTRAMUSCULAR | Status: DC | PRN

## 2023-11-16 MED ORDER — ACETAMINOPHEN 325 MG PO TABS: 650.0000 mg | ORAL_TABLET | ORAL | Status: DC | PRN

## 2023-11-16 MED ORDER — OXYCODONE-ACETAMINOPHEN 5-325 MG PO TABS: 1.0000 | ORAL_TABLET | ORAL | Status: DC | PRN

## 2023-11-16 NOTE — H&P (Signed)
Rebekah Rhodes is a 28 y.o. female presenting for labor at term.  Contractions increasing in intensity and frequency. OB History     Gravida  2   Para  0   Term  0   Preterm  0   AB  1   Living  0      SAB  0   IAB  1   Ectopic  0   Multiple  0   Live Births             Past Medical History:  Diagnosis Date   Anxiety    Asthma    Bacterial vaginitis 05/16/2018   Chlamydia    Cutaneous candidiasis 05/16/2018   Depression    was rx meds, has not picked them up   Eczema    Genital herpes    Last OB 3-4y ago no S/Sx today 11/16/23   Graves disease    Not taking meds   HA (headache)    Infection    UTI   Migraine 03/10/2014   Vaginal irritation 05/16/2018   Past Surgical History:  Procedure Laterality Date   NO PAST SURGERIES     None     WISDOM TOOTH EXTRACTION     Family History: family history includes Breast cancer in her mother; Cancer in her maternal grandmother. Social History:  reports that she has never smoked. She has never used smokeless tobacco. She reports that she does not currently use drugs after having used the following drugs: Marijuana. Frequency: 7.00 times per week. She reports that she does not drink alcohol.     Maternal Diabetes: No Genetic Screening: Normal horizon sig for Alpha Thal and SMA. FOB not testied.  Maternal Ultrasounds/Referrals: Normal Fetal Ultrasounds or other Referrals:  None Maternal Substance Abuse:  No Significant Maternal Medications:  Meds include: Other:  PTU which she stopped during pregnancy.  Significant Maternal Lab Results:  Group B Strep negative Number of Prenatal Visits:greater than 3 verified prenatal visits Maternal Vaccinations:Covid Other Comments:  None  Review of Systems History Dilation: 5 Effacement (%): 100 Station: -1 Exam by:: Leshawn Houseworth Blood pressure 99/61, pulse 68, temperature 98.1 F (36.7 C), temperature source Oral, resp. rate 18, height 5\' 7"  (1.702 m), weight 75.1 kg, last  menstrual period 02/16/2023, SpO2 100%, unknown if currently breastfeeding. Exam Physical Exam  Physical Examination: General appearance - alert, well appearing, and in no distress Chest - clear to auscultation, no wheezes, rales or rhonchi, symmetric air entry Heart - normal rate and regular rhythm Abdomen - soft, nontender, nondistended, no masses or organomegaly gravid Extremities - Homan's sign negative bilaterally  Prenatal labs: ABO, Rh: --/--/O POS (01/16 1026) Antibody: NEG (01/16 1026) Rubella:   RPR: NON REACTIVE (01/16 1024)  HBsAg:    HIV:    GBS: Negative/-- (01/16 0000)   Assessment/Plan: Term active labor GBS neg Cat 1 Pt comfortable with epidural H/o  graves disease.  Stopped PTU during pregnancy.   Anticipate SVD  EFW at 38 weeks.  7-8# 58% Onesimo Lingard A Vitaliy Eisenhour 11/16/2023, 5:17 PM

## 2023-11-16 NOTE — Anesthesia Preprocedure Evaluation (Signed)
Anesthesia Evaluation  Patient identified by MRN, date of birth, ID band Patient awake    Reviewed: Allergy & Precautions, H&P , NPO status , Patient's Chart, lab work & pertinent test results  Airway Mallampati: II  TM Distance: >3 FB Neck ROM: Full    Dental no notable dental hx.    Pulmonary asthma    Pulmonary exam normal breath sounds clear to auscultation       Cardiovascular negative cardio ROS Normal cardiovascular exam Rhythm:Regular Rate:Normal     Neuro/Psych  Headaches PSYCHIATRIC DISORDERS Anxiety Depression       GI/Hepatic negative GI ROS, Neg liver ROS,,,  Endo/Other   Hyperthyroidism Graves  Renal/GU negative Renal ROS  negative genitourinary   Musculoskeletal negative musculoskeletal ROS (+)    Abdominal   Peds negative pediatric ROS (+)  Hematology negative hematology ROS (+)   Anesthesia Other Findings   Reproductive/Obstetrics (+) Pregnancy                              Anesthesia Physical Anesthesia Plan  ASA: 3  Anesthesia Plan: Epidural   Post-op Pain Management:    Induction:   PONV Risk Score and Plan: Treatment may vary due to age or medical condition  Airway Management Planned: Natural Airway  Additional Equipment:   Intra-op Plan:   Post-operative Plan:   Informed Consent: I have reviewed the patients History and Physical, chart, labs and discussed the procedure including the risks, benefits and alternatives for the proposed anesthesia with the patient or authorized representative who has indicated his/her understanding and acceptance.       Plan Discussed with: Anesthesiologist  Anesthesia Plan Comments: (Patient identified. Risks, benefits, options discussed with patient including but not limited to bleeding, infection, nerve damage, paralysis, failed block, incomplete pain control, headache, blood pressure changes, nausea, vomiting,  reactions to medication, itching, and post partum back pain. Confirmed with bedside nurse the patient's most recent platelet count. Confirmed with the patient that they are not taking any anticoagulation, have any bleeding history or any family history of bleeding disorders. Patient expressed understanding and wishes to proceed. All questions were answered. )         Anesthesia Quick Evaluation

## 2023-11-16 NOTE — Anesthesia Procedure Notes (Signed)
Epidural Patient location during procedure: OB Start time: 11/16/2023 1:30 PM End time: 11/16/2023 1:50 PM  Staffing Anesthesiologist:  Nation, MD Performed: anesthesiologist   Preanesthetic Checklist Completed: patient identified, IV checked, risks and benefits discussed, monitors and equipment checked, pre-op evaluation and timeout performed  Epidural Patient position: sitting Prep: DuraPrep Patient monitoring: heart rate, cardiac monitor, continuous pulse ox and blood pressure Approach: midline Location: L3-L4 Injection technique: LOR air  Needle:  Needle type: Tuohy  Needle gauge: 17 G Needle length: 9 cm Needle insertion depth: 5 cm Catheter type: closed end flexible Catheter size: 19 Gauge Catheter at skin depth: 10 cm Test dose: negative  Assessment Sensory level: T8  Additional Notes Patient identified. Risks/Benefits/Options discussed with patient including but not limited to bleeding, infection, nerve damage, paralysis, failed block, incomplete pain control, headache, blood pressure changes, nausea, vomiting, reactions to medication both or allergic, itching and postpartum back pain. Confirmed with bedside nurse the patient's most recent platelet count. Confirmed with patient that they are not currently taking any anticoagulation, have any bleeding history or any family history of bleeding disorders. Patient expressed understanding and wished to proceed. All questions were answered. Sterile technique was used throughout the entire procedure. Please see nursing notes for vital signs. Test dose was given through epidural catheter and negative prior to continuing to dose epidural or start infusion. Warning signs of high block given to the patient including shortness of breath, tingling/numbness in hands, complete motor block, or any concerning symptoms with instructions to call for help. Patient was given instructions on fall risk and not to get out of bed. All questions  and concerns addressed with instructions to call with any issues or inadequate analgesia.  Reason for block:procedure for pain

## 2023-11-16 NOTE — MAU Note (Signed)
.  Rebekah Rhodes is a 28 y.o. at [redacted]w[redacted]d here in MAU reporting: ctx started at 6am  now about q 2 min apart. Repots some leaking fluid. Good fetal movement felt  LMP:  Onset of complaint: 6am Pain score: 10 Vitals:   11/16/23 0931  BP: (!) 139/91  Pulse: 91  Resp: 18  Temp: 98.1 F (36.7 C)     FHT:131 Lab orders placed from triage: labor eval

## 2023-11-17 LAB — CBC
HCT: 26.6 % — ABNORMAL LOW (ref 36.0–46.0)
HCT: 30.4 % — ABNORMAL LOW (ref 36.0–46.0)
Hemoglobin: 10.1 g/dL — ABNORMAL LOW (ref 12.0–15.0)
Hemoglobin: 8.8 g/dL — ABNORMAL LOW (ref 12.0–15.0)
MCH: 27.1 pg (ref 26.0–34.0)
MCH: 27.1 pg (ref 26.0–34.0)
MCHC: 33.1 g/dL (ref 30.0–36.0)
MCHC: 33.2 g/dL (ref 30.0–36.0)
MCV: 81.5 fL (ref 80.0–100.0)
MCV: 81.8 fL (ref 80.0–100.0)
Platelets: 177 10*3/uL (ref 150–400)
Platelets: 220 10*3/uL (ref 150–400)
RBC: 3.25 MIL/uL — ABNORMAL LOW (ref 3.87–5.11)
RBC: 3.73 MIL/uL — ABNORMAL LOW (ref 3.87–5.11)
RDW: 13.7 % (ref 11.5–15.5)
RDW: 13.7 % (ref 11.5–15.5)
WBC: 10.3 10*3/uL (ref 4.0–10.5)
WBC: 10.5 10*3/uL (ref 4.0–10.5)
nRBC: 0 % (ref 0.0–0.2)
nRBC: 0 % (ref 0.0–0.2)

## 2023-11-17 LAB — T4: T4, Total: 10.6 ug/dL (ref 4.5–12.0)

## 2023-11-17 MED ORDER — DIBUCAINE (PERIANAL) 1 % EX OINT
1.0000 | TOPICAL_OINTMENT | CUTANEOUS | Status: DC | PRN
  Administered 2023-11-18: 1 via RECTAL
  Filled 2023-11-17: qty 28

## 2023-11-17 MED ORDER — ACETAMINOPHEN 325 MG PO TABS
650.0000 mg | ORAL_TABLET | ORAL | Status: DC | PRN
  Administered 2023-11-17 – 2023-11-18 (×3): 650 mg via ORAL
  Filled 2023-11-17 (×3): qty 2

## 2023-11-17 MED ORDER — COCONUT OIL OIL: 1.0000 | TOPICAL_OIL | Status: DC | PRN

## 2023-11-17 MED ORDER — SIMETHICONE 80 MG PO CHEW: 80.0000 mg | CHEWABLE_TABLET | ORAL | Status: DC | PRN

## 2023-11-17 MED ORDER — WITCH HAZEL-GLYCERIN EX PADS
1.0000 | MEDICATED_PAD | CUTANEOUS | Status: DC | PRN
  Administered 2023-11-18: 1 via TOPICAL

## 2023-11-17 MED ORDER — ONDANSETRON HCL 4 MG/2ML IJ SOLN: 4.0000 mg | INTRAMUSCULAR | Status: DC | PRN

## 2023-11-17 MED ORDER — ALBUTEROL SULFATE (2.5 MG/3ML) 0.083% IN NEBU: 2.5000 mg | INHALATION_SOLUTION | Freq: Four times a day (QID) | RESPIRATORY_TRACT | Status: DC | PRN

## 2023-11-17 MED ORDER — ALBUTEROL SULFATE HFA 108 (90 BASE) MCG/ACT IN AERS: 1.0000 | INHALATION_SPRAY | Freq: Four times a day (QID) | RESPIRATORY_TRACT | Status: DC | PRN

## 2023-11-17 MED ORDER — IBUPROFEN 600 MG PO TABS
600.0000 mg | ORAL_TABLET | Freq: Four times a day (QID) | ORAL | Status: DC
  Administered 2023-11-17 – 2023-11-18 (×6): 600 mg via ORAL
  Filled 2023-11-17 (×6): qty 1

## 2023-11-17 MED ORDER — ZOLPIDEM TARTRATE 5 MG PO TABS: 5.0000 mg | ORAL_TABLET | Freq: Every evening | ORAL | Status: DC | PRN

## 2023-11-17 MED ORDER — TETANUS-DIPHTH-ACELL PERTUSSIS 5-2.5-18.5 LF-MCG/0.5 IM SUSY
0.5000 mL | PREFILLED_SYRINGE | Freq: Once | INTRAMUSCULAR | Status: DC
  Filled 2023-11-17: qty 0.5

## 2023-11-17 MED ORDER — ONDANSETRON HCL 4 MG PO TABS: 4.0000 mg | ORAL_TABLET | ORAL | Status: DC | PRN

## 2023-11-17 MED ORDER — DIPHENHYDRAMINE HCL 25 MG PO CAPS
25.0000 mg | ORAL_CAPSULE | Freq: Four times a day (QID) | ORAL | Status: DC | PRN
Start: 2023-11-17 — End: 2023-11-18

## 2023-11-17 MED ORDER — PRENATAL MULTIVITAMIN CH
1.0000 | ORAL_TABLET | Freq: Every day | ORAL | Status: DC
  Administered 2023-11-17 – 2023-11-18 (×2): 1 via ORAL
  Filled 2023-11-17 (×2): qty 1

## 2023-11-17 MED ORDER — SENNOSIDES-DOCUSATE SODIUM 8.6-50 MG PO TABS
2.0000 | ORAL_TABLET | Freq: Every day | ORAL | Status: DC
  Administered 2023-11-17 – 2023-11-18 (×2): 2 via ORAL
  Filled 2023-11-17 (×2): qty 2

## 2023-11-17 MED ORDER — NIFEDIPINE ER OSMOTIC RELEASE 30 MG PO TB24
30.0000 mg | ORAL_TABLET | Freq: Every day | ORAL | Status: DC
  Administered 2023-11-17 – 2023-11-18 (×3): 30 mg via ORAL
  Filled 2023-11-17 (×3): qty 1

## 2023-11-17 MED ORDER — BENZOCAINE-MENTHOL 20-0.5 % EX AERO
1.0000 | INHALATION_SPRAY | CUTANEOUS | Status: DC | PRN
  Filled 2023-11-17: qty 56

## 2023-11-17 NOTE — Anesthesia Postprocedure Evaluation (Signed)
Anesthesia Post Note  Patient: Rebekah Rhodes  Procedure(s) Performed: AN AD HOC LABOR EPIDURAL     Patient location during evaluation: Mother Baby Anesthesia Type: Epidural Level of consciousness: awake and alert Pain management: pain level controlled Vital Signs Assessment: post-procedure vital signs reviewed and stable Respiratory status: spontaneous breathing, nonlabored ventilation and respiratory function stable Cardiovascular status: stable Postop Assessment: no headache, no backache, epidural receding, no apparent nausea or vomiting, patient able to bend at knees, able to ambulate and adequate PO intake Anesthetic complications: no   No notable events documented.  Last Vitals:  Vitals:   11/17/23 0215 11/17/23 0632  BP: 124/77 114/60  Pulse: 66 70  Resp: 18 16  Temp: 37.2 C 36.8 C  SpO2: 100% 100%    Last Pain:  Vitals:   11/17/23 0719  TempSrc:   PainSc: 6    Pain Goal:                   Laban Emperor

## 2023-11-17 NOTE — Plan of Care (Signed)

## 2023-11-17 NOTE — Clinical Social Work Maternal (Signed)
CLINICAL SOCIAL WORK MATERNAL/CHILD NOTE  Patient Details  Name: Rebekah Rhodes MRN: [id number] Date of Birth: Mar 22, 1996  Date:  11/17/2023  Clinical Social Worker Initiating Note:  Enos Fling Date/Time: Initiated:  11/17/23/1344     Child's Name:  Rebekah Rhodes   Biological Parents:  Mother, Father Rebekah Rhodes January 17, 1996 [place] Rebekah Rhodes 12[birth year]25-1996)   Need for Interpreter:  None   Reason for Referral:  Current Substance Use/Substance Use During Pregnancy  , Behavioral Health Concerns   Address:  [address] [place] [address]    Phone number:  [phone] (home)     Additional phone number:   Household Members/Support Persons (HM/SP):   Household Member/Support Person 1   HM/SP Name Relationship DOB or Age  HM/SP -1   MOB's mom    HM/SP -2        HM/SP -3        HM/SP -4        HM/SP -5        HM/SP -6        HM/SP -7        HM/SP -8          Natural Supports (not living in the home):  Immediate Family, Parent   Professional Supports: None   Employment: Full-time   Type of Work: [employer]   Education:  Some Automotive engineer   Homebound arranged:    Surveyor, quantity Resources:  Medicaid   Other Resources:  Sales executive  , WIC   Cultural/Religious Considerations Which May Impact Care:    Strengths:  Home prepared for child  , Merchandiser, retail, Ability to meet basic needs  , Understanding of illness   Psychotropic Medications:         Pediatrician:       Pediatrician List: Support has given a Barrister's clerk    [place]      Pediatrician Fax Number:    Risk Factors/Current Problems:  Substance Use  , Mental Health Concerns     Cognitive State:  Able to Concentrate  , Alert  , Linear Thinking  , Insightful  , Goal Oriented     Mood/Affect:  Calm  , Comfortable  , Interested  , Relaxed     CSW Assessment: CSW received a consult for THC use at the  beginning of pregnancy, anxiety and depression. CSW met MOB at bedside to complete a full psychosocial assessment. CSW entered the room, introduced herself and acknowledged that her family was present. CSW asked MOB for privacy could the guest step out for the assessment; MOB was agreeable and the guest stepped out. CSW explained her role and the reason for the visit. MOB was polite, easy to engage, receptive to meeting with CSW, and appeared forthcoming.  CSW collected MOB's demographic information and inquired about her mental health history. MOB reported being diagnosed with depression at a early age and PTSD sometime later. MOB reported  in the past being hospitalized due to her mental health at [facility] long and was admitted for 3 days. MOB denied being prescribed medication; however did participate in therapy in the past. MOB reported coping skills which included sleeping, listening to music, watching TV and talking with friends and family. MOB requested therapy resources; CSW provided MOB with therapy resources in [place] for [place]. CSW provided education regarding the baby blues period vs.  perinatal mood disorders, discussed treatment and gave resources for mental health follow up if concerns arise.  CSW recommends self-evaluation during the postpartum time period using the New Mom Checklist from Postpartum Progress and encouraged MOB to contact a medical professional if symptoms are noted at any time. CSW assessed for safety with MOB SI/HI/DV;MOB denied all.  CSW asked MOB does she receive support resources; MOB said yes(WIC and food stamps).  MOB reported having all essential items for the infant including a carseat, bassinet and crib for safe sleeping. CSW provided review of Sudden Infant Death Syndrome (SIDS) precautions.  CSW informed MOB due to [facility] during her pregnancy; the hospital will perform a UDS and CDS on the infant. If the screenings return with positive results a report to CPS  will be made; MOB was understanding.  MOB reported her last use of THC was in October 2024 due to being nauseous during her pregnancy. MOB reported prior to pregnancy she has used [facility] for "sometime" MOB denied use of illicit substances.  The infant's urine and cord are actively being collected; and CSW will continue to monitor the results and make a CPS report if warranted.  CSW Plan/Description:  No Further Intervention Required/No Barriers to Discharge, Sudden Infant Death Syndrome (SIDS) Education, Perinatal Mood and Anxiety Disorder (PMADs) Education, Hospital Drug Screen Policy Information, Other Information/Referral to [facility], CSW Will Continue to Monitor Umbilical Cord Tissue Drug Screen Results and Make Report if Joanie Coddington, LCSW 11/17/2023, 1:46 PM

## 2023-11-17 NOTE — Lactation Note (Signed)
This note was copied from a baby's chart. Lactation Consultation Note  Patient Name: Rebekah Rhodes [id number] Date: 11/17/2023 Age:29 hours  Mom is formula feeding.   Maternal Data    Feeding    LATCH Score                    Lactation Tools Discussed/Used    Interventions    Discharge    Consult Status Consult Status: Complete    Rebekah Rhodes G 11/17/2023, 1:31 AM

## 2023-11-17 NOTE — Progress Notes (Signed)
PPD# 1 SVD w/ 1st degree Information for the patient's newborn:  Hisae, Cougill [[id number]]  female  Baby Name Journi  S:   Reports feeling good Tolerating PO fluid and solids No nausea or vomiting Bleeding is light Pain controlled with acetaminophen and ibuprofen (OTC) Up ad lib / ambulatory / voiding w/o difficulty Feeding: Bottle    O:   VS: BP 122/84   Pulse 93   Temp 98.2 F (36.8 C) (Oral)   Resp 18   Ht 5\' 7"  (1.702 m)   Wt 75.1 kg   LMP 02/16/2023   SpO2 100%   Breastfeeding Unknown   BMI 25.92 kg/m   LABS:  Recent Labs    11/17/23 0018 11/17/23 0513  WBC 10.3 10.5  HGB 10.1* 8.8*  PLT 220 177   Blood type: --/--/O POS (01/16 1026) Rubella:                        I&O: Intake/Output      01/16 0701 01/17 0700 01/17 0701 01/18 0700   Urine (mL/kg/hr) 1600    Blood 200    Total Output 1800    Net -1800           Physical Exam: Alert and oriented X3 Lungs: Clear and unlabored Heart: regular rate and rhythm / no mumurs Abdomen: soft, non-tender, non-distended  Fundus: firm, non-tender, U-3 Perineum: approximated, healing Lochia: appropriate Extremities: no edema, negative for calf pain, tenderness, or cords    A:  PPD # 1  Normal exam  P:  Routine postpartum orders Anticipate D/C on PP day 2 Plan reviewed w/ Dr. Evorn Gong, DNP, CNM 11/17/2023, 12:43 PM

## 2023-11-18 ENCOUNTER — Encounter (HOSPITAL_COMMUNITY): Payer: Self-pay | Admitting: Obstetrics and Gynecology

## 2023-11-18 DIAGNOSIS — O133 Gestational [pregnancy-induced] hypertension without significant proteinuria, third trimester: Secondary | ICD-10-CM | POA: Diagnosis present

## 2023-11-18 DIAGNOSIS — D509 Iron deficiency anemia, unspecified: Secondary | ICD-10-CM | POA: Diagnosis present

## 2023-11-18 MED ORDER — NIFEDIPINE ER 30 MG PO TB24: 30.0000 mg | ORAL_TABLET | Freq: Every day | ORAL | 0 refills | Status: AC

## 2023-11-18 MED ORDER — ACETAMINOPHEN 325 MG PO TABS: 650.0000 mg | ORAL_TABLET | ORAL | Status: AC | PRN

## 2023-11-18 MED ORDER — IBUPROFEN 600 MG PO TABS: 600.0000 mg | ORAL_TABLET | Freq: Four times a day (QID) | ORAL | 0 refills | Status: AC

## 2023-11-18 NOTE — Discharge Summary (Signed)
Postpartum Discharge Summary  Date of Service updated 11/18/23     Patient Name: Rebekah Rhodes DOB: 04/27/1996 MRN: [id number]  Date of admission: 11/16/2023 Delivery date:11/16/2023 Delivering provider: Jaymes Graff Date of discharge: 11/18/2023  Admitting diagnosis: Labor abnormal [O62.9] Intrauterine pregnancy: [redacted]w[redacted]d     Secondary diagnosis:  Principal Problem:   Labor abnormal Active Problems:   SVD (spontaneous vaginal delivery)   Gestational hypertension w/o significant proteinuria in 3rd trimester   IDA (iron deficiency anemia)  Additional problems: none    Discharge diagnosis: Term Pregnancy Delivered and Gestational Hypertension                                              Post partum procedures: none Augmentation: AROM Complications: None  Hospital course: Onset of Labor With Vaginal Delivery      28 y.o. yo G2P1011 at [redacted]w[redacted]d was admitted in Active Labor on 11/16/2023. Labor course was complicated byelevated blood pressures without proteinuria and with normal labs.   Membrane Rupture Time/Date: 5:07 PM,11/16/2023  Delivery Method:Vaginal, Spontaneous Operative Delivery:N/A Episiotomy: None Lacerations:  1st degree;Perineal Patient had a postpartum course was uncomplicated. Pt was seen by clinical social work for [facility] use in pregnancy and no barriers to discharge were identified. Blood pressures were-well controlled with Procardia 30 mg PO daily.  She is ambulating, tolerating a regular diet, passing flatus, and urinating well. Patient is discharged home in stable condition on 11/18/23.  Newborn Data: Birth date:11/16/2023 Birth time:10:33 PM Gender:Female Living status:Living Apgars:8 ,9  Weight:3490 g  Magnesium Sulfate received: No BMZ received: No Rhophylac:N/A MMR:N/A T-DaP: decline Flu: No RSV Vaccine received: No Transfusion:No Immunizations administered: Immunization History  Administered Date(s) Administered   DTaP 11/24/1995, 01/09/1996,  03/07/1996, 09/12/1996, 10/21/1999   HIB, Unspecified 11/24/1995, 01/09/1996, 03/07/1996, 09/12/1996   HPV Quadrivalent 07/17/2007, 08/13/2008, 12/17/2009   Hepatitis A, Ped/Adol-2 Dose 01/15/2007, 07/17/2007   Hepatitis B, PED/ADOLESCENT 05/30/1995, 10/06/1995, 03/07/1996   IPV 11/24/1995, 01/09/1996, 09/12/1996, 10/21/1999   MMR 09/12/1996, 10/21/1999   Meningococcal Acwy, Unspecified 07/17/2007   Meningococcal polysaccharide vaccine (MPSV4) 10/04/2012   Tdap 01/15/2007   Varicella 08/12/1997, 01/15/2007    Physical exam  Vitals:   11/17/23 0632 11/17/23 1030 11/17/23 1430 11/17/23 2107  BP: 114/60 122/84 111/63 114/87  Pulse: 70 93 68 84  Resp: 16 18 18 16   Temp: 98.3 F (36.8 C) 98.2 F (36.8 C) 98.4 F (36.9 C) 98.3 F (36.8 C)  TempSrc: Oral Oral Oral Oral  SpO2: 100% 100% 99% 100%  Weight:      Height:       General: alert, cooperative, and no distress Lochia: appropriate Uterine Fundus: firm Incision: N/A DVT Evaluation: No evidence of DVT seen on physical exam. No cords or calf tenderness. No significant calf/ankle edema. Labs: Lab Results  Component Value Date   WBC 10.5 11/17/2023   HGB 8.8 (L) 11/17/2023   HCT 26.6 (L) 11/17/2023   MCV 81.8 11/17/2023   PLT 177 11/17/2023      Latest Ref Rng & Units 11/16/2023   10:25 AM  CMP  Glucose 70 - 99 mg/dL 76   BUN 6 - 20 mg/dL 7   Creatinine 4.09 - 8.11 mg/dL 9.14   Sodium 782 - 956 mmol/L 137   Potassium 3.5 - 5.1 mmol/L 3.4   Chloride 98 - 111 mmol/L 107   CO2  22 - 32 mmol/L 21   Calcium 8.9 - 10.3 mg/dL 8.8   Total Protein 6.5 - 8.1 g/dL 6.5   Total Bilirubin 0.0 - 1.2 mg/dL 0.6   Alkaline Phos 38 - 126 U/L 268   AST 15 - 41 U/L 18   ALT 0 - 44 U/L 11    Edinburgh Score:    11/17/2023    1:02 AM  Edinburgh Postnatal Depression Scale Screening Tool  I have been able to laugh and see the funny side of things. 0  I have looked forward with enjoyment to things. 0  I have blamed myself  unnecessarily when things went wrong. 2  I have been anxious or worried for no good reason. 2  I have felt scared or panicky for no good reason. 0  Things have been getting on top of me. 2  I have been so unhappy that I have had difficulty sleeping. 0  I have felt sad or miserable. 1  I have been so unhappy that I have been crying. 1  The thought of harming myself has occurred to me. 0  Edinburgh Postnatal Depression Scale Total 8      After visit meds:  Allergies as of 11/18/2023       Reactions   Amoxicillin Hives, Diarrhea, Nausea And Vomiting   Childhood allergy Has patient had a PCN reaction causing immediate rash, facial/tongue/throat swelling, SOB or lightheadedness with hypotension: Yes Has patient had a PCN reaction causing severe rash involving mucus membranes or skin necrosis: No Has patient had a PCN reaction that required hospitalization: No Has patient had a PCN reaction occurring within the last 10 years: No If all of the above answers are "NO", then may proceed with Cephalosporin use.   Penicillins Hives, Diarrhea, Nausea And Vomiting   Has patient had a PCN reaction causing immediate rash, facial/tongue/throat swelling, SOB or lightheadedness with hypotension: Yes Has patient had a PCN reaction causing severe rash involving mucus membranes or skin necrosis: No Has patient had a PCN reaction that required hospitalization No Has patient had a PCN reaction occurring within the last 10 years: No If all of the above answers are "NO", then may proceed with Cephalosporin        Medication List     STOP taking these medications    Doxylamine-Pyridoxine 10-10 MG Tbec   ferrous sulfate 325 (65 FE) MG EC tablet   ondansetron 4 MG disintegrating tablet Commonly known as: ZOFRAN-ODT   propylthiouracil 50 MG tablet Commonly known as: PTU       TAKE these medications    acetaminophen 325 MG tablet Commonly known as: Tylenol Take 2 tablets (650 mg total) by  mouth every 4 (four) hours as needed (for pain scale < 4).   ibuprofen 600 MG tablet Commonly known as: ADVIL Take 1 tablet (600 mg total) by mouth every 6 (six) hours.   NIFEdipine 30 MG 24 hr tablet Commonly known as: ADALAT CC Take 1 tablet (30 mg total) by mouth daily.         Discharge home in stable condition Infant Feeding:  formula Infant Disposition:home with mother Discharge instruction: per After Visit Summary and Postpartum booklet. Activity: Advance as tolerated. Pelvic rest for 6 weeks.  Diet: low salt diet Anticipated Birth Control:  declines Postpartum Appointment:6 weeks Additional Postpartum F/U: BP check 1 week Future Appointments:No future appointments. Follow up Visit:  Follow-up Information     [facility]. Schedule an appointment  as soon as possible for a visit in 6 day(s).   Specialty: Obstetrics and Gynecology Contact information: [address] [place] [address] [phone]                    11/18/2023 Roma Schanz, CNM

## 2023-11-20 LAB — SURGICAL PATHOLOGY

## 2023-11-28 ENCOUNTER — Telehealth (HOSPITAL_COMMUNITY): Payer: Self-pay

## 2023-11-28 NOTE — Telephone Encounter (Signed)
11/28/2023 1611  Name: Rebekah Rhodes MRN: [id number] DOB: [birth year]  Reason for Call:  Transition of Care Hospital Discharge Call  Contact Status: Patient Contact Status: Message  Language assistant needed:          Follow-Up Questions:    Inocente Salles Postnatal Depression Scale:  In the Past 7 Days:    PHQ2-9 Depression Scale:     Discharge Follow-up:    Post-discharge interventions: NA  Signature  Signe Colt

## 2023-11-29 ENCOUNTER — Inpatient Hospital Stay (HOSPITAL_COMMUNITY)
Admission: AD | Admit: 2023-11-29 | Discharge: 2023-11-30 | Disposition: A | Discharge status: Home / Self Care | Payer: Medicaid Other | Attending: Obstetrics and Gynecology | Admitting: Obstetrics and Gynecology

## 2023-11-29 DIAGNOSIS — O9089 Other complications of the puerperium, not elsewhere classified: Secondary | ICD-10-CM | POA: Diagnosis not present

## 2023-11-29 DIAGNOSIS — R030 Elevated blood-pressure reading, without diagnosis of hypertension: Secondary | ICD-10-CM | POA: Diagnosis present

## 2023-11-29 DIAGNOSIS — O165 Unspecified maternal hypertension, complicating the puerperium: Secondary | ICD-10-CM | POA: Insufficient documentation

## 2023-11-29 DIAGNOSIS — R519 Headache, unspecified: Secondary | ICD-10-CM | POA: Insufficient documentation

## 2023-11-29 DIAGNOSIS — R110 Nausea: Secondary | ICD-10-CM | POA: Insufficient documentation

## 2023-11-29 LAB — URINALYSIS, ROUTINE W REFLEX MICROSCOPIC
Bacteria, UA: NONE SEEN
Bilirubin Urine: NEGATIVE
Glucose, UA: NEGATIVE mg/dL
Ketones, ur: NEGATIVE mg/dL
Nitrite: NEGATIVE
Protein, ur: 30 mg/dL — AB
Specific Gravity, Urine: 1.021 (ref 1.005–1.030)
WBC, UA: >50 WBC/hpf (ref 0–5)
pH: 7 (ref 5.0–8.0)

## 2023-11-29 LAB — COMPREHENSIVE METABOLIC PANEL
ALT: 14 U/L (ref 0–44)
AST: 20 U/L (ref 15–41)
Albumin: 3.5 g/dL (ref 3.5–5.0)
Alkaline Phosphatase: 130 U/L — ABNORMAL HIGH (ref 38–126)
Anion gap: 9 (ref 5–15)
BUN: 10 mg/dL (ref 6–20)
CO2: 24 mmol/L (ref 22–32)
Calcium: 8.9 mg/dL (ref 8.9–10.3)
Chloride: 106 mmol/L (ref 98–111)
Creatinine, Ser: 0.62 mg/dL (ref 0.44–1.00)
GFR, Estimated: >60 mL/min (ref >60)
Glucose, Bld: 91 mg/dL (ref 70–99)
Potassium: 3.7 mmol/L (ref 3.5–5.1)
Sodium: 139 mmol/L (ref 135–145)
Total Bilirubin: 0.5 mg/dL (ref 0.0–1.2)
Total Protein: 6.8 g/dL (ref 6.5–8.1)

## 2023-11-29 LAB — CBC
HCT: 36.8 % (ref 36.0–46.0)
Hemoglobin: 12 g/dL (ref 12.0–15.0)
MCH: 26.9 pg (ref 26.0–34.0)
MCHC: 32.6 g/dL (ref 30.0–36.0)
MCV: 82.5 fL (ref 80.0–100.0)
Platelets: 348 10*3/uL (ref 150–400)
RBC: 4.46 MIL/uL (ref 3.87–5.11)
RDW: 13.7 % (ref 11.5–15.5)
WBC: 9.6 10*3/uL (ref 4.0–10.5)
nRBC: 0 % (ref 0.0–0.2)

## 2023-11-29 MED ORDER — METOCLOPRAMIDE HCL 10 MG PO TABS
10.0000 mg | ORAL_TABLET | Freq: Once | ORAL | Status: AC
  Administered 2023-11-29: 10 mg via ORAL
  Filled 2023-11-29: qty 1

## 2023-11-29 MED ORDER — ACETAMINOPHEN-CAFFEINE 500-65 MG PO TABS
2.0000 | ORAL_TABLET | Freq: Once | ORAL | Status: AC
Start: 2023-11-29 — End: 2023-11-29
  Administered 2023-11-29: 2 via ORAL
  Filled 2023-11-29: qty 2

## 2023-11-29 MED ORDER — DIPHENHYDRAMINE HCL 25 MG PO CAPS
25.0000 mg | ORAL_CAPSULE | Freq: Once | ORAL | Status: AC
  Administered 2023-11-29: 25 mg via ORAL
  Filled 2023-11-29: qty 1

## 2023-11-29 MED ORDER — ONDANSETRON 4 MG PO TBDP
4.0000 mg | ORAL_TABLET | Freq: Once | ORAL | Status: DC
  Filled 2023-11-29: qty 1

## 2023-11-29 NOTE — MAU Note (Signed)
..  Rebekah Rhodes is a 28 y.o. at Unknown here in MAU reporting: had elevated blood pressure at home of 140/98. Has had a headache all day, took ibuprofen at 1600 but it did not help.  Denies visual changes or visual changes.  Was sent home with procardia, took dose today.  Pain score: 10/10 Vitals:   11/29/23 2141  BP: (!) 135/98  Pulse: 98  Resp: 17  Temp: 98.3 F (36.8 C)  SpO2: 100%

## 2023-11-29 NOTE — MAU Provider Note (Signed)
Chief Complaint:  No chief complaint on file.   Event Date/Time   First Provider Initiated Contact with Patient 11/29/23 2154       HPI: Rebekah Rhodes is a 28 y.o. G2P1011 who is 2 weeks postpartum presents to maternity admissions reporting elevated BP and headache.  States took ibuprofen at 4p but it did not relieve headache. Was recently started on Procardia XL 30mg  for hypertension. . She denies visual changes,  n/v, or fever/chills.    Headache  This is a new problem. The current episode started today. The problem occurs constantly. The problem has been unchanged. The pain is located in the Frontal region. The pain does not radiate. Pertinent negatives include no abdominal pain, back pain, blurred vision, dizziness, fever or nausea. Nothing aggravates the symptoms. She has tried NSAIDs for the symptoms. The treatment provided no relief. Her past medical history is significant for hypertension.  Hypertension This is a recurrent problem. The problem has been waxing and waning since onset. Associated symptoms include headaches. Pertinent negatives include no blurred vision. Treatments tried: Procardia XL 30mg .   RN Note: Rebekah Rhodes is a 28 y.o. at Unknown here in MAU reporting: had elevated blood pressure at home of 140/98. Has had a headache all day, took ibuprofen at 1600 but it did not help.  Denies visual changes or visual changes.  Was sent home with procardia, took dose today.  Pain score: 10/10  Past Medical History: Past Medical History:  Diagnosis Date   Anxiety    Asthma    Bacterial vaginitis 05/16/2018   Chlamydia    Cutaneous candidiasis 05/16/2018   Depression    was rx meds, has not picked them up   Dysmenorrhea 04/17/2023   tx with Depo Provera     Eczema    Genital herpes    Last OB 3-4y ago no S/Sx today 11/16/23   Graves disease    Not taking meds   HA (headache)    Infection    UTI   Migraine 03/10/2014   Vaginal irritation 05/16/2018    Past  obstetric history: OB History  Gravida Para Term Preterm AB Living  2 1 1  0 1 1  SAB IAB Ectopic Multiple Live Births  0 1 0 0 1    # Outcome Date GA Lbr Len/2nd Weight Sex Type Anes PTL Lv  2 Term 11/16/23 [redacted]w[redacted]d 13:15 / 00:48 3490 g F Vag-Spont EPI  LIV  1 IAB             Past Surgical History: Past Surgical History:  Procedure Laterality Date   NO PAST SURGERIES     None     WISDOM TOOTH EXTRACTION      Family History: Family History  Problem Relation Age of Onset   Breast cancer Mother    Cancer Maternal Grandmother        breast; also great grandma    Social History: Social History   Tobacco Use   Smoking status: Never   Smokeless tobacco: Never  Vaping Use   Vaping status: Never Used  Substance Use Topics   Alcohol use: No   Drug use: Not Currently    Frequency: 7.0 times per week    Types: Marijuana    Comment: last used 2 weeks ago    Allergies:  Allergies  Allergen Reactions   Amoxicillin Hives, Diarrhea and Nausea And Vomiting    Childhood allergy Has patient had a PCN reaction causing immediate rash, facial/tongue/throat  swelling, SOB or lightheadedness with hypotension: Yes Has patient had a PCN reaction causing severe rash involving mucus membranes or skin necrosis: No Has patient had a PCN reaction that required hospitalization: No Has patient had a PCN reaction occurring within the last 10 years: No If all of the above answers are "NO", then may proceed with Cephalosporin use.   Penicillins Hives, Diarrhea and Nausea And Vomiting    Has patient had a PCN reaction causing immediate rash, facial/tongue/throat swelling, SOB or lightheadedness with hypotension: Yes Has patient had a PCN reaction causing severe rash involving mucus membranes or skin necrosis: No Has patient had a PCN reaction that required hospitalization No Has patient had a PCN reaction occurring within the last 10 years: No If all of the above answers are "NO", then may proceed  with Cephalosporin    Meds:  Medications Prior to Admission  Medication Sig Dispense Refill Last Dose/Taking   acetaminophen (TYLENOL) 325 MG tablet Take 2 tablets (650 mg total) by mouth every 4 (four) hours as needed (for pain scale < 4).      ibuprofen (ADVIL) 600 MG tablet Take 1 tablet (600 mg total) by mouth every 6 (six) hours. 30 tablet 0    NIFEdipine (ADALAT CC) 30 MG 24 hr tablet Take 1 tablet (30 mg total) by mouth daily. 30 tablet 0     I have reviewed patient's Past Medical Hx, Surgical Hx, Family Hx, Social Hx, medications and allergies.  ROS:  Review of Systems  Constitutional:  Negative for fever.  Eyes:  Negative for blurred vision.  Gastrointestinal:  Negative for abdominal pain and nausea.  Musculoskeletal:  Negative for back pain.  Neurological:  Positive for headaches. Negative for dizziness.   Other systems negative     Physical Exam  Patient Vitals for the past 24 hrs:  BP Temp Temp src Pulse Resp SpO2 Height Weight  11/29/23 2141 (!) 135/98 98.3 F (36.8 C) Oral 98 17 100 % 5\' 7"  (1.702 m) 64.9 kg   Vitals:   11/29/23 2141 11/29/23 2200 11/29/23 2215 11/29/23 2230  BP: (!) 135/98 (!) 131/95 (!) 129/95 116/87   11/29/23 2245 11/29/23 2300 11/29/23 2315 11/30/23 0052  BP: 120/78 117/79 121/78 128/88   11/30/23 0059 11/30/23 0130  BP: 122/89 133/85    Constitutional: Well-developed, well-nourished female in no acute distress.  Cardiovascular: normal rate  Respiratory: normal effort, no distress. GI: Abd soft, non-tender.  Nondistended.  No rebound, No guarding.  Bowel Sounds audible  MS: Extremities nontender, no edema, normal ROM Neurologic: Alert and oriented x 4.   Grossly nonfocal. GU: Neg CVAT. Skin:  Warm and Dry Psych:  Affect appropriate.   Labs: Results for orders placed or performed during the hospital encounter of 11/29/23 (from the past 24 hours)  CBC     Status: None   Collection Time: 11/29/23  9:07 PM  Result Value Ref Range    WBC 9.6 4.0 - 10.5 K/uL   RBC 4.46 3.87 - 5.11 MIL/uL   Hemoglobin 12.0 12.0 - 15.0 g/dL   HCT 16.1 09.6 - 04.5 %   MCV 82.5 80.0 - 100.0 fL   MCH 26.9 26.0 - 34.0 pg   MCHC 32.6 30.0 - 36.0 g/dL   RDW 40.9 81.1 - 91.4 %   Platelets 348 150 - 400 K/uL   nRBC 0.0 0.0 - 0.2 %  Comprehensive metabolic panel     Status: Abnormal   Collection Time: 11/29/23  9:07 PM  Result Value Ref Range   Sodium 139 135 - 145 mmol/L   Potassium 3.7 3.5 - 5.1 mmol/L   Chloride 106 98 - 111 mmol/L   CO2 24 22 - 32 mmol/L   Glucose, Bld 91 70 - 99 mg/dL   BUN 10 6 - 20 mg/dL   Creatinine, Ser 4.78 0.44 - 1.00 mg/dL   Calcium 8.9 8.9 - 29.5 mg/dL   Total Protein 6.8 6.5 - 8.1 g/dL   Albumin 3.5 3.5 - 5.0 g/dL   AST 20 15 - 41 U/L   ALT 14 0 - 44 U/L   Alkaline Phosphatase 130 (H) 38 - 126 U/L   Total Bilirubin 0.5 0.0 - 1.2 mg/dL   GFR, Estimated >62 >13 mL/min   Anion gap 9 5 - 15  Urinalysis, Routine w reflex microscopic -Urine, Clean Catch     Status: Abnormal   Collection Time: 11/29/23  9:38 PM  Result Value Ref Range   Color, Urine YELLOW YELLOW   APPearance CLOUDY (A) CLEAR   Specific Gravity, Urine 1.021 1.005 - 1.030   pH 7.0 5.0 - 8.0   Glucose, UA NEGATIVE NEGATIVE mg/dL   Hgb urine dipstick LARGE (A) NEGATIVE   Bilirubin Urine NEGATIVE NEGATIVE   Ketones, ur NEGATIVE NEGATIVE mg/dL   Protein, ur 30 (A) NEGATIVE mg/dL   Nitrite NEGATIVE NEGATIVE   Leukocytes,Ua LARGE (A) NEGATIVE   RBC / HPF 6-10 0 - 5 RBC/hpf   WBC, UA >50 0 - 5 WBC/hpf   Bacteria, UA NONE SEEN NONE SEEN   Squamous Epithelial / HPF 6-10 0 - 5 /HPF   Mucus PRESENT     --/--/O POS (01/16 1026)  Imaging:    MAU Course/MDM: I have reviewed the triage vital signs and the nursing notes.   Pertinent labs & imaging results that were available during my care of the patient were reviewed by me and considered in my medical decision making (see chart for details).      I have reviewed her medical records  including past results, notes and treatments.   I have ordered labs as follows:  CBC and CMET.  Normal platelets, normal transaminases Imaging ordered: none Results reviewed.   Consult Dr Vergie Living.   Treatments in MAU included Excedrin Tension, Reglan and Benadryl   Headache did improve with these meds. .   Pt stable at time of discharge.  Assessment: Postpartum Hypertension Headache, improved Nausea  Plan: Discharge home Recommend Watch for signs of worsening headache or other preeclampsia symptoms Rx sent for Zofran for nausea Continue Procardia as prescribed Followup in office (states does not have a BP check in the near future. States appt is late February.  Recommend call office Encouraged to return here or to other Urgent Care/ED if she develops worsening of symptoms, increase in pain, fever, or other concerning symptoms.   Wynelle Bourgeois CNM, MSN Certified Nurse-Midwife 11/29/2023 9:54 PM

## 2023-11-30 DIAGNOSIS — O9089 Other complications of the puerperium, not elsewhere classified: Secondary | ICD-10-CM

## 2023-11-30 DIAGNOSIS — O165 Unspecified maternal hypertension, complicating the puerperium: Secondary | ICD-10-CM

## 2023-11-30 DIAGNOSIS — R110 Nausea: Secondary | ICD-10-CM

## 2023-11-30 DIAGNOSIS — R519 Headache, unspecified: Secondary | ICD-10-CM

## 2023-11-30 MED ORDER — ONDANSETRON 4 MG PO TBDP: 4.0000 mg | ORAL_TABLET | Freq: Three times a day (TID) | ORAL | 0 refills | Status: AC | PRN

## 2023-12-02 ENCOUNTER — Telehealth (HOSPITAL_COMMUNITY): Payer: Self-pay

## 2023-12-02 NOTE — Telephone Encounter (Signed)
12/02/2023 1117  Name: NATALIE MCEUEN MRN: [id number] DOB: 1995-11-14  Reason for Call:  Transition of Care Hospital Discharge Call  Contact Status: Patient Contact Status: Message Patient left RN a voicemail to call her. RN attempting to call patient back, voicemail left.  Language assistant needed:          Follow-Up Questions:    Inocente Salles Postnatal Depression Scale:  In the Past 7 Days:    PHQ2-9 Depression Scale:     Discharge Follow-up:    Post-discharge interventions: NA  Signature  Signe Colt

## 2024-05-08 ENCOUNTER — Ambulatory Visit (INDEPENDENT_AMBULATORY_CARE_PROVIDER_SITE_OTHER): Admitting: "Endocrinology

## 2024-05-08 ENCOUNTER — Encounter: Payer: Self-pay | Admitting: "Endocrinology

## 2024-05-08 VITALS — BP 120/70 | HR 131 | Ht 67.0 in | Wt 110.0 lb

## 2024-05-08 DIAGNOSIS — E049 Nontoxic goiter, unspecified: Secondary | ICD-10-CM

## 2024-05-08 DIAGNOSIS — E0500 Thyrotoxicosis with diffuse goiter without thyrotoxic crisis or storm: Secondary | ICD-10-CM | POA: Diagnosis not present

## 2024-05-08 MED ORDER — METHIMAZOLE 10 MG PO TABS: 10.0000 mg | ORAL_TABLET | Freq: Two times a day (BID) | ORAL | 3 refills | Status: AC

## 2024-05-08 NOTE — Progress Notes (Signed)
 Outpatient Endocrinology Note Rebekah Birmingham, MD  05/08/24   Rebekah Rhodes [birth year] [id number]  Referring Provider: No ref. provider found Primary Care Provider: Patient, No Pcp Per Subjective  No chief complaint on file.   Assessment & Plan  Diagnoses and all orders for this visit:  Graves disease -     TSH -     T3, free -     T4, free -     US  THYROID ; Future  Goiter -     US  THYROID ; Future  Other orders -     methimazole  (TAPAZOLE ) 10 MG tablet; Take 1 tablet (10 mg total) by mouth 2 (two) times daily.     Rebekah Rhodes is currently not taking any PTU.  She was last on propylthiouracil  100 mg twice daily and she self stopped it around 06/2023 in first trimester.  Patient previously explained the diagnosis of Graves' disease at length and what it means for her and her baby. Patient understands that it can cause tachycardia and to inform her OB/GYN, the neonatologist and pediatrician timely. Explained how autoimmune conditions can run together and to let her PCP know if she experiences any new symptoms. 05/06/24: Last thyroid  labs showed suppressed TSH with elevated T4. Educated on thyroid  axis.  Recommend the following: Methimazole  10 mg bid  Repeat labs sooner if symptoms of hyper or hypothyroidism develop.  -complications of untreated hyperthyroidism including atrial fibrillation, heart failure and osteoporosis -side effects of Methimazole  including but not limited to allergic reaction, rash, bone marrow suppression, liver dysfunction and teratogenic potential -implications in pregnancy and breastfeeding -compliance and follow up needs    Thyromegaly noted  04/17/23: Thyroid  ultrasound: reported thyromegaly without nodules Reports some swallowing issues Ordered repeat thyroid  U/S    Quit smoking after she found out pregnancy Encouraged and motivated previously   If you notice any symptoms of worsening fatigue, fever with sore throat, loss of appetite,  yellowing of eyes, dark urine, joint pains, sores in the mouth, itchy rash, light colored stools or abdominal pain, please stop the medication and call us  immediately as this can be a serious side effect of the medication.   I have reviewed current medications, nurse's notes, allergies, vital signs, past medical and surgical history, family medical history, and social history for this encounter. Counseled patient on symptoms, examination findings, lab findings, imaging results, treatment decisions and monitoring and prognosis. The patient understood the recommendations and agrees with the treatment plan. All questions regarding treatment plan were fully answered.   Return in about 3 weeks (around 05/29/2024) for visit + labs before next visit.   Rebekah Birmingham, MD  05/08/24   I have reviewed current medications, nurse's notes, allergies, vital signs, past medical and surgical history, family medical history, and social history for this encounter. Counseled patient on symptoms, examination findings, lab findings, imaging results, treatment decisions and monitoring and prognosis. The patient understood the recommendations and agrees with the treatment plan. All questions regarding treatment plan were fully answered.   History of Present Illness Rebekah Rhodes is a 28 y.o. year old female who presents to our clinic with hyperthyroidism diagnosed in 2024 found on regular check up.    Symptoms suggestive of THYROID :   C/o dizzy, nauseous, weak, feeling hot, constipation   weight loss  No, no nausea/vomiting heat intolerance Yes hyperdefecation  No palpitations  Yes   Compressive symptoms:  dysphagia  Yes dysphonia  No positional dyspnea (especially with simultaneous arms elevation)  No  Smokes No On biotin  No Personal history of head/neck surgery/irradiation  No  Grave's Ophthalmopathy Clinical Activity Score: 0/9  Apr 30 FT4 4.12 TSH <0.005  04/16/2013 CLINICAL DATA:  Neck  fullness and swelling. History of grave's disease.   EXAM: THYROID  ULTRASOUND   TECHNIQUE: Ultrasound examination of the thyroid  gland and adjacent soft tissues was performed.   COMPARISON:  None Available.   FINDINGS: Parenchymal Echotexture: Moderately heterogenous   Isthmus: 0.9 cm   Right lobe: 5.5 x 2.0 x 2.0 cm   Left lobe: 5.2 x 1.9 x 2.0 cm   _________________________________________________________   Estimated total number of nodules >/= 1 cm: 0   Number of spongiform nodules >/=  2 cm not described below (TR1): 0   Number of mixed cystic and solid nodules >/= 1.5 cm not described below (TR2): 0   _________________________________________________________   No discrete nodules are seen within the thyroid  gland. The thyroid  parenchyma demonstrates subjectively increased vascularity by color Doppler. No enlarged or abnormal appearing lymph nodes are identified.   IMPRESSION: Mildly enlarged and moderately heterogeneous thyroid  gland with subjectively increased vascularity. No discrete thyroid  nodules identified.   _______  THYROID   [facility]07/04/2024 Component 05/06/2024 02/28/2023 02/28/2023         TSH <0.005 Low    <0.005 Low    -- Load older lab results  T4, Free (Direct) -- -- 4.12 High      T4, Total >24.9 High    -- --   T3 Uptake >56 High    -- --   Free Thyroxine Index         Physical Exam  BP 120/70   Pulse (!) 131   Ht 5' 7 (1.702 m)   Wt 110 lb (49.9 kg)   SpO2 98%   BMI 17.23 kg/m  Constitutional: well developed, well nourished Head: normocephalic, atraumatic, no exophthalmos Eyes: sclera anicteric, no redness Neck: + thyromegaly, no thyroid  tenderness; no nodules palpated Lungs: normal respiratory effort Neurology: alert and oriented, no fine hand tremor Skin: dry, no appreciable rashes Musculoskeletal: no appreciable defects Psychiatric: normal mood and affect  Allergies Allergies  Allergen Reactions    Amoxicillin Hives, Diarrhea and Nausea And Vomiting    Childhood allergy Has patient had a PCN reaction causing immediate rash, facial/tongue/throat swelling, SOB or lightheadedness with hypotension: Yes Has patient had a PCN reaction causing severe rash involving mucus membranes or skin necrosis: No Has patient had a PCN reaction that required hospitalization: No Has patient had a PCN reaction occurring within the last 10 years: No If all of the above answers are NO, then may proceed with Cephalosporin use.   Penicillins Hives, Diarrhea and Nausea And Vomiting    Has patient had a PCN reaction causing immediate rash, facial/tongue/throat swelling, SOB or lightheadedness with hypotension: Yes Has patient had a PCN reaction causing severe rash involving mucus membranes or skin necrosis: No Has patient had a PCN reaction that required hospitalization No Has patient had a PCN reaction occurring within the last 10 years: No If all of the above answers are NO, then may proceed with Cephalosporin    Current Medications Patient's Medications  New Prescriptions   METHIMAZOLE  (TAPAZOLE ) 10 MG TABLET    Take 1 tablet (10 mg total) by mouth 2 (two) times daily.  Previous Medications   ACETAMINOPHEN  (TYLENOL ) 325 MG TABLET    Take 2 tablets (650 mg total) by mouth every 4 (four) hours as needed (for pain scale <  4).   IBUPROFEN  (ADVIL ) 600 MG TABLET    Take 1 tablet (600 mg total) by mouth every 6 (six) hours.   NIFEDIPINE  (ADALAT  CC) 30 MG 24 HR TABLET    Take 1 tablet (30 mg total) by mouth daily.   ONDANSETRON  (ZOFRAN -ODT) 4 MG DISINTEGRATING TABLET    Take 1 tablet (4 mg total) by mouth every 8 (eight) hours as needed for nausea or vomiting.  Modified Medications   No medications on file  Discontinued Medications   No medications on file    Past Medical History Past Medical History:  Diagnosis Date   Anxiety    Asthma    Bacterial vaginitis 05/16/2018   Chlamydia    Cutaneous  candidiasis 05/16/2018   Depression    was rx meds, has not picked them up   Dysmenorrhea 04/17/2023   tx with Depo Provera     Eczema    Genital herpes    Last OB 3-4y ago no S/Sx today 11/16/23   Graves disease    Not taking meds   HA (headache)    Infection    UTI   Migraine 03/10/2014   Vaginal irritation 05/16/2018    Past Surgical History Past Surgical History:  Procedure Laterality Date   NO PAST SURGERIES     None     WISDOM TOOTH EXTRACTION      Family History family history includes Breast cancer in her mother; Cancer in her maternal grandmother.  Social History Social History   Socioeconomic History   Marital status: Single    Spouse name: Not on file   Number of children: 0   Years of education: 12   Highest education level: Not on file  Occupational History    Comment: High school student  Tobacco Use   Smoking status: Never   Smokeless tobacco: Never  Vaping Use   Vaping status: Never Used  Substance and Sexual Activity   Alcohol use: No   Drug use: Not Currently    Frequency: 7.0 times per week    Types: Marijuana    Comment: last used 2 weeks ago   Sexual activity: Yes    Birth control/protection: Implant  Other Topics Concern   Not on file  Social History Narrative   Patient lives at home with her father Rebekah Rhodes).   Patient is a high Ecologist.   Right handed.   Caffeine  soda one daily.   Social Drivers of Corporate investment banker Strain: Low Risk  (05/02/2023)   Received from [facility]   Overall Financial Resource Strain (CARDIA)    Difficulty of Paying Living Expenses: Not very hard  Recent Concern: Financial Resource Strain - High Risk (02/28/2023)   Received from [facility]   Overall Financial Resource Strain (CARDIA)    Difficulty of Paying Living Expenses: Very hard  Food Insecurity: No Food Insecurity (11/17/2023)   Hunger Vital Sign    Worried About Running Out of Food in the Last Year: Never true    Ran  Out of Food in the Last Year: Never true  Transportation Needs: No Transportation Needs (11/17/2023)   PRAPARE - Administrator, Civil Service (Medical): No    Lack of Transportation (Non-Medical): No  Physical Activity: Unknown (05/02/2023)   Received from [facility]   Exercise Vital Sign    On average, how many days per week do you engage in moderate to strenuous exercise (like a brisk walk)?: 0 days  Minutes of Exercise per Session: Not on file  Stress: No Stress Concern Present (05/02/2023)   Received from [facility] - Occupational Stress Questionnaire    Feeling of Stress : Not at all  Social Connections: Socially Integrated (05/02/2023)   Received from [facility]   Social Network    How would you rate your social network (family, work, friends)?: Good participation with social networks  Intimate Partner Violence: Not At Risk (11/17/2023)   Humiliation, Afraid, Rape, and Kick questionnaire    Fear of Current or Ex-Partner: No    Emotionally Abused: No    Physically Abused: No    Sexually Abused: No    Laboratory Investigations Lab Results  Component Value Date   TSH 2.635 11/16/2023   TSH 0.539 07/28/2023   TSH <0.01 (L) 05/03/2023   FREET4 1.3 05/03/2023   FREET4 1.35 (H) 04/17/2023   FREET4 1.92 (H) 04/10/2023     Lab Results  Component Value Date   TSI 306 (H) 03/20/2023     No components found for: TRAB   No results found for: CHOL No results found for: HDL No results found for: LDLCALC No results found for: TRIG No results found for: [facility] Lab Results  Component Value Date   CREATININE 0.62 11/29/2023   No results found for: GFR    Component Value Date/Time   NA 139 11/29/2023 2107   K 3.7 11/29/2023 2107   CL 106 11/29/2023 2107   CO2 24 11/29/2023 2107   GLUCOSE 91 11/29/2023 2107   BUN 10 11/29/2023 2107   CREATININE 0.62 11/29/2023 2107   CALCIUM 8.9 11/29/2023 2107    PROT 6.8 11/29/2023 2107   ALBUMIN 3.5 11/29/2023 2107   AST 20 11/29/2023 2107   ALT 14 11/29/2023 2107   ALKPHOS 130 (H) 11/29/2023 2107   BILITOT 0.5 11/29/2023 2107   GFRNONAA >60 11/29/2023 2107   GFRAA >60 06/22/2019 1405      Latest Ref Rng & Units 11/29/2023    9:07 PM 11/16/2023   10:25 AM 05/21/2023    2:20 PM  BMP  Glucose 70 - 99 mg/dL 91  76  89   BUN 6 - 20 mg/dL 10  7  6    Creatinine 0.44 - 1.00 mg/dL 9.37  9.35  9.46   Sodium 135 - 145 mmol/L 139  137  134   Potassium 3.5 - 5.1 mmol/L 3.7  3.4  3.6   Chloride 98 - 111 mmol/L 106  107  104   CO2 22 - 32 mmol/L 24  21  22    Calcium 8.9 - 10.3 mg/dL 8.9  8.8  8.8        Component Value Date/Time   WBC 9.6 11/29/2023 2107   RBC 4.46 11/29/2023 2107   HGB 12.0 11/29/2023 2107   HCT 36.8 11/29/2023 2107   PLT 348 11/29/2023 2107   MCV 82.5 11/29/2023 2107   MCH 26.9 11/29/2023 2107   MCHC 32.6 11/29/2023 2107   RDW 13.7 11/29/2023 2107   LYMPHSABS 1.9 04/17/2023 1132   MONOABS 0.6 04/17/2023 1132   EOSABS 0.1 04/17/2023 1132   BASOSABS 0.0 04/17/2023 1132      Parts of this note may have been dictated using voice recognition software. There may be variances in spelling and vocabulary which are unintentional. Not all errors are proofread. Please notify the dino if any discrepancies are noted or if the meaning of any statement is not  clear.

## 2024-05-08 NOTE — Patient Instructions (Signed)
  If you notice any symptoms of worsening fatigue, fever with sore throat, loss of appetite, yellowing of eyes, dark urine, joint pains, sores in the mouth, itchy rash, light colored stools or abdominal pain, please stop the medication and call us immediately as this can be a serious side effect of the medication.

## 2024-05-09 ENCOUNTER — Telehealth: Payer: Self-pay | Admitting: "Endocrinology

## 2024-05-09 NOTE — Telephone Encounter (Signed)
 Patient called and states her new medication methimazole  (TAPAZOLE ) 10 MG tablet  is making her extremely drowsy and it's concerning her with having a young child.Please advise [phone]

## 2024-05-23 ENCOUNTER — Ambulatory Visit (HOSPITAL_COMMUNITY): Admission: RE | Admit: 2024-05-23 | Source: Ambulatory Visit

## 2024-05-24 ENCOUNTER — Ambulatory Visit (HOSPITAL_COMMUNITY): Attending: "Endocrinology

## 2024-06-04 ENCOUNTER — Emergency Department (HOSPITAL_COMMUNITY)
Admission: EM | Admit: 2024-06-04 | Discharge: 2024-06-04 | Disposition: A | Discharge status: Home / Self Care | Attending: Emergency Medicine | Admitting: Emergency Medicine

## 2024-06-04 ENCOUNTER — Other Ambulatory Visit: Payer: Self-pay

## 2024-06-04 DIAGNOSIS — D509 Iron deficiency anemia, unspecified: Secondary | ICD-10-CM | POA: Diagnosis not present

## 2024-06-04 DIAGNOSIS — G43709 Chronic migraine without aura, not intractable, without status migrainosus: Secondary | ICD-10-CM | POA: Diagnosis not present

## 2024-06-04 DIAGNOSIS — R110 Nausea: Secondary | ICD-10-CM | POA: Diagnosis present

## 2024-06-04 DIAGNOSIS — E039 Hypothyroidism, unspecified: Secondary | ICD-10-CM | POA: Diagnosis not present

## 2024-06-04 LAB — CBC WITH DIFFERENTIAL/PLATELET
Abs Immature Granulocytes: 0.01 K/uL (ref 0.00–0.07)
Basophils Absolute: 0 K/uL (ref 0.0–0.1)
Basophils Relative: 1 %
Eosinophils Absolute: 0 K/uL (ref 0.0–0.5)
Eosinophils Relative: 1 %
HCT: 32.2 % — ABNORMAL LOW (ref 36.0–46.0)
Hemoglobin: 9.9 g/dL — ABNORMAL LOW (ref 12.0–15.0)
Immature Granulocytes: 0 %
Lymphocytes Relative: 53 %
Lymphs Abs: 2.9 K/uL (ref 0.7–4.0)
MCH: 24 pg — ABNORMAL LOW (ref 26.0–34.0)
MCHC: 30.7 g/dL (ref 30.0–36.0)
MCV: 78.2 fL — ABNORMAL LOW (ref 80.0–100.0)
Monocytes Absolute: 0.7 K/uL (ref 0.1–1.0)
Monocytes Relative: 13 %
Neutro Abs: 1.7 K/uL (ref 1.7–7.7)
Neutrophils Relative %: 32 %
Platelets: 197 K/uL (ref 150–400)
RBC: 4.12 MIL/uL (ref 3.87–5.11)
RDW: 13.6 % (ref 11.5–15.5)
WBC: 5.4 K/uL (ref 4.0–10.5)
nRBC: 0 % (ref 0.0–0.2)

## 2024-06-04 LAB — COMPREHENSIVE METABOLIC PANEL WITH GFR
ALT: 55 U/L — ABNORMAL HIGH (ref 0–44)
AST: 31 U/L (ref 15–41)
Albumin: 3.3 g/dL — ABNORMAL LOW (ref 3.5–5.0)
Alkaline Phosphatase: 101 U/L (ref 38–126)
Anion gap: 7 (ref 5–15)
BUN: 16 mg/dL (ref 6–20)
CO2: 24 mmol/L (ref 22–32)
Calcium: 9.2 mg/dL (ref 8.9–10.3)
Chloride: 108 mmol/L (ref 98–111)
Creatinine, Ser: 0.37 mg/dL — ABNORMAL LOW (ref 0.44–1.00)
GFR, Estimated: >60 mL/min (ref >60)
Glucose, Bld: 108 mg/dL — ABNORMAL HIGH (ref 70–99)
Potassium: 3.5 mmol/L (ref 3.5–5.1)
Sodium: 139 mmol/L (ref 135–145)
Total Bilirubin: 0.9 mg/dL (ref 0.0–1.2)
Total Protein: 6.3 g/dL — ABNORMAL LOW (ref 6.5–8.1)

## 2024-06-04 LAB — IRON AND TIBC
Iron: 77 ug/dL (ref 28–170)
Saturation Ratios: 34 % — ABNORMAL HIGH (ref 10.4–31.8)
TIBC: 224 ug/dL — ABNORMAL LOW (ref 250–450)
UIBC: 147 ug/dL

## 2024-06-04 LAB — FERRITIN: Ferritin: 70 ng/mL (ref 11–307)

## 2024-06-04 MED ORDER — KETOROLAC TROMETHAMINE 30 MG/ML IJ SOLN
30.0000 mg | Freq: Once | INTRAMUSCULAR | Status: AC
  Administered 2024-06-04: 30 mg via INTRAVENOUS
  Filled 2024-06-04: qty 1

## 2024-06-04 MED ORDER — METOCLOPRAMIDE HCL 5 MG/ML IJ SOLN
10.0000 mg | Freq: Once | INTRAMUSCULAR | Status: AC
Start: 2024-06-04 — End: 2024-06-04
  Administered 2024-06-04: 10 mg via INTRAVENOUS
  Filled 2024-06-04: qty 2

## 2024-06-04 MED ORDER — FERROUS SULFATE 325 (65 FE) MG PO TABS: 325.0000 mg | ORAL_TABLET | Freq: Every day | ORAL | 0 refills | Status: AC

## 2024-06-04 MED ORDER — VITAMIN C 250 MG PO TABS: 500.0000 mg | ORAL_TABLET | Freq: Every day | ORAL | 0 refills | Status: AC

## 2024-06-04 MED ORDER — SODIUM CHLORIDE 0.9 % IV BOLUS
1000.0000 mL | Freq: Once | INTRAVENOUS | Status: AC
Start: 2024-06-04 — End: 2024-06-04
  Administered 2024-06-04: 1000 mL via INTRAVENOUS

## 2024-06-04 NOTE — Discharge Instructions (Addendum)
 You were seen in the ER today for concerns a headache and nausea. Your labs appear to show some worsening anemia but this is likely due to not being on any iron supplementation. I have added on iron panel labs for you to follow up on with your primary care provider but will start you on an iron supplement. Please take this as prescribed. For your headaches, I would recommend following up with neurology of these headaches are becoming more persistent. Return to the ER for concerns of new or worsening symptoms.

## 2024-06-04 NOTE — ED Provider Notes (Signed)
 [place] EMERGENCY DEPARTMENT AT [facility] Provider Note   CSN: [id number] Arrival date & time: 06/04/24  [id number]     Patient presents with: Nausea and Migraine   Rebekah Rhodes is a 28 y.o. female.  Patient with past history significant for depression, hypothyroidism, iron deficiency anemia presents the emergency department with concerns of a headache.  Reports she has had ongoing headache and nausea for the last 2 days that has not improved with any of her home medications.  Has tried taking rizatriptan  and Zofran  without relief.  Denies any vomiting or diarrhea.  No reported fevers.   Migraine Associated symptoms include headaches.       Prior to Admission medications   Medication Sig Start Date End Date Taking? Authorizing Provider  ferrous sulfate  325 (65 FE) MG tablet Take 1 tablet (325 mg total) by mouth daily. 06/04/24  Yes Elih Mooney A, PA-C  vitamin C  (ASCORBIC ACID) 250 MG tablet Take 2 tablets (500 mg total) by mouth daily. 06/04/24 07/04/24 Yes Onica Davidovich A, PA-C  acetaminophen  (TYLENOL ) 325 MG tablet Take 2 tablets (650 mg total) by mouth every 4 (four) hours as needed (for pain scale < 4). 11/18/23   Grice, Vivian B, CNM  ibuprofen  (ADVIL ) 600 MG tablet Take 1 tablet (600 mg total) by mouth every 6 (six) hours. 11/18/23   Grice, Vivian B, CNM  methimazole  (TAPAZOLE ) 10 MG tablet Take 1 tablet (10 mg total) by mouth 2 (two) times daily. 05/08/24   Motwani, Komal, MD  NIFEdipine  (ADALAT  CC) 30 MG 24 hr tablet Take 1 tablet (30 mg total) by mouth daily. 11/18/23   Grice, Vivian B, CNM  ondansetron  (ZOFRAN -ODT) 4 MG disintegrating tablet Take 1 tablet (4 mg total) by mouth every 8 (eight) hours as needed for nausea or vomiting. 11/30/23   Trudy Earnie CROME, CNM    Allergies: Amoxicillin and Penicillins    Review of Systems  Gastrointestinal:  Positive for nausea.  Neurological:  Positive for headaches.  All other systems reviewed and are negative.   Updated  Vital Signs BP (!) 141/76 (BP Location: Right Arm)   Pulse 93   Temp 98.8 F (37.1 C)   Resp 19   SpO2 100%   Physical Exam Vitals and nursing note reviewed.  Constitutional:      General: She is not in acute distress.    Appearance: She is well-developed.  HENT:     Head: Normocephalic and atraumatic.  Eyes:     Conjunctiva/sclera: Conjunctivae normal.  Cardiovascular:     Rate and Rhythm: Normal rate and regular rhythm.     Heart sounds: No murmur heard. Pulmonary:     Effort: Pulmonary effort is normal. No respiratory distress.     Breath sounds: Normal breath sounds.  Abdominal:     Palpations: Abdomen is soft.     Tenderness: There is no abdominal tenderness.  Musculoskeletal:        General: No swelling.     Cervical back: Neck supple.  Skin:    General: Skin is warm and dry.     Capillary Refill: Capillary refill takes less than 2 seconds.  Neurological:     General: No focal deficit present.     Mental Status: She is alert. Mental status is at baseline.     Cranial Nerves: No cranial nerve deficit.     Motor: No weakness.     Gait: Gait normal.  Psychiatric:  Mood and Affect: Mood normal.     (all labs ordered are listed, but only abnormal results are displayed) Labs Reviewed  CBC WITH DIFFERENTIAL/PLATELET - Abnormal; Notable for the following components:      Result Value   Hemoglobin 9.9 (*)    HCT 32.2 (*)    MCV 78.2 (*)    MCH 24.0 (*)    All other components within normal limits  COMPREHENSIVE METABOLIC PANEL WITH GFR - Abnormal; Notable for the following components:   Glucose, Bld 108 (*)    Creatinine, Ser 0.37 (*)    Total Protein 6.3 (*)    Albumin 3.3 (*)    ALT 55 (*)    All other components within normal limits  FERRITIN  IRON AND TIBC    EKG: None  Radiology: No results found.   Procedures   Medications Ordered in the ED  metoCLOPramide  (REGLAN ) injection 10 mg (10 mg Intravenous Given 06/04/24 0742)  sodium chloride   0.9 % bolus 1,000 mL (0 mLs Intravenous Stopped 06/04/24 0852)  ketorolac  (TORADOL ) 30 MG/ML injection 30 mg (30 mg Intravenous Given 06/04/24 0742)                                    Medical Decision Making Amount and/or Complexity of Data Reviewed Labs: ordered.  Risk Prescription drug management.   This patient presents to the ED for concern of nausea, migraine, this involves an extensive number of treatment options, and is a complaint that carries with it a high risk of complications and morbidity.  The differential diagnosis includes migraine headache, tension hide, cluster headache, dehydration   Co morbidities that complicate the patient evaluation  Hyperthyroidism, iron deficiency anemia, major depressive disorder   Lab Tests:  I Ordered, and personally interpreted labs.  The pertinent results include: CBC with return to baseline anemia level with hemoglobin level at 9.9, added on iron and TIBC as well as ferritin, CMPunremarkable   Consultations Obtained:  I requested consultation with none,  and discussed lab and imaging findings as well as pertinent plan - they recommend: N/A   Problem List / ED Course / Critical interventions / Medication management  Patient with past history significant for hypothyroidism, iron deficiency anemia, and major depressive disorder presents the emergency department concerns of nausea and migraines.  States that she has been dealing with a low-level headache that is not clearing for the last month.  Has tried over-the-counter medications with minimal improvement.  Recently took rizatriptan  and Zofran  about an hour prior to arrival without relief.  Denies any visual loss or changes, unilateral weakness or numbness, or paresthesias. On exam, patient is well-appearing although somewhat uncomfortable.  She currently has her 38-month-old daughter with her at bedside and is interacting with her appropriately.  No significant photosensitivity seen or  observed.  No abnormal heart or lung sounds.  Otherwise unremarkable exam.  No significant pallor.  Based on history, will start migraine cocktail with Toradol , Reglan , and fluids.  Basic labs for evaluation of baseline anemia or other possible abnormalities to explain persistent headaches CBC shows hemoglobin declining at 9.9 but return to somewhat normal anemia levels.  She is not currently on any iron supplementation for iron deficiency anemia.  Added on iron and TIBC as well as ferritin studies.  CMP unremarkable. After ministration of Toradol  and Reglan , reevaluate patient an hour after medication ministration she reports that her headache has  resolved.  She is comfortable at this time with plan for outpatient follow-up.  I advised her that her iron panel studies are currently pending but she can follow-up on these results with her primary care provider.  In the meantime, we will start her on iron supplementation given that she was previously on iron supplements but has been off these for several months now.  Ferrous sulfate  325 and vitamin C  500 sent to pharmacy.  Return precautions discussed such as concerns for new or worsening symptoms.  Otherwise able for outpatient follow-up and discharged home. I ordered medication including Reglan , Toradol , fluid for headache Reevaluation of the patient after these medicines showed that the patient improved I have reviewed the patients home medicines and have made adjustments as needed   Social Determinants of Health:  6 months postpartum   Test / Admission - Considered:  Consider admission but patient stable for outpatient follow-up.  Final diagnoses:  Chronic migraine without aura without status migrainosus, not intractable  Iron deficiency anemia, unspecified iron deficiency anemia type    ED Discharge Orders          Ordered    ferrous sulfate  325 (65 FE) MG tablet  Daily        06/04/24 0837    vitamin C  (ASCORBIC ACID) 250 MG tablet   Daily        06/04/24 0837               Eryn Krejci A, PA-C 06/04/24 0901    Long, Joshua G, MD 06/06/24 1058

## 2024-06-04 NOTE — ED Triage Notes (Signed)
 Patient brought in by  [facility] from home . Per [facility] patient c/o migraine and nausea for 2 days that comes and goes for about 1 month now. Patient took rizatriptan  and Zofran  with no relief. Hx hyperthyroidism BP 138/80 128P 16R 99 room air BS 99
